# Patient Record
Sex: Male | Born: 1990 | Race: White | Hispanic: No | Marital: Single
Health system: Midwestern US, Community
[De-identification: ages and names within clinical notes are randomized; demographics above are authoritative.]

## PROBLEM LIST (undated history)

## (undated) DIAGNOSIS — F1910 Other psychoactive substance abuse, uncomplicated: Secondary | ICD-10-CM

## (undated) DIAGNOSIS — F319 Bipolar disorder, unspecified: Secondary | ICD-10-CM

## (undated) DIAGNOSIS — F1010 Alcohol abuse, uncomplicated: Secondary | ICD-10-CM

## (undated) MED FILL — FLUOXETINE HCL 20MG CAPS: 20 MG | 30 days supply | Qty: 30 | Fill #0 | Status: AC

## (undated) MED FILL — TRAZODONE HCL 150MG TABS: 150 MG | 30 days supply | Qty: 30 | Fill #0 | Status: AC

---

## 2018-07-07 ENCOUNTER — Inpatient Hospital Stay
Admission: EM | Admit: 2018-07-07 | Discharge: 2018-07-13 | Disposition: A | Discharge status: Home / Self Care | Payer: MEDICAID | Admitting: Addiction Medicine

## 2018-07-07 DIAGNOSIS — F1010 Alcohol abuse, uncomplicated: Principal | ICD-10-CM

## 2018-07-07 NOTE — ED Notes (Signed)
[facility] at bedside. Pt transported in stable condition via stretcher to M7E 2732.      Clide Dalesanielle M. Hinton DyerClifford, RN  07/07/18 2224

## 2018-07-07 NOTE — ED Notes (Signed)
[facility] p/u 2230     Olivia Mackie, RN  07/07/18 2148

## 2018-07-07 NOTE — ED Notes (Signed)
ACC talked with patient to offer support of peer recovery coach.  Patient would like a couch however he is being transferred at this time [facility].  Informed patient that his number would given to the coach to be in contact after he is out of detox. Patient agreed.  I left pamphlet for patient.     Leonette MostBrenda S Shakirah Kirkey, RN  07/07/18 [phone]

## 2018-07-07 NOTE — ED Notes (Signed)
Patient provided with gowns and non-slip socks. Belongings placed in bag, security at bedside to wand and go through belongings.      Olivia MackieAnn Samiyah Stupka, RN  07/07/18 2120

## 2018-07-07 NOTE — ED Provider Notes (Signed)
Emergency Department Encounter  [facility]    Patient: Bradley Calhoun  MRN: [id number]  DOB: 07/15/1991  Date of Evaluation: 07/07/2018  ED Supervising Physician: Valente DavidHEATHER R Karelly Dewalt, MD    I independently examined and evaluated Bradley Standshristopher Calhoun.    In brief, Bradley Calhoun is a 27 y.o. male that presents to the emergency department requesting alcohol detox.  Several months ago patient did have 14 day stay [facility] [place].  However they did not have any additional support set up upon discharge and he did relapse quickly.  Patient feels like things are "getting on hand" he does need to drink just to keep withdrawal symptoms away and to help focus his mind.  He does want to stop.  He is not suicidal or homicidal.  Has heard that the inpatient program at [facility] is very supportive and does have additional outpatient services once discharged which he does want.    Focused exam: Awake and alert.  Mild conjunctival injection mentating appropriately.  Not suicidal or homicidal.  Good insight.  Benign abdominal exam.    Brief ED course/MDM: Patient is currently medically clear for inpatient alcohol detox.  No current signs of withdrawal.  Mildly hypertensive.  We will continue to watch.  [facility].    All diagnostic, treatment, and disposition decisions were made by myself in conjunction with the APP. For all further details of the patient's emergency department visit, please see their documentation.    (Please note that portions of this note may have been completed with a voice recognition program. Efforts were made to edit the dictations but occasionally words are mis-transcribed.)    Valente DavidHEATHER R Chrislynn Mosely, MD  [employer]      Valente DavidHeather R Katricia Prehn, MD  07/07/18 2059

## 2018-07-07 NOTE — Progress Notes (Signed)
Dr. Tamsen RoersVellanki has been made aware of patients BP readings and current home medication list, Lisinopril & Mucinex.   Dr. Tamsen RoersVellanki gave no new orders "patient does not need any medication right now".

## 2018-07-07 NOTE — Progress Notes (Signed)
[facility] from drinking 2, 5th Black Velvet and 3-4, 25 oz. Ice House beers and occasionally $100-200 cocaine daily and that his last drink was Monday 11/11.  Patient reports he has not used Heroin/Fentanyl or pain pills for 3 months.    Patient reports he has Hypertension and has a prescription from [facility], Lisinipril 10 mg. Daily and does not remember the last time taken.  Patient also received a prescription for Mucinex 600 mg ER for Sinuses.  Both medications were [facility] 1 [facility].  Patient thought he was getting the Flu and that is why he [facility].  Patient cooperative with admission process, all forms signed, oriented to room/unit.  Patient denies seizures/ SI/HI.  Patient reports that in February received his [facility].  Patient reports he was in a shelter and was directed to [place] if behavioral issues would arise.

## 2018-07-07 NOTE — ED Notes (Signed)
[facility] to [facility] on [facility].      Olivia MackieAnn Keesha Pellum, RN  07/07/18 2144

## 2018-07-07 NOTE — ED Provider Notes (Signed)
[facility]  eMERGENCY dEPARTMENT eNCOUnter      Pt Name: Bradley Calhoun  MRN: [id number]  Birthdate 01/14/1991  Date of evaluation: 07/07/2018  Provider: Rollene FareAndrew, Saren Corkern A., APRN - CNP     This patient was evaluated under the supervision of Dr. Roseanne RenoGodale    CHIEF COMPLAINT       Chief Complaint   Patient presents with   ??? Alcohol Problem     Pt A&Ox3. Pt requesting detox from alcohol. Pt states he normally drinks 2 fifths daily and his last drink was 36 hours ago. Pt denies hx of seizures with detox. Pt c/o shakes and paranoia at this time.          HISTORY OF PRESENT ILLNESS   (Location/Symptom, Timing/Onset,Context/Setting, Quality, Duration, Modifying Factors, Severity) Note limiting factors.   HPI    Bradley Calhoun is a 27 y.o. male who presents to the emergency department via private vehicle requesting inpatient admission for alcohol detox. He states that he drinks approximately 2 fifth's of whiskey per day and has been doing so since he was 15. He reports that he has been to detox before, his last time was February of this year. He [place] a facility in [place]. He states that his last drink was yesterday. He denies any history of seizures. He denies any symptoms of DTs at this time. He also admits to illicit cocaine use. His last use was 2 days ago. He denies chest pain, shortness of breath, nausea, vomiting, fever, chills, abdominal pain, change in bowel or bladder habits.   This patient's PMH is significant for HTN. She denies any past surgical history.  The patient denies any significant family history.  The patient is a daily smoker. He is a daily drinker, and he does admit to illicit cocaine use.  He lives at home by himself.  I have reviewed the patient's personal and family past medical history as well as the nurse's notes and I agree. Personal history and family past medical history as listed in this chart.  I havereviewed the patient's vitals and agree.    REVIEW OF SYSTEMS    (2+ forlevel  4; 10+ for level 5)   Review of Systems   Constitutional: Negative for chills, fatigue and fever.   HENT: Negative for ear pain, rhinorrhea and sore throat.    Eyes: Negative for visual disturbance.   Respiratory: Negative for cough and shortness of breath.    Cardiovascular: Negative for chest pain and leg swelling.   Gastrointestinal: Negative for abdominal pain, diarrhea, nausea and vomiting.   Endocrine: Negative for cold intolerance and heat intolerance.   Genitourinary: Negative for difficulty urinating, dysuria, flank pain and urgency.   Musculoskeletal: Negative for back pain, joint swelling and neck pain.   Skin: Negative for pallor, rash and wound.   Allergic/Immunologic: Negative for environmental allergies and immunocompromised state.   Neurological: Negative for dizziness, weakness and headaches.   Psychiatric/Behavioral: Negative for confusion and sleep disturbance.     This patient's personal and family past medical history as stated in HPI and otherwise unremarkable.  ROS as stated in HPI otherwise unremarkable,a total of 10 systems reviewed.    PAST MEDICAL HISTORY     Past Medical History:   Diagnosis Date   ??? Hypertension        SURGICAL HISTORY     History reviewed. No pertinent surgical history.    CURRENT MEDICATIONS       Previous Medications    No  medications on file       ALLERGIES     Patient has no known allergies.    FAMILY HISTORY     History reviewed. No pertinent family history.     SOCIAL HISTORY       Social History     Socioeconomic History   ??? Marital status: Single     Spouse name: None   ??? Number of children: None   ??? Years of education: None   ??? Highest education level: None   Occupational History   ??? None   Social Needs   ??? Financial resource strain: None   ??? Food insecurity:     Worry: None     Inability: None   ??? Transportation needs:     Medical: None     Non-medical: None   Tobacco Use   ??? Smoking status: Current Every Day Smoker   ??? Smokeless tobacco: Never Used    Substance and Sexual Activity   ??? Alcohol use: None   ??? Drug use: None   ??? Sexual activity: None   Lifestyle   ??? Physical activity:     Days per week: None     Minutes per session: None   ??? Stress: None   Relationships   ??? Social connections:     Talks on phone: None     Gets together: None     Attends religious service: None     Active member of club or organization: None     Attends meetings of clubs or organizations: None     Relationship status: None   ??? Intimate partner violence:     Fear of current or ex partner: None     Emotionally abused: None     Physically abused: None     Forced sexual activity: None   Other Topics Concern   ??? None   Social History Narrative   ??? None       SCREENINGS           PHYSICAL EXAM    (up to 7 for level 4, 8 or more for level 5)     ED Triage Vitals [07/07/18 1827]   BP Temp Temp Source Pulse Resp SpO2 Height Weight   (!) 152/101 98.3 ??F (36.8 ??C) Oral 91 18 96 % 5\' 8"  (1.727 m) 120 lb (54.4 kg)       Physical Exam  Vitals signs and nursing note reviewed.   Constitutional:       General: He is not in acute distress.     Appearance: Normal appearance. He is normal weight. He is not ill-appearing or toxic-appearing.   HENT:      Head: Normocephalic and atraumatic.   Eyes:      General: No scleral icterus.  Neck:      Musculoskeletal: Normal range of motion and neck supple. No neck rigidity or muscular tenderness.      Vascular: No carotid bruit.   Cardiovascular:      Rate and Rhythm: Normal rate and regular rhythm.      Pulses: Normal pulses.      Heart sounds: Normal heart sounds. No murmur. No friction rub. No gallop.    Pulmonary:      Effort: Pulmonary effort is normal. No respiratory distress.      Breath sounds: Normal breath sounds. No stridor. No wheezing, rhonchi or rales.   Chest:      Chest wall: No tenderness.   Abdominal:  General: There is no distension.      Palpations: Abdomen is soft.      Tenderness: There is no tenderness.   Musculoskeletal: Normal range  of motion.   Lymphadenopathy:      Cervical: No cervical adenopathy.   Skin:     General: Skin is warm and dry.      Capillary Refill: Capillary refill takes less than 2 seconds.   Neurological:      Mental Status: He is alert and oriented to person, place, and time.   Psychiatric:         Mood and Affect: Mood normal.         Behavior: Behavior normal.         LABS:  Labs Reviewed   COMPREHENSIVE METABOLIC PANEL - Abnormal; Notable for the following components:       Result Value    CO2 32 (*)     Glucose 136 (*)     AST 48 (*)     All other components within normal limits    Narrative:     Test Performed by [facility], [address] [place], [place]  [address]   URINE DRUG SCREEN    Narrative:     Test Performed by [facility], [address] [place], [place]  [address]   ETHANOL    Narrative:     Test Performed by [facility], [address], [place], [place]  [address]   CBC    Narrative:     Test Performed by [facility], [address] [place], [place]  [address]        All other labs were within normal range or not returned as of this dictation.    EMERGENCY DEPARTMENT COURSE and DIFFERENTIAL DIAGNOSIS/MDM:   Vitals:    Vitals:    07/07/18 1827   BP: (!) 152/101   Pulse: 91   Resp: 18   Temp: 98.3 ??F (36.8 ??C)   TempSrc: Oral   SpO2: 96%   Weight: 54.4 kg (120 lb)   Height: 5\' 8"  (1.727 m)       Medications - No data to display    MDM  His past medical history, past surgical history, and history of present illness were obtained from the patient himself, nursing staff, and through chart review. Basic lab work and urine drug she really obtained. The plan will be to medically clear the patient and admit for detox.  Labwork does not reveal any evidence of leukocytosis. His Hemoccult is stable at 15.6. There is no evidence of renal insufficiency, LFTs are within normal limits. Glucose is 136. Ethanol level is less than 0.010. Urine drug screen was positive for cocaine.  He was given a meal tray here in the  ED.  2035 I spoke with Dr. Tamsen Roers from detox, who stated that he would accept the patient under his service.  He does not appear toxic or septic. He remained stable throughout his course in the emergency department with blood pressure 152/101, heart rate of 91, temperature 98.3, respirations 18, pulse ox 96 percent on room air. The patient does have history of hypertension, and is not currently taking any of his medications. His headache, change in vision, weakness, slurred speech, paresthesias.  He was transferred to [facility] in stable condition. All of his questions were answered to the best of my ability.    REVAL:  Comment: Please note this report has been produced using speech recognition software and may contain errors related to that system including errors in grammar, punctuation, and spelling, as wellas words and phrases that may be inappropriate.  If there are any questions or concerns please feel free to contact the dictating provider for clarification.      PROCEDURES:  Unless otherwise noted below, none     Procedures    FINAL IMPRESSION      1. Alcohol abuse    2. Current smoker    3. Nondependent cocaine abuse [facility])          DISPOSITION/PLAN   DISPOSITION Admitted 07/07/2018 08:37:37 PM      PATIENT REFERRED TO:  No follow-up provider specified.    DISCHARGE MEDICATIONS:  New Prescriptions    No medications on file          (Please note:  Portions of this note werecompleted with a voice recognition program.  Efforts were made to edit the dictations but occasionally words and phrases are mis-transcribed.)  Form [id number]    Rollene Fare., APRN - CNP (electronically signed)  Emergency Medicine Provider         Gerome Apley. Greig Castilla, APRN - CNP  07/07/18 2106

## 2018-07-08 LAB — COMPREHENSIVE METABOLIC PANEL
ALT: 38 U/L (ref 13–69)
AST: 48 U/L — ABNORMAL HIGH (ref 15–46)
Albumin,Serum: 4.2 g/dL (ref 3.5–5.0)
Alkaline Phosphatase: 44 U/L (ref 38–126)
Anion Gap: 6 NA
BUN: 14 mg/dL (ref 7–20)
CO2: 32 mmol/L — ABNORMAL HIGH (ref 22–30)
Calcium: 9.6 mg/dL (ref 8.4–10.4)
Chloride: 101 mmol/L (ref 98–107)
Creatinine: 0.7 mg/dL (ref 0.52–1.25)
Glucose: 136 mg/dL — ABNORMAL HIGH (ref 70–100)
Potassium: 4.5 mmol/L (ref 3.5–5.1)
Sodium: 139 mmol/L (ref 135–145)
Total Bilirubin: 0.5 mg/dL (ref 0.2–1.3)
Total Protein: 7.1 g/dL (ref 6.3–8.2)
eGFR AA: >60 mL/min (ref >60)
eGFR NON-AA: >60 mL/min (ref >60)

## 2018-07-08 LAB — CBC
Hematocrit: 45.7 % (ref 40.0–52.0)
Hemoglobin: 15.6 g/dL (ref 13.0–18.0)
MCH: 33 pg (ref 26.0–34.0)
MCHC: 34.2 % (ref 32.0–36.0)
MCV: 96.6 fL (ref 80.0–98.0)
MPV: 7.9 fL (ref 7.4–10.4)
Platelets: 295 10*3/uL (ref 140–440)
RBC: 4.73 10*6/uL (ref 4.40–5.90)
RDW: 13.9 % (ref 11.5–14.5)
WBC: 5.7 10*3/uL (ref 3.6–10.7)

## 2018-07-08 LAB — URINE DRUG SCREEN
Amphetamine, Urine: NEGATIVE NA
Barbiturates, Urine: NEGATIVE NA
Benzodiazepine Screen, Urine: NEGATIVE NA
Cocaine Metabolite, Urine: POSITIVE NA
Methadone, Urine: NEGATIVE NA
Opiates, Urine: NEGATIVE NA
Oxycodone Screen, Ur: NEGATIVE NA
Phencyclidine, Urine: NEGATIVE NA

## 2018-07-08 LAB — ETHANOL: Ethanol Lvl: <0.01 g/dL (ref 0.000–0.010)

## 2018-07-08 MED ORDER — LOPERAMIDE HCL 2 MG PO CAPS
2 MG | Freq: Four times a day (QID) | ORAL | Status: DC | PRN
Start: 2018-07-08 — End: 2018-07-13

## 2018-07-08 MED ORDER — FOLIC ACID 1 MG PO TABS
1 MG | Freq: Every day | ORAL | Status: DC
Start: 2018-07-08 — End: 2018-07-13
  Administered 2018-07-08 – 2018-07-13 (×6): 1 mg via ORAL

## 2018-07-08 MED ORDER — ALUM & MAG HYDROXIDE-SIMETH 200-200-20 MG/5ML PO SUSP
200-200-20 MG/5ML | Freq: Four times a day (QID) | ORAL | Status: DC | PRN
Start: 2018-07-08 — End: 2018-07-13
  Administered 2018-07-08 – 2018-07-13 (×2): 30 mL via ORAL

## 2018-07-08 MED ORDER — HYDROXYZINE PAMOATE 50 MG PO CAPS
50 MG | Freq: Four times a day (QID) | ORAL | Status: DC | PRN
Start: 2018-07-08 — End: 2018-07-13
  Administered 2018-07-08 – 2018-07-13 (×7): 50 mg via ORAL

## 2018-07-08 MED ORDER — PHENOBARBITAL 97.2 MG PO TABS
97.2 MG | ORAL | Status: DC
Start: 2018-07-08 — End: 2018-07-10
  Administered 2018-07-08 – 2018-07-10 (×16): 97.2 mg via ORAL

## 2018-07-08 MED ORDER — VITAMIN B-1 100 MG PO TABS
100 MG | Freq: Every day | ORAL | Status: DC
Start: 2018-07-08 — End: 2018-07-13
  Administered 2018-07-08 – 2018-07-13 (×6): 100 mg via ORAL

## 2018-07-08 MED ORDER — LISINOPRIL 10 MG PO TABS
10 MG | Freq: Every day | ORAL | Status: DC
Start: 2018-07-08 — End: 2018-07-13
  Administered 2018-07-08 – 2018-07-13 (×6): 10 mg via ORAL

## 2018-07-08 MED ORDER — TRAZODONE HCL 100 MG PO TABS
100 MG | Freq: Every evening | ORAL | Status: DC | PRN
Start: 2018-07-08 — End: 2018-07-13
  Administered 2018-07-08 – 2018-07-13 (×5): 100 mg via ORAL

## 2018-07-08 MED ORDER — NICOTINE 21 MG/24HR TD PT24
21 MG/24HR | Freq: Every day | TRANSDERMAL | Status: DC
Start: 2018-07-08 — End: 2018-07-13
  Administered 2018-07-08 – 2018-07-13 (×6): 1 via TRANSDERMAL

## 2018-07-08 MED ORDER — ONDANSETRON HCL 8 MG PO TABS
8 MG | Freq: Three times a day (TID) | ORAL | Status: DC | PRN
Start: 2018-07-08 — End: 2018-07-13
  Administered 2018-07-08: 23:00:00 4 mg via ORAL

## 2018-07-08 MED ORDER — THERAPEUTIC MULTIVIT/MINERAL PO TABS
Freq: Every day | ORAL | Status: DC
Start: 2018-07-08 — End: 2018-07-13
  Administered 2018-07-08 – 2018-07-13 (×6): 1 via ORAL

## 2018-07-08 MED ORDER — IBUPROFEN 400 MG PO TABS
400 MG | Freq: Four times a day (QID) | ORAL | Status: DC | PRN
Start: 2018-07-08 — End: 2018-07-13
  Administered 2018-07-08 – 2018-07-13 (×5): 400 mg via ORAL

## 2018-07-08 MED ORDER — MAGNESIUM HYDROXIDE 400 MG/5ML PO SUSP
400 MG/5ML | Freq: Every day | ORAL | Status: DC | PRN
Start: 2018-07-08 — End: 2018-07-13

## 2018-07-08 MED FILL — VITAMIN B-1 100 MG PO TABS: 100 mg | ORAL | Qty: 1

## 2018-07-08 MED FILL — NICOTINE 21 MG/24HR TD PT24: 21 mg/(24.h) | TRANSDERMAL | Qty: 1

## 2018-07-08 MED FILL — PHENOBARBITAL 97.2 MG PO TABS: 97.2 mg | ORAL | Qty: 1

## 2018-07-08 MED FILL — LISINOPRIL 10 MG PO TABS: 10 mg | ORAL | Qty: 1

## 2018-07-08 MED FILL — FOLIC ACID 1 MG PO TABS: 1 mg | ORAL | Qty: 1

## 2018-07-08 MED FILL — HYDROXYZINE PAMOATE 50 MG PO CAPS: 50 mg | ORAL | Qty: 1

## 2018-07-08 MED FILL — IBUPROFEN 400 MG PO TABS: 400 mg | ORAL | Qty: 1

## 2018-07-08 MED FILL — THERA-M PO TABS: ORAL | Qty: 1

## 2018-07-08 MED FILL — TRAZODONE HCL 100 MG PO TABS: 100 mg | ORAL | Qty: 1

## 2018-07-08 MED FILL — ONDANSETRON HCL 8 MG PO TABS: 8 mg | ORAL | Qty: 1

## 2018-07-08 NOTE — H&P (Signed)
Chemical Dependency (Addiction Medicine)  [facility]  H&P              Admit Date: 07/07/2018  Primary Care Physician: No primary care provider on file.  MRN: [[id number]]     Chief Complaint   Patient presents with   ??? Alcohol Problem     Pt A&Ox3. Pt requesting detox from alcohol. Pt states he normally drinks 2 fifths daily and his last drink was 36 hours ago. Pt denies hx of seizures with detox. Pt c/o shakes and paranoia at this time.        Admit Date: 07/07/2018   Primary Care Physician: No primary care provider on file.  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 *    History of Present Illness:  Bradley Calhoun is a 27 y.o. year old male with a PMH of Htn.     Bradley Calhoun states that he is here to detox from ETOH.  He admits to using crack cocaine and ETOH daily but states he can control the cocaine use but not the ETOH. Patient states he has been drinking daily since 2015.  He reports drinking fifth of etoh  x2 daily and ~ $100 + of crack cocaine daily for the last 3 months. He denies hx of seizure but admits to hallucinations, shakes, and anxiety (DT's). Patient reports his motivation to stop is primarily driven by his family who has stopped speaking to him.  Patient reports he would like to get clean then go back home to [place] where all of his family lives. Patient is currently living with a friend who sells drugs.  He reports seeking a long term facility and reports hx of getting treatment in long term facilities in past and started drinking immediately once discharged.    On admission, a urine drug screen was positive for cocaine, and a serum alcohol level was negative.    The last use of Etoh and crack cocaine was 1am or 2am 07/06/18.     Substance Use History:  Consequences  27  years old at first use of 75.  By 8 or 74 patient reports drinking daily and getting into trouble socially and with the law  '[x]'  IVDA ( occasionally in arms and hands)  '[x]'  Blackouts related to substance use  '[]'  History of withdrawal seizures denies  '[x]'   History of delirium tremens  '[]'  History of overdoses denies  '[x]'  Legal consequences of substance use - admits to being in jail > 10 times for ETOH associated offenses    Current Substance Use   '[x]'  ETOH:  Fifth x 2 daily since 2015  '[]'  BENZOS: no currently but (+) hx  '[x]'  COCAINE: crack > $100 worth daily  '[]'  AMPHETAMINES: no currently but (+) extensive hx, last use 2 yrs ago  '[]'  OPIATES: no currently but (+) hx fentanyl patches, shot up 3 months ago and passed out so stopped  '[]'  MARIJUANA: no currently but (+) hx. Stopped at age of 71  '[x]'  TOBACCO: 1-1.5pks/day    Treatment History    Longest period of sobriety since daily use began is 90 days but started drinking as soon as he was discharged.  '[x]'  INPATIENT REHABILATATION[facility]  in [place] - started drinking again day of discharge  '[]'  CHEMICAL DEPENDENCY IOP: [facility] ( d/c because of possession etoh), [facility] ( 1-[facility] but was still getting high and drinking daily)  '[x]'  [facility] [place] [facility] [place] (d'ced because of possessing etoh  '[]'  12 STEP MEETINGS: [place] says he has somewhat of a sponsor.  '[x]'  MEDICATION ASSISTED TREATMENT: [facility] ( dismissed carrying ETOH), [facility](1-[facility] months but was still getting high and drinking daily)    Substance Use Disorder Criteria  2-3 = mild; 4-5 = moderate; 6 or >6 = severe substance use disorder   '[x]'  Taking substance in larger amounts and/or for longer than intended  '[x]'  Wanting to cut down or quit but not being able to  '[x]'  Spending a lot of time obtaining the substance  '[x]'  Craving or a strong desire to use substance  '[x]'  Repeatedly doesn't carry out major obligations due to  substance use  '[x]'  Using despite recurring social or interpersonal problems  '[x]'  Reducing social, occupational, or recreational activities  '[x]'  Recurrent use in physically hazardous situations  '[x]'  Consistent use despite recurrent physical or psychological difficulties  '[x]'  Tolerance (increased amounts to achieve intoxication or diminished effect)  '[x]'  Withdrawal syndrome or the substance is used to avoid withdrawal    Psychiatric History:  Current psychiatrist: no  Current medications: See med list below  Previous psychiatrist: does not remember name  Previous medication trials: celexa ( didn't work)  Diagnoses: Patient reports mania (( 19or 27yo) but denies hx of Bipolar diagnosis  '[]'  Psychiatric hospitalizations: none  '[]'  Previous suicide attempts: no  '[]'  Adverse childhood events: no  '[]'  Trauma history: says no but father, grandfather and 2 uncles have Etoh disorder and everyone continues to drink daily except one of the uncles  '[]'  History of head injuries: no    Remaining History:  Social History     Socioeconomic History   ??? Marital status: Single     Spouse name: Not on file   ??? Number of children: Not on file   ??? Years of education: Not on file   ??? Highest education level: Not on file   Occupational History   ??? Not on file   Social Needs   ??? Financial resource strain: Not on file   ??? Food insecurity:     Worry: Not on file     Inability: Not on file   ??? Transportation needs:     Medical: Not on file     Non-medical: Not on file   Tobacco Use   ??? Smoking status: Current Every Day Smoker   ??? Smokeless tobacco: Never Used   Substance and Sexual Activity   ??? Alcohol use: Yes   ??? Drug use: Not on file   ??? Sexual activity: Not on file   Lifestyle   ??? Physical activity:     Days per week: Not on file     Minutes per session: Not on file   ??? Stress: Not on file   Relationships   ??? Social connections:     Talks on phone: Not on file     Gets together: Not on file     Attends religious service: Not on file     Active  member of club or organization: Not on file     Attends meetings of clubs or organizations: Not on file     Relationship status: Not  on file   ??? Intimate partner violence:     Fear of current or ex partner: Not on file     Emotionally abused: Not on file     Physically abused: Not on file     Forced sexual activity: Not on file   Other Topics Concern   ??? Not on file   Social History Narrative   ??? Not on file     Past Medical History:   Diagnosis Date   ??? Hypertension      History reviewed. No pertinent surgical history.  History reviewed. No pertinent family history.    ROS:  Review of Systems  Physical Exam:  Vitals:    07/07/18 2246 07/08/18 0010 07/08/18 0517 07/08/18 1009   BP: (!) 137/104 129/84 122/83 122/88   Pulse: 74 71 74 81   Resp: '18 16 16    ' Temp: 98.8 ??F (37.1 ??C) 98.1 ??F (36.7 ??C) 97.8 ??F (36.6 ??C)    TempSrc: Temporal Temporal Oral Temporal   SpO2: 98% 97% 95%    Weight:       Height:         General appearance: alert, cooperative  Eyes: negative  Lungs: clear to auscultation bilaterally  Heart: regular rate and rhythm, S1, S2 normal, no murmur, click, rub or gallop  Abdomen: abnormal findings:  tenderness mild in the LUQ  Neurologic: Alert and oriented X 3, normal strength and tone. Normal symmetric reflexes.   Mental status: Alert, oriented, thought content appropriate  Gait: Normal  Pulses: 2+ and symmetric    Imaging:    No results found.    Labs:  Recent Results (from the past 48 hour(s))   Comprehensive Metabolic Panel    Collection Time: 07/07/18  6:38 PM   Result Value Ref Range    Sodium 139 135 - 145 mmol/L    Potassium 4.5 3.5 - 5.1 mmol/L    Chloride 101 98 - 107 mmol/L    CO2 32 (H) 22 - 30 mmol/L    Anion Gap 6 NA    Glucose 136 (H) 70 - 100 mg/dL    BUN 14 7 - 20 mg/dL    CREATININE 0.70 0.52 - 1.25 mg/dL    eGFR African American >60.0 >60 mL/min    EGFR IF NonAfrican American >60.0 >60 mL/min    Calcium 9.6 8.4 - 10.4 mg/dL    Albumin,Serum 4.2 3.5 - 5.0 g/dL    Total Protein 7.1 6.3  - 8.2 g/dL    Total Bilirubin 0.5 0.2 - 1.3 mg/dL    Alkaline Phosphatase 44 38 - 126 U/L    ALT 38 13 - 69 U/L    AST 48 (H) 15 - 46 U/L   Urine Drug Screen    Collection Time: 07/07/18  6:38 PM   Result Value Ref Range    Amphetamines, urine Negative NA    Barbiturates, Ur Negative NA    Benzodiazepine Ur Qual Negative NA    Cocaine Metabolites, Ur Positive NA    Methadone, Urine Negative NA    Opiates, Urine Negative NA    Oxycodone Screen, Ur Negative NA    PCP, Urine Negative NA   Ethanol    Collection Time: 07/07/18  6:38 PM   Result Value Ref Range    Ethanol Lvl <0.010 0.000 - 0.010 g/dL   Hemogram (CBC)    Collection Time: 07/07/18  6:38 PM   Result Value Ref Range    WBC 5.7 3.6 -  10.7 10*3/uL    RBC 4.73 4.40 - 5.90 10*6/uL    Hemoglobin 15.6 13.0 - 18.0 g/dL    Hematocrit 45.7 40.0 - 52.0 %    MCV 96.6 80.0 - 98.0 fL    MCH 33.0 26.0 - 34.0 pg    MCHC 34.2 32.0 - 36.0 %    RDW 13.9 11.5 - 14.5 %    Platelets 295 140 - 440 10*3/uL    MPV 7.9 7.4 - 10.4 fL       Medications:  Medications Prior to Admission: lisinopril (PRINIVIL;ZESTRIL) 10 MG tablet, Take 10 mg by mouth daily    ??? lisinopril  10 mg Oral Daily   ??? folic acid  1 mg Oral Daily   ??? PHENobarbital  97.2 mg Oral Q4H   ??? therapeutic multivitamin-minerals  1 tablet Oral Daily   ??? thiamine  100 mg Oral Daily     aluminum & magnesium hydroxide-simethicone, hydrOXYzine, ibuprofen, loperamide, magnesium hydroxide, ondansetron, traZODone    Impression:  ETOH disorder currently withdrawing from ETOH  Cocaine abuse  Extensive hx of other substance abuse including opiates, benzo's, meth but denies recent use  HTN  Tobacco Use disorder    Plan:  Phenobarb taper  97.2 q4 hours for today.  This dose will be tapered according to clinical signs and symptoms.   PRN medications for withdrawal symptoms added.  Monitoring withdrawal symptoms with CIWA scores per unit protocol.  Continue lisinopril 69m/day  Nicotine patch 274m Encouraged to participate in all unit  activities.  The patient will be discharged when stable with an active plan of recovery- ASAM level care recommended 3.5    MyBayard HuggerMD  Addiction Medicine  07/08/2018 at 10:11 AM    I spent over 51% of total time 50 minutes counseling or coordinating care regarding patient's chemical dependency status.

## 2018-07-08 NOTE — Discharge Instructions (Signed)
   After detox you should abstain from any use of any mood altering chemical   Appointment with your primary care physician should be scheduled   It is highly recommended that you attend post hospital treatment   Please read the information give to you - Intro to 12 step programs   Call the National Suicide Prevention Hotline if needed at: 1-800-273-TALK (8255)     Please call the following number should you have questions regarding your discharge or aftercare appointments:   [address]   [phone]

## 2018-07-08 NOTE — Other (Signed)
27 yo single male admitted for alcohol dependence and withdrawal. Pt has a hx of alcohol use dating back many years. Pt reports he first started drinking at age 27, and his use was problematic right away. Prior to this admission pt was drinking 2/5 of liquor and 3-4, 25oz beers daily. He also has been using $100-200 worth of crack cocaine daily. He has a hx of daily opiate use but stopped using 3 months ago.  Pt has [facility] [place], [facility] [place] [facility] [place]. He reports he also [facility] and followed up with their IOP. Pt has been on [facility] and [facility].     Pt is currently living in [place] with a friend who pt reports is dealing. Pt understands he is at high risk of relapse if he returns to this environment. He has no family support in the area, he reports his entire family lives in [place]. Pt is unemployed. He has no current legal issues.    At discharge pt is interested in residential treatment and would benefit from this level of care. As he stabilizes, SW will discuss tx options with him and assist him in developing a recovery plan.    Social work assessment completed.  See flowsheet below for additional assessment information.     07/08/18 1001   Psychiatric History   Psychiatric history treatment Other  (Pt reports previous psychiatric hospitalization as an adolescent for mania. Denies current treatment or mental health sx. Denies a hx of SA or suicidal ideations)   Support System   Problems in support system Isolated   Current Living Situation   Home Living Adequate   Living information Lives with others  (lives with a friend)   Problems with living situation  Yes  (pts roommate is a Consulting civil engineerdealer, pt understands he is at high risk of relapse if he returns to this living environment)   Other government assistance [place] [employer]   Problems with environment No   Medical and  Self-Care Issues   Relevant medical problems HTN   Barriers to treatment Y  (No PCP, will be given [facility])   Family Constellation   Spouse/partner-name/age single   Children-names/ages no children   Parents both living in [place]   Siblings 1 sister   Childhood   Raised by Biological parent   Relevant family history Pt was born and raised in [place], he reports [place] moved to the [place] area because of his drug use and trying to escape his using environment there.   History of abuse No   Comment Trauma hx pertinent for the death of pt's son to SIDs in 2014   Legal History   Legal history Yes  (alcohol related charges)   Current charges No   Pick-up order  No   Restraining order No   Sentence pending No   Domestic violence charges No   Homicidal threats or behaviors No   Duty to warn No   Probation/parole No   Juvenile legal history No    Abuse Assessment   Physical Abuse Denies   Verbal Abuse Denies   Emotional Abuse Denies   Financial Abuse Denies   Sexual Abuse Denies   Substance Use   Use of substances  Yes   Substance 1   Substance used Alcohol   Amount/frequency/route 2/5 of liquor and 3-4, 25oz cans of  beer daily   Age of  first use 36   Last use 07/06/18   Substance 2   Substance used Cocaine   Amount/frequency/route $100-200 of crack cocaine daily   Last use 07/06/18   Substance 3   Substance used  Opiates   Amount/frequency/route Hx of use   Last use 3 months ago   Motivation for SA Treatment   Stage of engagement Pre-engagement/engagement   Motivation for treatment Yes  (interested in residential tx)   Education   Education Other (comment)  (10th)   Work History   Currently employed No   Financial planner   (none)   Leisure/Activity   Present interests fishing, walking   Social with friends/family Yes   Cultural and Spiritual   Spiritual concerns No   Cultural concerns No   Electronically signed by [name], LISW on 07/08/2018 at 2:09 PM

## 2018-07-08 NOTE — Progress Notes (Signed)
Nutrition Assessment    Type and Reason for Visit: Initial    Malnutrition Assessment:  ?? Malnutrition Status: At risk for malnutrition  ?? Context: Social or environmental circumstances(PTA drank ETOH, had appetite, but, no motivation to prepare meals or eat them, found self skipping meals and days of eating)  ?? Findings of the 6 clinical characteristics of malnutrition (Minimum of 2 out of 6 clinical characteristics is required to make the diagnosis of moderate or severe Protein Calorie Malnutrition based on AND/ASPEN Guidelines):  1. Energy Intake-Less than or equal to 50% of estimated energy requirement, Greater than or equal to 3 months(x 3 months)    2. Weight Loss-10% loss or greater(estimated 11% loss), in 3 months  3. Fat Loss-No significant subcutaneous fat loss,    4. Muscle Loss-Moderate muscle mass loss, Clavicles (pectoralis and deltoids)  5. Fluid Accumulation-No significant fluid accumulation,    6. Grip Strength-Not measured    Nutrition Diagnosis:   ?? Problem: Altered nutrition-related lab values, Predicted suboptimal energy intake, Increased nutrient needs, In context of social or environmental circumstances, Underweight, Inadequate oral intake(11/12 C02 32, Glu 136, SGOT 48, elevated)  ?? Etiology: related to Psychological cause/life stress, Partial or complete edentulism    ??? Signs and symptoms:  as evidenced by Patient report of, Presence of wounds, Lab values, BMI, Weight loss, Weight loss greater than or equal to 7.5% in 3 months, Intake 25-50%, Moderate muscle loss, Diet history of poor intake(estimated 11% loss of UBW x 3 months)    Nutrition Assessment:  ?? Subjective Assessment: good appetite here, without GI complaints, PTA drank ETOH, had appetite, but, no motivation to prepare meals or eat them, found self skipping meals and days of eating   ?? Nutrition-Focused Physical Findings: good appetite here, without GI complaints, PTA drank ETOH, had appetite, but, no motivation to prepare meals  or eat them, found self skipping meals and days of eating   ?? Wound Type: (abrasion BLE/face)  ?? Current Nutrition Therapies:  ?? Oral Diet Orders: General   ?? Oral Diet intake: 76-100%  ?? Oral Nutrition Supplement (ONS) Orders: None  ?? Anthropometric Measures:  ?? Ht: 5\' 8"  (172.7 cm)   ?? Admission Body Wt: 120 lb (54.4 kg)  ?? Usual Body Wt: 135 lb (61.2 kg)(est. by pt.)  ?? % Weight Change:  ,  estimated 11% loss over 3 months  ?? Ideal Body Wt: 154 lb (69.9 kg), % Ideal Body 78%  ?? BMI Classification: BMI <18.5 Underweight  ?? Comparative Standards (Estimated Nutrition Needs):  ?? Estimated Daily Total Kcal: x 25 - 30 kcals = 1620 - 1890 kcals  ?? Estimated Daily Protein (g): x 1.2 - 1.3 gms protein = 65 - 70 gms protein    Nutrition Risk Level: High    Nutrition Interventions:   Continue current diet, Start ONS(will initiate: 1 Ensure Compact bid lunch/dinner)  Continued Inpatient Monitoring, Education not appropriate at this time    Nutrition Evaluation:   ?? Evaluation: Goals set   ?? Goals: will admit to consuming > 50% of supplement    ?? Monitoring: Meal Intake, Supplement Intake, Skin Integrity, I&O, Mental Status/Confusion, Weight, Pertinent Labs, Monitor Hemodynamic Status    See Adult Nutrition Doc Flowsheet for more detail.     Electronically signed by Magda PaganiniMaureen Kirsi Hugh, RD, LD on 07/08/18 at 2:22 PM    Contact Number: Pager # [phone]

## 2018-07-09 MED FILL — NICOTINE 21 MG/24HR TD PT24: 21 mg/(24.h) | TRANSDERMAL | Qty: 1

## 2018-07-09 MED FILL — TRAZODONE HCL 100 MG PO TABS: 100 mg | ORAL | Qty: 1

## 2018-07-09 MED FILL — PHENOBARBITAL 97.2 MG PO TABS: 97.2 mg | ORAL | Qty: 1

## 2018-07-09 MED FILL — THERA-M PO TABS: ORAL | Qty: 1

## 2018-07-09 MED FILL — HYDROXYZINE PAMOATE 50 MG PO CAPS: 50 mg | ORAL | Qty: 1

## 2018-07-09 MED FILL — FOLIC ACID 1 MG PO TABS: 1 mg | ORAL | Qty: 1

## 2018-07-09 MED FILL — LISINOPRIL 10 MG PO TABS: 10 mg | ORAL | Qty: 1

## 2018-07-09 MED FILL — VITAMIN B-1 100 MG PO TABS: 100 mg | ORAL | Qty: 1

## 2018-07-09 NOTE — Progress Notes (Signed)
Patient withdrawn to unit during shift.  Patient reports he was is sleeping too much, Trazodone was not given.  Patient reports attending meetings, taking in food/fluid and is able to make needs known.

## 2018-07-09 NOTE — Care Coordination-Inpatient (Signed)
A list of sober living, residential tx options and more detailed information about [facility] Calh[facility], [facility], [facility], [facility] and [facility] placed at pt bedside for review. Pt was sleeping at this time, SW will follow up with pt when he is awake to discuss tx programs in detail and develop a recovery plan.  Electronically signed by [name], LISW on 07/09/2018 at 10:08 AM

## 2018-07-09 NOTE — Progress Notes (Signed)
Patient A O x 4, pleasant and cooperative, was up early to order breakfast and ate, attended morning meeting, up ad lib, gait steady, breathe easy and unlabored, denies pain, denies SI/HI/ hallucination, voices anxiety, diaphoresis and feeling hot,  linen and bedding changed. Sleeping in between care, reports being tired, encouraged to make needs known, will support and maintain safety.

## 2018-07-09 NOTE — Progress Notes (Signed)
Chemical Dependency (Addiction Medicine)  Inpatient Progress Note      PATIENT: Bradley Calhoun  MRN: [id number]  Chief Complaint   Patient presents with   ??? Alcohol Problem     Pt A&Ox3. Pt requesting detox from alcohol. Pt states he normally drinks 2 fifths daily and his last drink was 36 hours ago. Pt denies hx of seizures with detox. Pt c/o shakes and paranoia at this time.      Principal Problem:    Alcohol withdrawal syndrome without complication (HCC)  Active Problems:    Severe alcohol use disorder (HCC)    Severe cocaine use disorder (HCC)  Resolved Problems:    * No resolved hospital problems. *    Subjective:  Last 4 CIWA scores per RN assessments: 11 - 9 - 6 - 9    Says he feels "like shit" - numerous ongoing withdrawal symptoms    Committed to inpatient    Says he hasn't used opioids/IV drugs in at least 3 months    Review of Systems   Constitutional: Positive for appetite change and diaphoresis.   Gastrointestinal: Positive for nausea.   Musculoskeletal: Positive for arthralgias.   Neurological: Positive for tremors.   Psychiatric/Behavioral: The patient is nervous/anxious.    All other systems reviewed and are negative.    Objective:  Physical Exam:  Vitals:    07/09/18 0012 07/09/18 0609 07/09/18 0903 07/09/18 1151   BP: 114/77 104/69 129/84 102/72   Pulse: 74 69 93 84   Resp: 16 16 20 20    Temp: 96.8 ??F (36 ??C) 97.6 ??F (36.4 ??C)  98.5 ??F (36.9 ??C)   TempSrc: Temporal Temporal Temporal Temporal   SpO2: 98% 96% 98% 97%   Weight:       Height:         General appearance: alert, fatigued, cooperative, mild distress  Skin: clammy, diaphoretic  Eyes: conjunctivae/corneas clear. PERRL, EOM's intact.   Lungs: clear to auscultation bilaterally  Heart: regular rate and rhythm, S1, S2 normal  Abdomen: soft, non-tender;   Neurologic: Grossly normal  Mental Status:  awake and alert; oriented to person, place, and time  Extremities: extremities warm, atraumatic, no cyanosis or edema; +upper extremity  tremors.  Pulses: 2+ and symmetric    Medications:  ??? lisinopril  10 mg Oral Daily   ??? nicotine  1 patch Transdermal Daily   ??? folic acid  1 mg Oral Daily   ??? PHENobarbital  97.2 mg Oral Q4H   ??? therapeutic multivitamin-minerals  1 tablet Oral Daily   ??? thiamine  100 mg Oral Daily     aluminum & magnesium hydroxide-simethicone, hydrOXYzine, ibuprofen, loperamide, magnesium hydroxide, ondansetron, traZODone     Recent Imaging:  No results found.    Labs:  No results found for this or any previous visit (from the past 24 hour(s)).  CBC:   Recent Labs     07/07/18  1838   WBC 5.7   HGB 15.6   PLT 295   MCV 96.6   RDW 13.9     BMP:    Recent Labs     07/07/18  1838   NA 139   K 4.5   CL 101   CO2 32*   BUN 14   CREATININE 0.70   CALCIUM 9.6     Liver Profile:  Recent Labs     07/07/18  1838   AST 48*   ALT 38   BILITOT 0.5   ALKPHOS 44  LABALBU 4.2   PROT 7.1     Glucose:  Recent Labs     07/07/18  1838   GLUCOSE 136*       Impression:  ETOH disorder currently withdrawing from ETOH  Cocaine abuse  Extensive hx of other substance abuse including opiates, benzo's, meth but denies recent use  HTN  Tobacco Use disorder  ??  Plan:  Phenobarb taper  Continue 97.2 q4 hours for today.  This dose will be tapered according to clinical signs and symptoms.   Thiamine/folic acid.  PRN medications for withdrawal symptoms added.  Monitoring withdrawal symptoms with CIWA scores per unit protocol.  Nicotine patch 21mg   Encouraged to participate in all unit activities.  The patient will be discharged when stable with an active plan of recovery    In evaluating this patient, he clearly meets at least ASAM level 3.5 care requiring inpatient residential treatment.      Lindwood QuaSuman Estela Vinal, MD  Addiction Medicine  07/09/2018 at 5:02 PM    I spent over 51% of total time 25 minutes counseling or coordinating care regarding patient's chemical dependency status.

## 2018-07-10 MED ORDER — PHENOBARBITAL 32.4 MG PO TABS
32.4 MG | Freq: Four times a day (QID) | ORAL | Status: DC
Start: 2018-07-10 — End: 2018-07-11
  Administered 2018-07-10 – 2018-07-11 (×4): 64.8 mg via ORAL

## 2018-07-10 MED ORDER — PHENOBARBITAL 32.4 MG PO TABS
32.4 MG | ORAL | Status: DC
Start: 2018-07-10 — End: 2018-07-10

## 2018-07-10 MED FILL — FOLIC ACID 1 MG PO TABS: 1 mg | ORAL | Qty: 1

## 2018-07-10 MED FILL — PHENOBARBITAL 97.2 MG PO TABS: 97.2 mg | ORAL | Qty: 1

## 2018-07-10 MED FILL — LISINOPRIL 10 MG PO TABS: 10 mg | ORAL | Qty: 1

## 2018-07-10 MED FILL — PHENOBARBITAL 32.4 MG PO TABS: 32.4 mg | ORAL | Qty: 2

## 2018-07-10 MED FILL — THERA-M PO TABS: ORAL | Qty: 1

## 2018-07-10 MED FILL — HYDROXYZINE PAMOATE 50 MG PO CAPS: 50 mg | ORAL | Qty: 1

## 2018-07-10 MED FILL — TRAZODONE HCL 100 MG PO TABS: 100 mg | ORAL | Qty: 1

## 2018-07-10 MED FILL — VITAMIN B-1 100 MG PO TABS: 100 mg | ORAL | Qty: 1

## 2018-07-10 MED FILL — NICOTINE 21 MG/24HR TD PT24: 21 mg/(24.h) | TRANSDERMAL | Qty: 1

## 2018-07-10 NOTE — Progress Notes (Signed)
Patient cooperative with care today.  Attended AM groups.

## 2018-07-10 NOTE — Progress Notes (Signed)
Chief complaint  Chief Complaint   Patient presents with   ??? Alcohol Problem     Pt A&Ox3. Pt requesting detox from alcohol. Pt states he normally drinks 2 fifths daily and his last drink was 36 hours ago. Pt denies hx of seizures with detox. Pt c/o shakes and paranoia at this time.       Seen for follow up of substance dependence and use and to monitor withdrawals    S:   Patient seen and examined. Overall symptoms improving. Continues to want residential tx upon discharge. Feeling much better today.    REVIEW OF SYMPTOMS: Patient denies CP or SOB. Denies audio-, visual or tactile hallucinations. Denies dizziness or visual changes. Denies acute pain. Denies rhinorrhea, lacrimation, cough or sore throat. No difficulty ambulating, urinating, swallowing. Denies rash.    O:  Vitals:    07/10/18 1100   BP: (!) 127/90   Pulse: 87   Resp: 18   Temp: 98.3 ??F (36.8 ??C)   SpO2: 98%     Patient seen and examined. Alert and oriented x 3. NAD.  Head: NCAT.   Skin: warm, dry  Eyes: PERRL. Sclera clear. Conjunctiva clear. No nystagmus.   Tongue: is pink, moist, midline and non-tremulous.  Teeth: no disrepair or obvious caries  B/L Upper extremeties: demonstrate mild tremor of the hands bilaterally; no deformities noted.  Cardio:  no visible edema noted in distal extremities, pedal pulses intact  Chest: moves symmetrically with respiration, no audible wheezes.   CNII-XII are grossly intact. And symmetrical  Sensation appears to be intact in distal extremities and Gait stable.   Insight and judgement are fair. Denies suicidal or homocidal ideation. Cognition intact. Denies audio, visual or tactile hallucinations. Not responding to internal stimuli, good eye contact.      No results found for this or any previous visit (from the past 24 hour(s)).    Impression:  ETOH disorder??currently withdrawing from ETOH  Cocaine abuse  Extensive hx of other substance abuse including opiates, benzo's, meth but denies recent use  HTN  Tobacco Use  disorder  ??  Plan:  Phenobarbital??taper - dose to be tapered according to clinical s/s of withdrawal.  Phenobarbital level in the AM.  Thiamine/folic acid.  PRN medications for withdrawal symptoms added.  Monitoring withdrawal symptoms with CIWA scores per unit protocol.  Nicotine patch 21 mg.  Encouraged to participate in all unit activities.  The patient will be discharged when stable with an active plan of recovery - wants residential tx.  ??  In evaluating this patient, he clearly meets at least ASAM level 3.5 care requiring inpatient residential treatment.        Adonis BrookAlex P Maudie Shingledecker, DO  Addiction Medicine  07/10/2018 at 1:23 PM

## 2018-07-10 NOTE — Care Coordination-Inpatient (Signed)
Spoke with pt about tx after discharge. He reports he is interested in going to [facility], he understands there is likely a wait and is willing to discharge home and follow up with [facility]. Pt did sign an ROI and a referral packet was sent. Pt understands SW will not hear back from [facility] prior to Monday. Discussed additional tx options with pt and will follow up to answer further questions and assist in additional referrals as needed.  Electronically signed by [name], LISW on 07/10/2018 at 4:21 PM

## 2018-07-10 NOTE — Progress Notes (Signed)
Patient up, social, taking medication and is receptive to nursing care.  Patient encouraged to make needs known and is safe on the unit.

## 2018-07-11 LAB — PHENOBARBITAL LEVEL: Phenobarbital: 33.9 ug/mL (ref 15.0–40.0)

## 2018-07-11 MED ORDER — PHENOBARBITAL 32.4 MG PO TABS
32.4 MG | Freq: Four times a day (QID) | ORAL | Status: DC | PRN
Start: 2018-07-11 — End: 2018-07-13
  Administered 2018-07-12: 17:00:00 64.8 mg via ORAL

## 2018-07-11 MED FILL — PHENOBARBITAL 32.4 MG PO TABS: 32.4 mg | ORAL | Qty: 2

## 2018-07-11 MED FILL — HYDROXYZINE PAMOATE 50 MG PO CAPS: 50 mg | ORAL | Qty: 1

## 2018-07-11 MED FILL — IBUPROFEN 400 MG PO TABS: 400 mg | ORAL | Qty: 1

## 2018-07-11 MED FILL — NICOTINE 21 MG/24HR TD PT24: 21 mg/(24.h) | TRANSDERMAL | Qty: 1

## 2018-07-11 MED FILL — LISINOPRIL 10 MG PO TABS: 10 mg | ORAL | Qty: 1

## 2018-07-11 MED FILL — FOLIC ACID 1 MG PO TABS: 1 mg | ORAL | Qty: 1

## 2018-07-11 MED FILL — TRAZODONE HCL 100 MG PO TABS: 100 mg | ORAL | Qty: 1

## 2018-07-11 MED FILL — VITAMIN B-1 100 MG PO TABS: 100 mg | ORAL | Qty: 1

## 2018-07-11 MED FILL — THERA-M PO TABS: ORAL | Qty: 1

## 2018-07-11 NOTE — Progress Notes (Signed)
Patient A O x 4, social, pleasant and cooperative, up ad lib, gait steady, breathe easy and unlabored, c/o tooth ache this morning, denies SI/HI/ hallucination, voices mild anxiety and diaphoresis, encouraged to make needs known, will support and maintain safety.

## 2018-07-11 NOTE — Progress Notes (Signed)
Activity Therapy Group  Number of Participants: 9    Start Time: 1500    Duration: 35 minutes    Type of Group: Music Therapy     Group Name: Musical Scattergories    Group Description: Patients were provided with categories and had to come up with song titles that correspond to the categories. Patients were split into two teams. There was a total of three three-minute rounds and the teams scored two point if they sang part of the song. The winning team picked a song to listen to all the way through.  Patient Response: Patient participated appropriately by collaborating and contributing to team.  Pt was social with peers and participated in singing. Patient was seen laughing and joking appropriately several times. Patient at times struggled to recall song names. Continue to encourage group participation to address stated activity therapy goals and objectives.       Joetta MannersErika Delpin, MTI

## 2018-07-11 NOTE — Progress Notes (Signed)
Chief complaint  Chief Complaint   Patient presents with   ??? Alcohol Problem     Pt A&Ox3. Pt requesting detox from alcohol. Pt states he normally drinks 2 fifths daily and his last drink was 36 hours ago. Pt denies hx of seizures with detox. Pt c/o shakes and paranoia at this time.       Seen for follow up of substance dependence and use and to monitor withdrawals    S:   Patient seen and examined. Overall symptoms improving.    REVIEW OF SYMPTOMS: Patient denies CP or SOB. Denies audio-, visual or tactile hallucinations. Denies dizziness or visual changes. Denies acute pain. Denies rhinorrhea, lacrimation, cough or sore throat. No difficulty ambulating, urinating, swallowing. Denies rash.    O:  Vitals:    07/11/18 1130   BP: 126/86   Pulse: 92   Resp: 18   Temp: 98.4 ??F (36.9 ??C)   SpO2: 96%     Patient seen and examined. Alert and oriented x 3. NAD.  Head: NCAT.   Skin: warm, dry  Eyes: PERRL. Sclera clear. Conjunctiva clear. No nystagmus.   Tongue: is pink, moist, midline and non-tremulous.  Teeth: no disrepair or obvious caries  B/L Upper extremeties: no tremors, no deformities.  Cardio:  no visible edema noted in distal extremities, pedal pulses intact  Chest: moves symmetrically with respiration, no audible wheezes.   CNII-XII are grossly intact. And symmetrical  Sensation appears to be intact in distal extremities and Gait stable.   Insight and judgement are fair. Denies suicidal or homocidal ideation. Cognition intact. Denies audio, visual or tactile hallucinations. Not responding to internal stimuli, good eye contact.      Recent Results (from the past 24 hour(s))   Phenobarbital Level    Collection Time: 07/11/18  5:34 AM   Result Value Ref Range    Phenobarbital 33.9 15.0 - 40.0 ug/mL       Impression:  ETOH disorder??currently withdrawing from ETOH - anticipate 1-2 more days of detox  Cocaine abuse  Extensive hx of other substance abuse including opiates, benzo's, meth but denies recent  use  HTN  Tobacco Use disorder  ??  Plan:  Phenobarbital??taper stopped today and changed to PRN per CIWA score above 8 - see orders.  Phenobarbital level is therapeutic as of 07/11/18.  Thiamine/folic acid.  PRN medications for withdrawal symptoms continue.  Monitoring withdrawal symptoms with CIWA scores per unit protocol.  Nicotine patch 21 mg.  Encouraged to participate in all unit activities.  The patient will be discharged when stable with an active plan of recovery - [facility].  ??  In evaluating this patient, he clearly meets at least ASAM level 3.5 care requiring inpatient residential treatment.        Adonis BrookAlex P Sruti Ayllon, DO  Addiction Medicine  07/11/2018 at 2:14 PM

## 2018-07-11 NOTE — Progress Notes (Signed)
-  eating meals,takin gin fluids  -attending meetings  -social with peers  -given vistaril for anxiety; effective  -multiple pink burns on fingers from using "crack pipe";no signs of infection  -encouraged to make needs known and report changes in condition

## 2018-07-12 MED ORDER — GABAPENTIN 100 MG PO CAPS
100 MG | Freq: Three times a day (TID) | ORAL | Status: DC
Start: 2018-07-12 — End: 2018-07-13
  Administered 2018-07-12 – 2018-07-13 (×4): 100 mg via ORAL

## 2018-07-12 MED FILL — FOLIC ACID 1 MG PO TABS: 1 mg | ORAL | Qty: 1

## 2018-07-12 MED FILL — HYDROXYZINE PAMOATE 50 MG PO CAPS: 50 mg | ORAL | Qty: 1

## 2018-07-12 MED FILL — GABAPENTIN 100 MG PO CAPS: 100 mg | ORAL | Qty: 1

## 2018-07-12 MED FILL — VITAMIN B-1 100 MG PO TABS: 100 mg | ORAL | Qty: 1

## 2018-07-12 MED FILL — PHENOBARBITAL 32.4 MG PO TABS: 32.4 mg | ORAL | Qty: 2

## 2018-07-12 MED FILL — IBUPROFEN 400 MG PO TABS: 400 mg | ORAL | Qty: 1

## 2018-07-12 MED FILL — LISINOPRIL 10 MG PO TABS: 10 mg | ORAL | Qty: 1

## 2018-07-12 MED FILL — NICOTINE 21 MG/24HR TD PT24: 21 mg/(24.h) | TRANSDERMAL | Qty: 1

## 2018-07-12 MED FILL — THERA-M PO TABS: ORAL | Qty: 1

## 2018-07-12 MED FILL — TRAZODONE HCL 100 MG PO TABS: 100 mg | ORAL | Qty: 1

## 2018-07-12 NOTE — Progress Notes (Signed)
Mr. Bradley Calhoun has been observed in the day area most of this shift.  He has been eating well and drinking plenty of fluids.  He has attended groups offered this shift on the unit.  He reported anxiety that was reported to the doctor with changes in medications placed.  He has been able to make needs known to staff.  Will continue to monitor and support.

## 2018-07-12 NOTE — Progress Notes (Signed)
Activity Therapy Group    Duration: 25 minutes    Number of Participants: 6    Group Type: Recreation Therapy    Group Title: Recreation Therapy    Group Goal: Support Completion of Medical Detox    Group Description: Gentle Stretch - To allow Patients the opportunity to engage in gentle stretching from a chair. During the intervention Patients are encouraged to be present in the movements as a means to connect mind and body. Patients are expected to practice self care by not pushing there limits and instead working within them. Support is offered as needed to meet this expectation.    Patient Response: Patient had good eye contact and was able to meet expectations for group without support required. Patient denied any discomfort following the intervention and reported ability to respect their physical limits. Patient did report a lack of regular movement or stretching which has left his body feeling tight. Patient reported some improvement, however, compared to the start of group. Continue to encourage group participation as appropriate.      Oran ReinKelli Leor Whyte, CTRS

## 2018-07-12 NOTE — Progress Notes (Signed)
Chief complaint  Chief Complaint   Patient presents with   ??? Alcohol Problem     Pt A&Ox3. Pt requesting detox from alcohol. Pt states he normally drinks 2 fifths daily and his last drink was 36 hours ago. Pt denies hx of seizures with detox. Pt c/o shakes and paranoia at this time.       Seen for follow up of substance dependence and use and to monitor withdrawals    S:   Patient seen and examined. Overall symptoms improving. Complains of anxiety.    REVIEW OF SYMPTOMS: Patient denies CP or SOB. Denies audio-, visual or tactile hallucinations. Denies dizziness or visual changes. Denies acute pain. Denies rhinorrhea, lacrimation, cough or sore throat. No difficulty ambulating, urinating, swallowing. Denies rash.    O:  Vitals:    07/12/18 1121   BP: 131/85   Pulse: 94   Resp: 18   Temp: 99 ??F (37.2 ??C)   SpO2: 97%     Patient seen and examined. Alert and oriented x 3. NAD.  Head: NCAT.   Skin: warm, dry  Eyes: PERRL. Sclera clear. Conjunctiva clear. No nystagmus.   Tongue: is pink, moist, midline and non-tremulous.  Teeth: no disrepair or obvious caries  B/L Upper extremeties: no tremors, no deformities.  Cardio:  no visible edema noted in distal extremities, pedal pulses intact  Chest: moves symmetrically with respiration, no audible wheezes.   CNII-XII are grossly intact. And symmetrical  Sensation appears to be intact in distal extremities and Gait stable.   Insight and judgement are fair. Denies suicidal or homocidal ideation. Cognition intact. Denies audio, visual or tactile hallucinations. Not responding to internal stimuli, good eye contact.      No results found for this or any previous visit (from the past 24 hour(s)).    Impression:  ETOH disorder??currently withdrawing from ETOH - anticipate discharge tomorrow (07/13/18)  Cocaine abuse  Extensive hx of other substance abuse including opiates, benzo's, meth but denies recent use  HTN  Tobacco Use disorder  Anxiety  ??  Plan:  Phenobarbital??continues as PRN  per CIWA score above 8 - see orders.  Phenobarbital level is therapeutic as of 07/11/18.  Patient complaining of severe anxiety - will add gabapentin 100 mg TID.  Thiamine/folic acid.  PRN medications for withdrawal symptoms continue.  Monitoring withdrawal symptoms with CIWA scores per unit protocol.  Nicotine patch 21 mg.  Encouraged to participate in all unit activities.  The patient will be discharged when stable with an active plan of recovery - [facility].  ??  In evaluating this patient, he clearly meets at least ASAM level 3.5 care requiring inpatient residential treatment.        Adonis BrookAlex P Zarek Relph, DO  Addiction Medicine  07/12/2018 at 12:36 PM

## 2018-07-12 NOTE — Progress Notes (Signed)
Nutrition Assessment    Type and Reason for Visit: Reassess    Nutrition Diagnosis:   ?? Problem: Increased nutrient needs  ?? Etiology: related to Psychological cause/life stress, Partial or complete edentulism    ??? Signs and symptoms:  as evidenced by Presence of wounds, Patient report of    Nutrition Assessment:  ?? Subjective Assessment: consuming meals and supplements, dislikes chocolate Ensure wants vanilla, good appetite continues  ?? Nutrition-Focused Physical Findings: consuming meals and supplements, dislikes chocolate Ensure wants vanilla, good appetite continues  ?? Wound Type: (healing bite mark left index finger (visualized))  ?? Current Nutrition Therapies:  ?? Oral Diet Orders: General   ?? Oral Diet intake: 76-100%  ?? Oral Nutrition Supplement (ONS) Orders: Low Volume Supplement  ?? ONS intake: 76-100%  ?? Anthropometric Measures:  ?? Ht: 5\' 8"  (172.7 cm)   ?? Admission Body Wt: 120 lb (54.4 kg)  ?? Usual Body Wt: 135 lb (61.2 kg)(est. by pt.)  ?? % Weight Change:  ,  estimated 11% loss over 3 months  ?? Ideal Body Wt: 154 lb (69.9 kg), % Ideal Body 78%  ?? BMI Classification: BMI <18.5 Underweight  ?? Comparative Standards (Estimated Nutrition Needs):  ?? Estimated Daily Total Kcal: x 25 - 30 kcals = 1620 - 1890 kcals  ?? Estimated Daily Protein (g): x 1.2 - 1.3 gms protein = 65 - 70 gms protein      Estimated Intake vs Estimated Needs: Estimated intake meeting estimated needs     Nutrition Risk Level: Moderate    Nutrition Interventions:   Continue current diet, Modify current ONS(will change to Ensure HP bid vanilla)  Continued Inpatient Monitoring, Education not appropriate at this time, Coordination of Care(will assign level 1 referring to diet tech to monitor)    Nutrition Evaluation:   ?? Evaluation: Goal achieved   ?? Goals: consuming 100% of supplement    ?? Monitoring: Meal Intake, Supplement Intake, Skin Integrity, Wound Healing, I&O, Mental Status/Confusion, Weight, Monitor Hemodynamic Status    See Adult  Nutrition Doc Flowsheet for more detail.     Electronically signed by Magda PaganiniMaureen Shakari Qazi, RD, LD on 07/12/18 at 12:00 PM    Contact Number: Pager # [phone]

## 2018-07-12 NOTE — Progress Notes (Addendum)
Pt was up ad lib and social on unit at beginning of shift. Pt has tolerated PO intake and treatment well. Pt slept well throughout the night, easily aroused for med administration. Pt is cooperative and pleasant, will continue to monitor.   PRN vistaril and trazodone were effective, vistaril wore off quickly per patient. Pt stated 3rd morning that a headache came on, prn motrin given for HA rated 5 out of 10 at [phone]

## 2018-07-13 MED FILL — THERA-M PO TABS: ORAL | Qty: 1

## 2018-07-13 MED FILL — NICOTINE 21 MG/24HR TD PT24: 21 mg/(24.h) | TRANSDERMAL | Qty: 1

## 2018-07-13 MED FILL — TRAZODONE HCL 100 MG PO TABS: 100 mg | ORAL | Qty: 1

## 2018-07-13 MED FILL — GABAPENTIN 100 MG PO CAPS: 100 mg | ORAL | Qty: 1

## 2018-07-13 MED FILL — FOLIC ACID 1 MG PO TABS: 1 mg | ORAL | Qty: 1

## 2018-07-13 MED FILL — HYDROXYZINE PAMOATE 50 MG PO CAPS: 50 mg | ORAL | Qty: 1

## 2018-07-13 MED FILL — IBUPROFEN 400 MG PO TABS: 400 mg | ORAL | Qty: 1

## 2018-07-13 MED FILL — LISINOPRIL 10 MG PO TABS: 10 mg | ORAL | Qty: 1

## 2018-07-13 MED FILL — VITAMIN B-1 100 MG PO TABS: 100 mg | ORAL | Qty: 1

## 2018-07-13 MED FILL — PHENOBARBITAL 32.4 MG PO TABS: 32.4 mg | ORAL | Qty: 2

## 2018-07-13 NOTE — Discharge Summary (Signed)
Physician Discharge Summary     Patient ID:  Bradley Calhoun  [id number]  27 y.o.  1991/04/23    Admit date: 07/07/2018    Discharge date and time:  07/13/18    Admitting Physician: Lindwood Qua, MD     Discharge Physician: Adonis Brook, DO    Admission Diagnoses: Alcohol abuse [F10.10]  Alcohol withdrawal syndrome without complication (HCC) [F10.230]  Substance dependence disorder     Discharge Diagnoses: Alcohol abuse [F10.10]  Alcohol withdrawal syndrome without complication (HCC) [F10.230]    Admission Condition: UNSTABLE, IN ACTIVE WITHDRAWAL    Discharged Condition: STABLE, GOOD    Hospital Course: Patient was detoxified from alcohol with a phenobarbital taper. He completed tx successfully without any complications. He was engaged in unit activities. On day of discharge, the patient was without any s/s of withdrawal and was stable. He will [facility] for continued recovery tx.       ROS entirely negative.  Patient denies nausea, vomitting, diarrhea. Denies CP or SOB.Denies audio-, visual or tactile hallucinations. Denies dizziness or visual changes. Denies acute pain. Denies rhinorrhea, lacrimation, cough or sore throat. No difficulty ambulating, urinating, swallowing. Denies suicidal or homicidal ideation.    All of systems  ROS was completed and was negative unless stated above      Consults:  None    Significant Diagnostic Studies: No results found for this or any previous visit (from the past 48 hour(s)).    Discharge Exam:  BP 110/67    Pulse 98    Temp 97.5 ??F (36.4 ??C) (Temporal)    Resp 20    Ht 5\' 8"  (1.727 m)    Wt 120 lb (54.4 kg)    SpO2 98%    BMI 18.25 kg/m??   Patient seen and examined. Alert and oriented x 3. NAD. Head NCAT. Skin: warm. PERRL. Sclera clear. Conjunctiva clear. No nystagmus. Tongue is pink, moist, midline and non-tremulous. Upper extremeties demonstrate no gross visible or palpable tremor. Chest: moves symmetrically with respiration, no audible wheezes. CNII-XII  are grossly intact. Sensation appears to be intact in distal extremities and no visible edema noted in distal extremities. Gait stable. Insight and judgement are fair. Denies suicidal or homocidal ideation. Cognition intact. Denies audio, visual or tactile hallucinations. Not responding to internal stimuli      Disposition: home with plans to follow-up [facility] for ongoing recovery tx    Patient Instructions:   Current Discharge Medication List      CONTINUE these medications which have NOT CHANGED    Details   lisinopril (PRINIVIL;ZESTRIL) 10 MG tablet Take 10 mg by mouth daily           Activity: RESUME REGULAR ACTIVITY  Diet: RESUME REGULAR HOME DIET      Follow-up with PCP in 1-2 WEEKS   Instructions     ? After detox you should abstain from any use of any mood altering chemical  ? Appointment with your primary care physician should be scheduled  ? It is highly recommended that you attend post hospital treatment  ? Please read the information give to you - Intro to 12 step programs  ? Call the National Suicide Prevention Hotline if needed at: 1-800-273-TALK 857 483 2363)     ?? Please call the following number should you have questions regarding your discharge or aftercare appointments:  [address] [place]                                      [phone]          Signed:  Adonis BrookAlex P Jencarlo Bonadonna, DO  07/13/2018  12:14 PM

## 2018-07-13 NOTE — Progress Notes (Signed)
At 1450   Discharge instructions given to patient.  Pt voiced understanding of d/c instructions. Patient  denied suicidal or homicidal ideation. Patient was cautioned that if he were to drink again, the amount he was drinking could cause overdose and/or death.Pt. Voiced understanding of caution. Pt. Was encouraged to do the follow up as instructed, attend meetings and get a sponsor. Resource information was given and explained.Belongings returned locked on the unit.Nicotine patch removed. Patient was be escorted to security for the remainder of his belongings.   ??

## 2018-07-13 NOTE — Care Coordination-Inpatient (Signed)
Pt is being discharged home today. [facility] and reports intent to follow up with them independently. Pt understands if he stays another day we may be able to facilitate a direct admission, pt is unwilling to do this. [facility] contact number added to pt's follow up. Pt will be taking the bus home. He has a [place] pass but needs to get the transit station in [place] first. Pt will be provided with one [employer] pass. Pt denies SI/HI/SIB. Pt has no additional SW needs. Pt provided with unit contact information for further consultation after discharge. Electronically signed by Elveria RisingKelly Lizbett Garciagarcia, LISW on 07/13/2018 at 12:19 PM

## 2018-07-13 NOTE — Care Coordination-Inpatient (Signed)
[facility] Only 1 page of fax was received. Re-faxed referral packet. [facility] will review the information with her director. There may be beds available for direct admission should pt be approved.  Electronically signed by Elveria RisingKelly Aragon Scarantino, LISW on 07/13/2018 at 9:29 AM

## 2018-07-13 NOTE — Progress Notes (Signed)
Pt was up ad lib and social on unit at beginning of shift. Pt has tolerated PO intake and treatment well. Reports gabapentin doing much more for his anxiety than the vistaril ever did. Pt slept well throughout the night, ( after 0030) .Marland Kitchen. Pt is cooperative and pleasant, will continue to monitor. PT was asking a prn phen at 2330/ midnight. Went back to revaluate and patient was already sleeping.

## 2018-07-13 NOTE — Other (Unsigned)
Patient Acct Nbr: [id number]   Primary AUTH/CERT: [id number] [facility]  Primary [facility] Company Name: [facility]  Primary Insurance Plan name: [facility]  Primary Insurance Group Number: [id number]  Primary Insurance Plan Type: Health  Primary Insurance Policy Number: [id number]

## 2018-11-07 ENCOUNTER — Inpatient Hospital Stay
Admission: EM | Admit: 2018-11-07 | Discharge: 2018-11-13 | Disposition: A | Discharge status: Home / Self Care | Payer: MEDICAID | Admitting: Addiction Medicine

## 2018-11-07 DIAGNOSIS — F1010 Alcohol abuse, uncomplicated: Principal | ICD-10-CM

## 2018-11-07 LAB — COMPREHENSIVE METABOLIC PANEL
ALT: 33 U/L (ref 0–49)
AST: 56 U/L — ABNORMAL HIGH (ref 15–46)
Albumin,Serum: 4.4 g/dL (ref 3.5–5.0)
Alkaline Phosphatase: 63 U/L (ref 38–126)
Anion Gap: 8 NA
BUN: 8 mg/dL (ref 7–20)
CO2: 33 mmol/L — ABNORMAL HIGH (ref 22–30)
Calcium: 9.7 mg/dL (ref 8.4–10.4)
Chloride: 95 mmol/L — ABNORMAL LOW (ref 98–107)
Creatinine: 0.84 mg/dL (ref 0.52–1.25)
Glucose: 91 mg/dL (ref 70–100)
Potassium: 4.4 mmol/L (ref 3.5–5.1)
Sodium: 136 mmol/L (ref 135–145)
Total Bilirubin: 0.9 mg/dL (ref 0.2–1.3)
Total Protein: 7.3 g/dL (ref 6.3–8.2)
eGFR AA: >60 mL/min (ref >60)
eGFR NON-AA: >60 mL/min (ref >60)

## 2018-11-07 LAB — URINALYSIS
Bacteria, UA: NEGATIVE /HPF
Bilirubin Urine: NEGATIVE mg/dL
Glucose, Ur: NORMAL mg/dL (ref <70)
Hyaline Casts, UA: NEGATIVE /LPF
Ketones, Urine: 10 mg/dL
LEUKOCYTES, UA: NEGATIVE Leu/uL
Nitrite, Urine: NEGATIVE NA
Occult Blood,Urine: NEGATIVE mg/dL
Specific Gravity, Urine: 1.024 NA (ref 1.005–1.030)
Total Protein, Urine: 20 mg/dL
Urobilinogen, Urine: 2 mg/dL (ref 0–1)
pH, Urine: 6 NA (ref 5.0–8.0)

## 2018-11-07 LAB — CBC WITH AUTO DIFFERENTIAL
Basophils %: 1 % (ref 0.0–2.0)
Basophils Absolute: 0 10*3/uL (ref 0.0–0.2)
Eosinophils Absolute: 0.1 10*3/uL (ref 0.0–0.5)
Eosinophils: 2.6 % (ref 1.0–6.0)
Granulocytes %: 56.2 % (ref 40.0–80.0)
Hematocrit: 48.8 % (ref 40.0–52.0)
Hemoglobin: 16.7 g/dL (ref 13.0–18.0)
Lymphocyte %: 31.2 % (ref 20.0–40.0)
Lymphocytes Absolute: 1.5 10*3/uL (ref 1.0–4.3)
MCH: 33.5 pg (ref 26.0–34.0)
MCHC: 34.1 % (ref 32.0–36.0)
MCV: 98 fL (ref 80.0–98.0)
MPV: 8.4 fL (ref 7.4–10.4)
Monocytes %: 9 % (ref 2.0–10.0)
Monocytes Absolute: 0.4 10*3/uL (ref 0.0–0.8)
Neutrophils Absolute: 2.8 10*3/uL (ref 1.8–7.0)
Platelets: 260 10*3/uL (ref 140–440)
RBC: 4.98 10*6/uL (ref 4.40–5.90)
RDW: 13.5 % (ref 11.5–14.5)
WBC: 4.9 10*3/uL (ref 3.6–10.7)

## 2018-11-07 LAB — URINE DRUG SCREEN
Amphetamine, Urine: NEGATIVE NA
Barbiturates, Urine: NEGATIVE NA
Benzodiazepine Screen, Urine: NEGATIVE NA
Cocaine Metabolite, Urine: POSITIVE NA
Methadone, Urine: NEGATIVE NA
Opiates, Urine: NEGATIVE NA
Oxycodone Screen, Ur: NEGATIVE NA
Phencyclidine, Urine: NEGATIVE NA

## 2018-11-07 LAB — ETHANOL: Ethanol Lvl: <0.01 g/dL (ref 0.000–0.010)

## 2018-11-07 MED ORDER — PHENOBARBITAL 32.4 MG PO TABS
32.4 MG | Freq: Once | ORAL | Status: AC
Start: 2018-11-07 — End: 2018-11-07
  Administered 2018-11-07: 23:00:00 97.2 mg via ORAL

## 2018-11-07 MED FILL — PHENOBARBITAL 32.4 MG PO TABS: 32.4 mg | ORAL | Qty: 3

## 2018-11-07 NOTE — ED Provider Notes (Signed)
Emergency DepartmentEncounter  [facility] EMERGENCY DEPT      Patient: Bradley Calhoun  TIR:[id number]  DOB: July 31, 1991  Date of Evaluation: 11/07/2018  ED APP Provider: Gordy Clement, APRN - CNP      EDcare was supervised by Dr. [place] who independently examined and evaluated the patient. Please see their attestation note for further detail.    Chief Complaint       Chief Complaint   Patient presents with   ??? Addiction Problem     pt wishes to detox from ETOH and subutex (not prescribed to pt). last use ETOH 0900, subutex 1.5 days ago. pt calm, cooperative, no acute distress. ambulatory without difficulty with steady gait.     HOPI     Bradley Calhoun is a 28 y.o. male whopresents to the emergency department evaluation of alcohol detox and Suboxone detox.  Patient states her last intake of alcohol was 9 a.m. and he had half a can of beer.  States intake of Suboxone was 2 days ago.  States typically he takes 8-12 mg of Suboxone a day.  2 days ago, he to 4 mg of Suboxone.  Patient does not have any obvious tremors, is not tachycardic, denies any headaches, chest pain.  States typically, he drinks about one case of beer a day.  I did be using Suboxone that he gets from [employer] approximate 2 months ago.  Prior to that, in November, states that he was admitted in [place] for alcohol detox.  Ill psych he has been relapsing due to negative influence and hanging out with "wrong crowd" he denies any suicidal or homicidal ideation.  Denies any depression.  Patient is alert and oriented ??3, calm and cooperative.  States this time, he is really motivated to detox because he really wants to have a better relationship with his family members.  She denies no other concerning symptoms.      Nursing Notes were reviewed.    REVIEW OFSYSTEMS    (2+ for level 4; 10+ for level 5)     Review of Systems   Constitutional: Negative.  Negative for activity change, appetite change, chills, diaphoresis, fatigue and fever.   HENT: Negative.   Negative for postnasal drip and rhinorrhea.    Respiratory: Negative.  Negative for cough, chest tightness, shortness of breath and wheezing.    Cardiovascular: Negative.  Negative for chest pain.   Gastrointestinal: Negative.  Negative for abdominal pain, nausea and vomiting.   Genitourinary: Negative.  Negative for difficulty urinating.   Musculoskeletal: Negative for gait problem and joint swelling.   Skin: Negative.  Negative for wound.   Neurological: Negative.  Negative for dizziness and headaches.   Psychiatric/Behavioral: Positive for sleep disturbance (Unable to sleep that time). Negative for agitation, behavioral problems, confusion, self-injury and suicidal ideas. The patient is not nervous/anxious and is not hyperactive.        Review of systems reviewed and otherwise acutely negative except as in the [place].      Past History     Past Medical History:   Diagnosis Date   ??? Hypertension      No past surgical history on file.  Social History     Socioeconomic History   ??? Marital status: Single     Spouse name: Not on file   ??? Number of children: Not on file   ??? Years of education: Not on file   ??? Highest education level: Not on file   Occupational History   ???  Not on file   Social Needs   ??? Financial resource strain: Not on file   ??? Food insecurity     Worry: Not on file     Inability: Not on file   ??? Transportation needs     Medical: Not on file     Non-medical: Not on file   Tobacco Use   ??? Smoking status: Current Every Day Smoker     Packs/day: 1.00     Types: Cigarettes   ??? Smokeless tobacco: Never Used   Substance and Sexual Activity   ??? Alcohol use: Yes     Comment: 10-15  24oz beers per day   ??? Drug use: Yes     Types: Marijuana     Comment: subutex   ??? Sexual activity: Not on file   Lifestyle   ??? Physical activity     Days per week: Not on file     Minutes per session: Not on file   ??? Stress: Not on file   Relationships   ??? Social Product manager on phone: Not on file     Gets together: Not on  file     Attends religious service: Not on file     Active member of club or organization: Not on file     Attends meetings of clubs or organizations: Not on file     Relationship status: Not on file   ??? Intimate partner violence     Fear of current or ex partner: Not on file     Emotionally abused: Not on file     Physically abused: Not on file     Forced sexual activity: Not on file   Other Topics Concern   ??? Not on file   Social History Narrative   ??? Not on file       Medications/Allergies     Previous Medications    LISINOPRIL (PRINIVIL;ZESTRIL) 10 MG TABLET    Take 10 mg by mouth daily     No Known Allergies     Physical Exam       ED Triage Vitals   BP Temp Temp Source Pulse Resp SpO2 Height Weight   11/07/18 1437 11/07/18 1437 11/07/18 1437 11/07/18 1437 11/07/18 1437 11/07/18 1437 11/07/18 1439 11/07/18 1439   (!) 133/99 97 ??F (36.1 ??C) Temporal 80 16 96 % '5\' 9"'  (1.753 m) 130 lb (59 kg)     Physical Exam    Physical Exam  Vital Signs: Reviewed  Constitutional: Healthy appearing, cooperative, Caucasian male, patient is oriented to person, place, and time. appears well-developed and well-nourished. No distress.   Head: normocephalic, atraumatic  Eyes: no scleral injection, no scleral icterus, no conjunctival erythema, Pupils are equal, round, and reactive to light.   ENT: oropharynx clear, bilateral tympanic membrane positive light reflex without erythematous.  Bilateral nares without redness, swelling, no postnasal drip noted. Mucous membranes moist.  Neck: supple, trachea midline.    Cardiovascular: RRR, Normal rate, regular rhythm, normal heart sounds and intact distal pulses. No murmur heard.  Pulmonary: no evidence of labored breathing, clear to auscultation bilaterally  Abdominal: Soft. no distension. There is no tenderness. There is no rebound.  BM sounds intact x 4 quadrants.   Extremities: warm, well perfused, no edema  Skin: Skin is warm and dry. No rash noted. No erythema.  Psychiatric: a normal  mood and affect. Judgment and thought content normal.   Neurological: AAO x 3, sensation intact,  No obvious neurological deficits, no gross facial drooping.  Move all 4 extremities spontaneously.    Nursing note and vitals reviewed    Diagnostics   Labs:  Results for orders placed or performed during the hospital encounter of 11/07/18   Ethanol   Result Value Ref Range    Ethanol Lvl <0.010 0.000 - 0.010 g/dL   Urine Drug Screen   Result Value Ref Range    Amphetamines, urine Negative NA    Barbiturates, Ur Negative NA    Benzodiazepine Ur Qual Negative NA    Cocaine Metabolites, Ur Positive NA    Methadone, Urine Negative NA    Opiates, Urine Negative NA    Oxycodone Screen, Ur Negative NA    PCP, Urine Negative NA   Comprehensive Metabolic Panel   Result Value Ref Range    Sodium 136 135 - 145 mmol/L    Potassium 4.4 3.5 - 5.1 mmol/L    Chloride 95 (L) 98 - 107 mmol/L    CO2 33 (H) 22 - 30 mmol/L    Anion Gap 8 NA    Glucose 91 70 - 100 mg/dL    BUN 8 7 - 20 mg/dL    CREATININE 0.84 0.52 - 1.25 mg/dL    eGFR African American >60.0 >60 mL/min    EGFR IF NonAfrican American >60.0 >60 mL/min    Calcium 9.7 8.4 - 10.4 mg/dL    Albumin,Serum 4.4 3.5 - 5.0 g/dL    Total Protein 7.3 6.3 - 8.2 g/dL    Total Bilirubin 0.9 0.2 - 1.3 mg/dL    Alkaline Phosphatase 63 38 - 126 U/L    ALT 33 0 - 49 U/L    AST 56 (H) 15 - 46 U/L   Hemogram  (CBC) w/Auto Diff   Result Value Ref Range    WBC 4.9 3.6 - 10.7 10*3/uL    RBC 4.98 4.40 - 5.90 10*6/uL    Hemoglobin 16.7 13.0 - 18.0 g/dL    Hematocrit 48.8 40.0 - 52.0 %    MCV 98.0 80.0 - 98.0 fL    MCH 33.5 26.0 - 34.0 pg    MCHC 34.1 32.0 - 36.0 %    RDW 13.5 11.5 - 14.5 %    Platelets 260 140 - 440 10*3/uL    MPV 8.4 7.4 - 10.4 fL    Granulocytes % 56.2 40.0 - 80.0 %    Lymphocyte % 31.2 20.0 - 40.0 %    Monocytes 9.0 2.0 - 10.0 %    Eosinophils 2.6 1.0 - 6.0 %    Basophils 1.0 0.0 - 2.0 %    Absolute Neut # 2.8 1.8 - 7.0 10*3/uL    Absolute Lymph # 1.5 1.0 - 4.3 10*3/uL    Absolute  Mono # 0.4 0.0 - 0.8 10*3/uL    Absolute Eos # 0.1 0.0 - 0.5 10*3/uL    Absolute Baso # 0.0 0.0 - 0.2 10*3/uL   Urinalysis   Result Value Ref Range    Glucose, Ur Normal Normal (<70) mg/dL    Total Protein, Urine 20 Negative mg/dL    Bilirubin Urine Negative Negative mg/dL    Urobilinogen, Urine 2 Normal (0-1) mg/dL    pH, Urine 6.0 5.0 - 8.0 NA    Specific Gravity, Urine 1.024 1.005 - 1.030 NA    Occult Blood,Urine Negative Negative mg/dL    Ketones, Urine 10 Negative mg/dL    Nitrite, Urine Negative Negative NA    LEUKOCYTES, UA  Negative Negative Leu/uL    Appearance Clear Clear NA    Color, Urine Yellow Lt. Yellow NA    RBC, UA 0-2 0 - 2 /[HPF]    WBC, UA 0-2 0 - 5 /[HPF]    Squam Epithel, UA 0-2 3 - 5 /[HPF]    Bacteria, UA Negative Negative /[HPF]    Mucous Threads Many Negative /[LPF]    Hyaline Casts, UA Negative Negative /[LPF]       ED Course and MDM           Procedures    In brief, Bradley Calhoun isa 28 y.o. male who presented to the emergency department emergency room for detox alcohol and Suboxone and inpatient admission.  Patient's all labs reviewed.  His alcohol level is normal.  Likely because he is last intake of beer was this morning and had only half a can.  Last time he used Suboxone was 2 days ago and was 4 mg.  States he typically takes 8-12 mg per day.  Patient does not have any withdrawals symptoms at this time which includes tachycardia, tremors, chest pain.  I spoke to Dr.Vellanki, psychiatrist on call at [facility] and kindly accepted admission admission.  Just after speaking to Dr. [place] Carbo, my attending, Dr. Myra Gianotti also saw this patient.  Ordered phenobarbital ??1 prophylactically prevent from withdrawal.  Patient seems quite motivated.  Appears he has had rehabilitation admissions in the past.  States this time he has more motivation because he really wants to have a better relationship with his family members.  Patient's signs during an prior to transfer is stable.  He denies no other  concerns.      Counseling:  The emergency provider has spoken with the patient and discussed today???s results, in addition to providing specific details for the plan of care and counseling regarding the diagnosis and prognosis.  Questions are answered at this time and they are agreeable with the plan. Patient understands if symptoms are worsening despite taking medications, fever or any other concerning symptoms to come back to emergency room or seek medical attention.  Patient verbalized understanding and agreeable with plan of transfer to [place]: Please note this report has been produced using speech recognition software and may contain errors related to that system including errors in grammar, punctuation, and spelling as well as words and phrases that may be inappropriate.  If there are any questions or concerns please feel free to contact the dictating provider for clarification.      ED Medication Orders (From admission, onward)    Start Ordered     Status Ordering Provider    11/07/18 1815 11/07/18 1812  PHENobarbital (LUMINAL) tablet 97.2 mg  ONCE      Acknowledged [place], SCOTT A            Final Impression      1. Alcohol abuse    2. Substance abuse ([place])        DISPOSITION Admitted 11/07/2018 06:27:12 PM     (Please note that portions of this note may have been completed with a voice recognition program. Efforts were made to edit the dictations but occasionally words aremis-transcribed.)    Gordy Clement, APRN - CNP  [facility]               Gordy Clement, APRN - CNP  11/07/18 1838

## 2018-11-07 NOTE — Progress Notes (Signed)
Patient arrived to theunit at 2048 via stretcher to room 732A for admission. Pt is here to detox from ETOH and suboxone ( not prescribed). Pt states his last drink was 11/07/18 0900 usually drinks 30 pk beer daily, has relapsed about 2 months ago since leaving [facility] ( which he stayed 2 months at). Pt also relapsed at the same time with fentanyl for 1 weeks then switched to taking suboxone. Has been taking between 8-12 mg of subs, last use about 2 days ago. Pt denies seizures, denies falls, denies hallucinations, denies SI and HI. Pt is A&Ox4, steady and on his feet. Call light within reach. Has eaten a meal and encouraged fluids. Pt tramadol and phenobarb taper were modified to start at 2230 since patient was given a 97mg  phenobarb in ED at 1830. Will continue to monitor.

## 2018-11-07 NOTE — ED Notes (Signed)
[facility] on site already, ETA 2015.  Pt provided sack lunch and snacks.  Patient is alert and oriented, calm and cooperative.  NAD.  Respirations are even and unlabored.  Bed in lowest position, call light within reach.     Allie Bossier, RN  11/07/18 2004

## 2018-11-07 NOTE — ED Notes (Signed)
[id number] pt is A/O x3,skin W/D. Pt talking about how he had been in Rehab for 2 months and the day he got out he used fentanyl and cocaine. Pt states he wants to go to "a 6 month or longer Rehab." pt states if he can get clean and stay clean he can go down to where his family lives down [place]. Pt states he had 4mg  of suboxone 2 days ago and had "half a beer this morning around 0900". labs drawn from [facility] via 23g butterfly on first attempt and taken to CCL. Pt tolerated well. Pt ambulated to and from [facility] and urine spec obtained and taken to CCL. Has call light     Estell Harpin. Aundria Rud, RN  11/07/18 1701

## 2018-11-07 NOTE — ED Provider Notes (Signed)
Emergency Department Encounter  [facility] EMERGENCY DEPT    Patient: Bradley Calhoun  MRN: [id number]  DOB: 1991-07-11  Date of Evaluation: 11/07/2018  ED Supervising Physician: Navy Belay A [place], MD    I independently examined and evaluated Bradley Calhoun.    In brief, Bradley Calhoun is a 28 y.o. male that presents to the emergency department and I want admitted for detox.  Patient reports drinking beer daily.  States his last use was 09 100 this morning.  Also reports using Subutex illicitly.  Reports remote history of illicit intervenous drug use.    Focused exam:    28 year old well-appearing adult male in no acute distress.  Normal respiratory effort.  No tongue fasciculations.  No tremor.  Reports feeling mildly anxious    Brief ED course/MDM: Medically evaluated and cleared for admission for detoxification.  Treated prophylactically with phenobarital and admitted to [facility]. [facility].    All diagnostic, treatment, and disposition decisions were made by myself in conjunction with the APP. For all further details of the patient's emergency department visit, please see their documentation.    (Please note that portions of this note may have been completed with a voice recognition program. Efforts were made to edit the dictations but occasionally words are mis-transcribed.)    Delora Gravatt A [place], MD  [facility]        Marcelles Clinard A [place], MD  11/09/18 0021

## 2018-11-07 NOTE — ED Notes (Signed)
Lequita Halt RN received report on M7E.     Allie Bossier, RN  11/07/18 1955

## 2018-11-07 NOTE — ED Notes (Signed)
Pt changed into two hospital gowns and hospital socks.  Pt skin checked by this RN and wanded by security.  Pt belongings collected and documented.  Pt has two belongings bags. Bags labeled with patient labels for identification.      Allie Bossier, RN  11/07/18 1950

## 2018-11-07 NOTE — ED Notes (Signed)
Pt has been medicated po per order-NKDA to this. Pt was given [place] rules which he did sign. Pt was informed of protocol/policy for transferring him to [place] and pt is agreeable.     Estell Harpin. Aundria Rud, RN  11/07/18 [phone]

## 2018-11-08 LAB — BUPRENORPHINE: Buprenorphine: POSITIVE NA

## 2018-11-08 MED ORDER — PROMETHAZINE HCL 25 MG PO TABS
25 MG | Freq: Four times a day (QID) | ORAL | Status: DC | PRN
Start: 2018-11-08 — End: 2018-11-13

## 2018-11-08 MED ORDER — TRAMADOL HCL 50 MG PO TABS
50 MG | Freq: Three times a day (TID) | ORAL | Status: AC
Start: 2018-11-08 — End: 2018-11-10
  Administered 2018-11-10 (×3): 100 mg via ORAL

## 2018-11-08 MED ORDER — TRAMADOL HCL 50 MG PO TABS
50 MG | Freq: Four times a day (QID) | ORAL | Status: DC
Start: 2018-11-08 — End: 2018-11-07

## 2018-11-08 MED ORDER — HYDROXYZINE PAMOATE 50 MG PO CAPS
50 MG | Freq: Four times a day (QID) | ORAL | Status: DC | PRN
Start: 2018-11-08 — End: 2018-11-09
  Administered 2018-11-08 (×3): 50 mg via ORAL

## 2018-11-08 MED ORDER — ACETAMINOPHEN 325 MG PO TABS
325 MG | Freq: Four times a day (QID) | ORAL | Status: DC | PRN
Start: 2018-11-08 — End: 2018-11-13
  Administered 2018-11-08 – 2018-11-13 (×12): 650 mg via ORAL

## 2018-11-08 MED ORDER — LOPERAMIDE HCL 2 MG PO CAPS
2 MG | Freq: Four times a day (QID) | ORAL | Status: DC | PRN
Start: 2018-11-08 — End: 2018-11-13

## 2018-11-08 MED ORDER — ALUM & MAG HYDROXIDE-SIMETH 200-200-20 MG/5ML PO SUSP
200-200-20 MG/5ML | Freq: Four times a day (QID) | ORAL | Status: DC | PRN
Start: 2018-11-08 — End: 2018-11-13
  Administered 2018-11-10: 14:00:00 30 mL via ORAL

## 2018-11-08 MED ORDER — THERAPEUTIC MULTIVIT/MINERAL PO TABS
Freq: Every day | ORAL | Status: DC
Start: 2018-11-08 — End: 2018-11-13
  Administered 2018-11-08 – 2018-11-12 (×5): 1 via ORAL

## 2018-11-08 MED ORDER — TRAMADOL HCL 50 MG PO TABS
50 MG | Freq: Three times a day (TID) | ORAL | Status: DC
Start: 2018-11-08 — End: 2018-11-07

## 2018-11-08 MED ORDER — DICYCLOMINE HCL 20 MG PO TABS
20 MG | Freq: Four times a day (QID) | ORAL | Status: DC | PRN
Start: 2018-11-08 — End: 2018-11-13

## 2018-11-08 MED ORDER — FOLIC ACID 1 MG PO TABS
1 MG | Freq: Every day | ORAL | Status: DC
Start: 2018-11-08 — End: 2018-11-13
  Administered 2018-11-08 – 2018-11-12 (×5): 1 mg via ORAL

## 2018-11-08 MED ORDER — TRAMADOL HCL 50 MG PO TABS
50 MG | Freq: Four times a day (QID) | ORAL | Status: AC
Start: 2018-11-08 — End: 2018-11-09
  Administered 2018-11-09 (×4): 100 mg via ORAL

## 2018-11-08 MED ORDER — MAGNESIUM HYDROXIDE 400 MG/5ML PO SUSP
400 MG/5ML | Freq: Every day | ORAL | Status: DC | PRN
Start: 2018-11-08 — End: 2018-11-13

## 2018-11-08 MED ORDER — TRAZODONE HCL 100 MG PO TABS
100 MG | Freq: Every evening | ORAL | Status: DC | PRN
Start: 2018-11-08 — End: 2018-11-13
  Administered 2018-11-08 – 2018-11-13 (×5): 100 mg via ORAL

## 2018-11-08 MED ORDER — PHENOBARBITAL 97.2 MG PO TABS
97.2 MG | ORAL | Status: DC
Start: 2018-11-08 — End: 2018-11-09
  Administered 2018-11-08 – 2018-11-09 (×10): 97.2 mg via ORAL

## 2018-11-08 MED ORDER — NICOTINE 21 MG/24HR TD PT24
21 MG/24HR | Freq: Every day | TRANSDERMAL | Status: DC
Start: 2018-11-08 — End: 2018-11-13
  Administered 2018-11-08 – 2018-11-12 (×5): 1 via TRANSDERMAL

## 2018-11-08 MED ORDER — ONDANSETRON HCL 8 MG PO TABS
8 MG | Freq: Three times a day (TID) | ORAL | Status: DC | PRN
Start: 2018-11-08 — End: 2018-11-08
  Administered 2018-11-08: 13:00:00 4 mg via ORAL

## 2018-11-08 MED ORDER — VITAMIN B-1 100 MG PO TABS
100 MG | Freq: Every day | ORAL | Status: DC
Start: 2018-11-08 — End: 2018-11-13
  Administered 2018-11-08 – 2018-11-12 (×5): 100 mg via ORAL

## 2018-11-08 MED ORDER — TRAMADOL HCL 50 MG PO TABS
50 MG | ORAL | Status: DC
Start: 2018-11-08 — End: 2018-11-07

## 2018-11-08 MED ORDER — TRAMADOL HCL 50 MG PO TABS
50 MG | ORAL | Status: AC
Start: 2018-11-08 — End: 2018-11-08
  Administered 2018-11-08 (×6): 100 mg via ORAL

## 2018-11-08 MED ORDER — PHENOBARBITAL 97.2 MG PO TABS
97.2 MG | ORAL | Status: DC
Start: 2018-11-08 — End: 2018-11-07

## 2018-11-08 MED ORDER — BACLOFEN 10 MG PO TABS
10 MG | Freq: Three times a day (TID) | ORAL | Status: DC
Start: 2018-11-08 — End: 2018-11-11
  Administered 2018-11-08 – 2018-11-11 (×11): 10 mg via ORAL

## 2018-11-08 MED FILL — THERA-M PO TABS: ORAL | Qty: 1

## 2018-11-08 MED FILL — ACETAMINOPHEN 325 MG PO TABS: 325 mg | ORAL | Qty: 2

## 2018-11-08 MED FILL — VITAMIN B-1 100 MG PO TABS: 100 mg | ORAL | Qty: 1

## 2018-11-08 MED FILL — PHENOBARBITAL 97.2 MG PO TABS: 97.2 mg | ORAL | Qty: 1

## 2018-11-08 MED FILL — HYDROXYZINE PAMOATE 50 MG PO CAPS: 50 mg | ORAL | Qty: 1

## 2018-11-08 MED FILL — BACLOFEN 10 MG PO TABS: 10 mg | ORAL | Qty: 1

## 2018-11-08 MED FILL — FOLIC ACID 1 MG PO TABS: 1 mg | ORAL | Qty: 1

## 2018-11-08 MED FILL — TRAZODONE HCL 100 MG PO TABS: 100 mg | ORAL | Qty: 1

## 2018-11-08 MED FILL — TRAMADOL HCL 50 MG PO TABS: 50 mg | ORAL | Qty: 2

## 2018-11-08 MED FILL — NICOTINE 21 MG/24HR TD PT24: 21 mg/(24.h) | TRANSDERMAL | Qty: 1

## 2018-11-08 MED FILL — ONDANSETRON HCL 8 MG PO TABS: 8 mg | ORAL | Qty: 1

## 2018-11-08 NOTE — Progress Notes (Signed)
PT was up to eat then returned to room. Pt has tolerated PO intake and treatment well. Pt slept well throughout the night, easily aroused for med administration. Pt is cooperative and pleasant, will continue to monitor.

## 2018-11-08 NOTE — Other (Signed)
28 yr old SWM admitted for detox from alcohol and opiates; pt reports daily use of both, state alcohol has recently been beer but he was up to over a fifth of black velvet per day, would drink till he passed out; also reports using opiates, getting suboxone films and/or crushing and snorting opiates, 8-12 mg per day; pt last here in Nov 2019, states he relapsed within a few hours of discharge, was to embarrassed to come back so he went to [facility] for detox, states he did enter [facility] residential treatment for 2 month and completed the program, chose not to participate in their transitional program and relapsed 2 days after leaving [facility]; pt does not know if he wants to go back to [place], states he thinks he needs residential treatment farther from the [place] area; pt reports his family in [place] do not know he has relapsed, he wants to move back home but they will not accept him back until he has been clean at least 6 months    Electronically signed by Betsey Amen, LISW on 11/08/2018 at 9:23 AM       11/08/18 0913   Psychiatric History   Psychiatric history treatment Other  (admitted for detox from etoh and opiates; [facility]net hx here in Nov 2019; 2 months at [facility]; hx of psych hosp as child, no current tx)   Support System   Problems in support system Isolated   Current Living Situation   Home Living Adequate   Living information Lives with others  (friend)   Other government assistance [place] med[place]   Family [place]   Spouse/partner-name/age single   Children-names/ages 1 son who died from SIDS in 12/23/2012   Parents both in [place]   Siblings 1 sister   Childhood   Relevant family history pt born/raised in [place], pt hopes to return to [place] with family but has to prove he is clean for 5-6 months before they will let him come home   History of abuse No   Legal History   Legal history Yes  (alcohol related charges)    Abuse Assessment   Physical Abuse Denies   Verbal Abuse Denies   Emotional abuse  Denies   Financial Abuse Denies   Sexual abuse Denies   Substance Use   Use of substances  Yes   Substance 1   Substance used Alcohol   Amount/frequency/route up to 1+ fifth of black velvet per day as well as beer   Last use/History day of admission    Substance 2   Substance used  Opiates   Amount/frequency/route suboxone; reports using films and/or snorting 8-12mg  daily    Last use/History 2 days prior to admsision    Motivation for SA Treatment   Stage of engagement Pre-engagement/engagement   Motivation for treatment Yes   Comment pt relapsed 1 hr after last admission here in Nov 2019, then detoxed in [facility] and spent 2 months at [facility], relapsed 2 days after leaving [employer] Other (comment)  (10th grade)   Work History   Currently employed No   Leisure/Activity   Present interests fishing, walking   Social with friends/family Yes   Cultural and Spiritual   Spiritual concerns No   Cultural concerns No

## 2018-11-08 NOTE — Progress Notes (Signed)
Pt stated he had good effect from vistaril. Pt still admits to sweating and some muscle tension.

## 2018-11-08 NOTE — H&P (Signed)
ADDICTION MEDICINE   [facility] H [facility] P     Sunday,  November 08, 2018     NAME:        Bradley Calhoun   August 12, 1991       DATE OF HOSPITAL ADMISSION:    November 07, 2018       REASON FOR HOSPITAL ADMISSION:  Alcohol & Opiate Dependence with Withdrawal    ED NOTE:   Bradley Calhoun is a 28 y.o. male whopresents to the emergency department evaluation of alcohol detox and Suboxone detox.  Patient states her last intake of alcohol was 9 a.m. and he had half a can of beer.  States intake of Suboxone was 2 days ago.  States typically he takes 8-12 mg of Suboxone a day.  2 days ago, he to 4 mg of Suboxone.  Patient does not have any obvious tremors, is not tachycardic, denies any headaches, chest pain.  States typically, he drinks about one case of beer a day.  I did be using Suboxone that he gets from [employer] approximate 2 months ago.  Prior to that, in November, states that he was admitted in [place]. Bradley Calhoun for alcohol detox.  Ill psych he has been relapsing due to negative influence and hanging out with "wrong crowd" he denies any suicidal or homicidal ideation.  Denies any depression.  Patient is alert and oriented ??3, calm and cooperative.  States this time, he is really motivated to detox because he really wants to have a better relationship with his family members.  She denies no other concerning symptoms    IDENTIFYING INFO:    Bradley Calhoun is known to our [facility] staff from admission in November, 2019; Notes from that admission:  Social Work:  28 yo single male admitted for alcohol dependence and withdrawal. Pt has a hx of alcohol use dating back many years. Pt reports he first started drinking at age 4, and his use was problematic right away. Prior to this admission pt was drinking 2/5 of liquor and 3-4, 25oz beers daily. He also has been using $100-200 worth of crack cocaine daily. He has a hx of daily opiate use but stopped using 3 months ago.  Pt has been detoxed at [facility] in [place], a facility called  [facility] in [place] and [facility] in [place]. He reports he also completed residential tx at the [facility] and followed up with their IOP. Pt has been on Subutex and Suboxone through [facility] and [facility] respectively.   ??  Pt is currently living in [place] with a friend who pt reports is dealing. Pt understands he is at high risk of relapse if he returns to this environment. He has no family support in the area, he reports his entire family lives in [place]. Pt is unemployed. He has no current legal issues.  ??  At discharge pt is interested in residential treatment and would benefit from this level of care. As he stabilizes, SW will discuss tx options with him and assist him in developing a recovery plan.  ??            Social work note at time of discharge:  Pt is being discharged home today. He was referred to Bradley Calhoun and reports intent to follow up with them independently. Pt understands if he stays another day we may be able to facilitate a direct admission, pt is unwilling to do this. Bradley Calhoun contact number added to pt's  follow up. Pt will be taking the bus home. He has a SARTA pass but needs to get the transit station in [place] first. Pt will be provided with one Metro pass. Pt denies SI/HI/SIB. Pt has no additional SW needs. Pt provided with unit contact information for further consultation after discharge. Electronically signed by Bradley Rising, LISW on 07/13/2018 at 12:19 PM           CURRENT:    He returns today to [facility] for help with sbxne and alcohol w/drawal; his motivation is his family:  "they'll welcome me home to [place] if I've been abstinent for 6 months; I can't go home this way".  He reports that when d/c from [facility] in November, he began using immediately:  "you wanted me to stay an xra day and go direct to [facility] but I said no; I used right away"  He then was admitted to [facility] for detoxification:  "I went to [facility] direct from there".  He completed and  graduated from [facility] after 2 months; he was recommended to their 1/2 [employer], but again, he refused; two days later he began using alcohol.    In the past, his addiction has been primarily to Opiates along with cocaine; he has been successful with [facility] MAT while in [place].    Currently, over past few months:  he began drinking fifths of Vodka after leaving [facility] he then transitioned to 24oz beers, 10-15/day;  He's been buying sbxne off the streets averaging 8-12mg  per day.  He's been snorting cocaine, but sporadically  Last use of alcohol was 24hrs ago  Last use of sbxne/cocaine was 2 days ago.    His hx is significant for past IV use, + Blackouts, + hx of delerium         HEALTH ISSUES:  Noted in chart:  HTN, however this may be directly r/t influence of alcohol/drugs        Pertinent Abnormal Labs:      UTOX + for cocaine and buprenorphine; BAL: nondetectable    PSYCHIATRIC ISSUES:  No issues other than the emotional and psychosocial effects of chronic severe addiciton       CHEMICAL DEPENDENCY HISTORY:  [x] ? INPATIENT REHABILATATION[facility] days in [place] - started drinking again day of discharge  [] ? CHEMICAL DEPENDENCY IOP: [facility] ( d/c because of possession etoh), [facility] ( 1-48months but was still getting high and drinking daily)  [x] ? DETOXIFICATIONS: [facility] in [place], [facility] in [place], [facility] (d'ced because of possessing etoh  [] ? 12 STEP MEETINGS: not in [place], says he has somewhat of a sponsor.  [x] ? MEDICATION ASSISTED TREATMENT: [facility] ( dismissed carrying ETOH), [facility] (1-2 months but was still getting high and drinking daily)       LEGAL ISSUES:  Denies current / Pending BUT HAS HAD LEGAL/JAIL TIME IN PAST           FAMILY HX: Uncertain              REVIEW OF SYSTEMS    NEURO:   Denies seizures/Neuropathies; POSITIVE HX OF DELERIUM & bLACKOUTS  SKIN:        Denies issues     HEAD:  Denies Headaches/trauma;   EYES:   Denies  problems  EARS;   Denies hearing problems  NOSE:  Denies issues  MOUTH: Has own teeth/Denies lesions  THROAT: Denies issues  HEART:    Denies  known problems; Denies chest pain  LUNGS:   Denies SOB  GI         :   Nausea r/t withdrawal  MUSCLES/JOINTS/BONES:  Denies issues with strength/movement; c/o muscle aches        PHYSICAL EXAM  VS in ED:   T 97 R 80 133/99  VS Current:   T 97.7 R 16 P 96 118/96  Appears much older than stated age; balding; short beard moustache;  Hygeine good;  Pleasant mood with agreeable affect;  Fully oriented, coherent, lucid and with good insight into the seriousness of the addiction and his need to follow guidance.  No evidence of suicidality    Neuro:  NO Tremors; NO fidgeting; NO restlessness; NO agitation; NO twitching    Gait:     Stable    Skin:     NO diaphoresis; NO abnormalities    Eyes:  NO abnormalities    ORAL/Nasal Mucosa:  NO rhinnorrhea; NO abnormalities       Cardiac:   Radial Pulses Equal; NO tachy/brady cardia    Respiratory:  NO abnormalities; NO labored Breathing    Cognitions:  Fully oriented & Coherent; No evidence of delusions    Muscles/Skelatal:  No abnormalities     Psychiatric/Emotional: Stable; no agitation; no suicidaltiy      PLAN/RECOMMENDATION:  Sharrod clearly has been suffering for a long time with polysubstance dependence;  He's been unable to refrain or stop his use despite a desire to do so and in the face of multiple negative consequences;  He's been struggling to control and minimize his use but comes to treatment for help to prepare him to return home.  He is not, at this time, experiencing major w/drawal as he has started on tramadol and phenobarbital    FOR W/D:  Phenobarb 97.2mg  q 4 Holding if asleep / overly sedated  Thiamine 100mg  / day  Tramadol taper; adjunct meds     FOR ADDICTION  Counsel on the need to follow guidance, do as he is guided to do;  Assist with transition to next level of care: either return to [facility] or to a facility  in [place] near family      [place] Sink Ossie Beltran DNP APRN [facility]

## 2018-11-08 NOTE — Progress Notes (Signed)
Pt stated he good effect from zofran and tylenol. Pt at 11am had vistaril for anxiousness and had fair effect. Pt took 2pm meds, states he is still having sweats, pt informed he is still on a taper for tramadol and phenobarb and should improve as each dose is taken. Pt up social in dayhall

## 2018-11-08 NOTE — Progress Notes (Signed)
Pt up ad lib, med compliant, pt requested and given prn tylenol for general body aches 7/10 and prn zofran for nauseau. No si.hi, no halluciantions. Pt states he slept well

## 2018-11-09 MED ORDER — LISINOPRIL 10 MG PO TABS
10 MG | Freq: Every day | ORAL | Status: DC
Start: 2018-11-09 — End: 2018-11-13
  Administered 2018-11-09 – 2018-11-12 (×4): 10 mg via ORAL

## 2018-11-09 MED ORDER — GABAPENTIN 100 MG PO CAPS
100 MG | Freq: Three times a day (TID) | ORAL | Status: DC | PRN
Start: 2018-11-09 — End: 2018-11-13
  Administered 2018-11-09 – 2018-11-13 (×11): 100 mg via ORAL

## 2018-11-09 MED ORDER — PHENOBARBITAL 32.4 MG PO TABS
32.4 MG | ORAL | Status: AC
Start: 2018-11-09 — End: 2018-11-10
  Administered 2018-11-09 – 2018-11-11 (×9): 64.8 mg via ORAL

## 2018-11-09 MED FILL — LISINOPRIL 10 MG PO TABS: 10 mg | ORAL | Qty: 1

## 2018-11-09 MED FILL — ACETAMINOPHEN 325 MG PO TABS: 325 mg | ORAL | Qty: 2

## 2018-11-09 MED FILL — PHENOBARBITAL 97.2 MG PO TABS: 97.2 mg | ORAL | Qty: 1

## 2018-11-09 MED FILL — VITAMIN B-1 100 MG PO TABS: 100 mg | ORAL | Qty: 1

## 2018-11-09 MED FILL — FOLIC ACID 1 MG PO TABS: 1 mg | ORAL | Qty: 1

## 2018-11-09 MED FILL — THERA-M PO TABS: ORAL | Qty: 1

## 2018-11-09 MED FILL — BACLOFEN 10 MG PO TABS: 10 mg | ORAL | Qty: 1

## 2018-11-09 MED FILL — PHENOBARBITAL 32.4 MG PO TABS: 32.4 mg | ORAL | Qty: 2

## 2018-11-09 MED FILL — TRAMADOL HCL 50 MG PO TABS: 50 mg | ORAL | Qty: 2

## 2018-11-09 MED FILL — TRAZODONE HCL 100 MG PO TABS: 100 mg | ORAL | Qty: 1

## 2018-11-09 MED FILL — GABAPENTIN 100 MG PO CAPS: 100 mg | ORAL | Qty: 1

## 2018-11-09 MED FILL — NICOTINE 21 MG/24HR TD PT24: 21 mg/(24.h) | TRANSDERMAL | Qty: 1

## 2018-11-09 NOTE — Care Coordination-Inpatient (Signed)
Followed up with pt to discuss tx after discharge. Pt reports he wants to go to a residential program where he can get Suboxone and vented frustrations that he is being detoxed from Suboxone while here. Explained to pt that because he does not have a current prescriber, he is not going to start Suboxone while in the hospital. Also discussed with pt that there is no guarantee residential tx programs will start him on Suboxone while in treatment and it is recommended he go to residential, and if needed after completing the program, he follow up with MAT. Pt reports MAT is his number one priority and he prefers to stay with a friend in [place], who pt reports is sober, if he can be set up with Suboxone and get started quickly after discharge. Pt will be referred to [facility] as this facility has the ability to start MAT likely the day after he is discharged from detox. Pt signed an ROI and an appointment will be scheduled with [facility] to start services.  Electronically signed by [name], LISW on 11/09/2018 at 12:21 PM

## 2018-11-09 NOTE — Progress Notes (Signed)
Patient observed in day room eating breakfast. Patient A&Ox4. Tolerating meals and fluids well. Cooperative with care and compliant with medications. Patient states that he is social with peers and attending meetings. Patient denies SI/HI/AV hallucinations. No c/o pain or discomfort. Patient encouraged to contact staff with any questions or concerns. Safety maintained.

## 2018-11-09 NOTE — Progress Notes (Signed)
Rita CNP aware of patient's increased BP.

## 2018-11-09 NOTE — Progress Notes (Signed)
Spiritual Focus Group Note      Number of participant : 6         Duration : 30 Minutes          Group type/ Name : Spiritual Focus Group           Description : Patients will be invited to participate in topic based discussion, submit prayer request, and receive faith based reading material.  One-on one follow up is offered.      Patient Response : Patient participated in discussion, submitted prayer request, and was offered follow up visit.

## 2018-11-09 NOTE — Progress Notes (Signed)
PROGRESS NOTE; ADDICTION MEDICINE UNIT      Monday, November 09, 2018                 SEEN TO ASSESS STATUS OF ALCOHOL & Opiate W/D AND RESPONSE TO MEDICATIONS AND PLANS FOR RECovery    General:  When seen and assessed:    Alert and Fully oriented; coherent;  Pleasant mood with agreeable affect.    He expressed the intent to get into a MAT program; prefers a residential facility.  We discussed options near where he intends to reside; he made phone calls to [facility] and [facility];   Said that [facility] is closer to where he'll live, [facility] is in [place] which would be more difficult to get to.  He is working with Social Work to schedule an app't      Withdrawal Assessment:    SUBJECTIVE:  C/O diaphoresis and hot/cold sensations    OBJECTIVE:  No overt s/s of alcohol or opiate withdrawal other than fear of impending w/drawal  Tramadol taper will be complete today  Phenobarb to be tapered to 64.8mg  q 4hrs thru tonite then further tapered tomorrow      VITALS:   148/110 p 93 T 98.8  BP has been elevated consistently;  He will restart lisinopril 10mg /d      ADDICTION:  Insight into addiction is fair; he struggles with following guidance      EXAM:     Neuro:  NO Tremors; NO fidgeting; NO restlessness; NO agitation; NO twitching    Gait:     Stable    Skin:     NO diaphoresis; NO abnormalities    Eyes:  NO abnormalities    ORAL/Nasal Mucosa:  NO rhinnorrhea; NO abnormalities       Cardiac:   Radial Pulses Equal; NO tachy/brady cardia    Respiratory:  NO abnormalities; NO labored Breathing    Cognitions:  Fully oriented & Coherent; No evidence of delusions    Muscles/Skelatal:  No abnormalities                PLAN:  As noted above     Shad Ledvina HANSCHOCK DNP APRN-NP  [facility]

## 2018-11-09 NOTE — Progress Notes (Signed)
Nutrition rescreen completed. Chart reviewed. Patient to be monitored and followed by the diet technician.

## 2018-11-09 NOTE — Progress Notes (Signed)
The patient has been compliant with care plan and his medication regiment.  He has been going to scheduled meetings being involved with appropriated conversation with other clients and staff members.  Pt stated his LBM was today and is urinating regularly.  Pt has been eating and drinking fluids adequately.

## 2018-11-09 NOTE — Progress Notes (Signed)
PT has been resting and sleeping in his bed the entire shift.  Pt has tolerated PO intake and treatment well. Pt slept well throughout the night, his tooth pain did wake him once or twice, easily aroused for med administration. Pt is cooperative and pleasant, will continue to monitor. PRN tylenol given did help with his tooth pain, pt did state he was going to need to see a dentist after discharge.

## 2018-11-09 NOTE — Progress Notes (Signed)
Activity Therapy Group    Duration: 25 minutes    Number of Participants: 6    Group Type: Recreation Therapy    Group Title: Recreation Therapy    Group Goal: Support Completion of Medical Detox    Group Description: Gentle Stretch - To allow Patients the opportunity to engage in gentle stretching from a chair. During the intervention Patients are encouraged to be present in the movements as a means to connect mind and body. Patients are expected to practice self care by not pushing there limits and instead working within them. Support is offered as needed to meet this expectation.    Patient Response: Patient had good eye contact and was able to meet expectations for group without support required. Patient denied any discomfort following the intervention and reported ability to respect their physical limits.  Patient reported improvement compared to the start of group. Continue to encourage group participation as appropriate.      Bradley Calhoun, CTRS

## 2018-11-09 NOTE — Discharge Instructions (Signed)
   After detox you should abstain from any use of any mood altering chemical   Appointment with your primary care physician should be scheduled   It is highly recommended that you attend post hospital treatment   Please read the information give to you - Intro to 12 step programs   Call the National Suicide Prevention Hotline if needed at: 1-800-273-TALK (8255)     Please call the following number should you have questions regarding your discharge or aftercare appointments:   [address]   [phone]

## 2018-11-09 NOTE — Progress Notes (Signed)
Paged Dr. Tamsen Roers and made him aware of the elevated BP's this morning. Did not that he used to take Lisinopril in the past ( not recent) when at [facility] for elevated BP. Dr. Tamsen Roers ordered nothing new right now, did state to tell Dr. Levada Dy once he comes in for day shift.

## 2018-11-10 MED ORDER — CALCIUM CARBONATE ANTACID 500 MG PO CHEW
500 MG | Freq: Three times a day (TID) | ORAL | Status: DC | PRN
Start: 2018-11-10 — End: 2018-11-13
  Administered 2018-11-10: 21:00:00 500 mg via ORAL

## 2018-11-10 MED ORDER — PHENOBARBITAL 32.4 MG PO TABS
32.4 MG | Freq: Three times a day (TID) | ORAL | Status: DC
Start: 2018-11-10 — End: 2018-11-10

## 2018-11-10 MED ORDER — PHENOBARBITAL 32.4 MG PO TABS
32.4 MG | Freq: Four times a day (QID) | ORAL | Status: AC
Start: 2018-11-10 — End: 2018-11-11
  Administered 2018-11-11 – 2018-11-12 (×4): 32.4 mg via ORAL

## 2018-11-10 MED FILL — PHENOBARBITAL 32.4 MG PO TABS: 32.4 mg | ORAL | Qty: 2

## 2018-11-10 MED FILL — ACETAMINOPHEN 325 MG PO TABS: 325 mg | ORAL | Qty: 2

## 2018-11-10 MED FILL — GABAPENTIN 100 MG PO CAPS: 100 mg | ORAL | Qty: 1

## 2018-11-10 MED FILL — BACLOFEN 10 MG PO TABS: 10 mg | ORAL | Qty: 1

## 2018-11-10 MED FILL — NICOTINE 21 MG/24HR TD PT24: 21 mg/(24.h) | TRANSDERMAL | Qty: 1

## 2018-11-10 MED FILL — FOLIC ACID 1 MG PO TABS: 1 mg | ORAL | Qty: 1

## 2018-11-10 MED FILL — VITAMIN B-1 100 MG PO TABS: 100 mg | ORAL | Qty: 1

## 2018-11-10 MED FILL — THERA-M PO TABS: ORAL | Qty: 1

## 2018-11-10 MED FILL — TRAMADOL HCL 50 MG PO TABS: 50 mg | ORAL | Qty: 2

## 2018-11-10 MED FILL — LISINOPRIL 10 MG PO TABS: 10 mg | ORAL | Qty: 1

## 2018-11-10 MED FILL — CALCIUM CARBONATE ANTACID 500 MG PO CHEW: 500 mg | ORAL | Qty: 1

## 2018-11-10 NOTE — Care Coordination-Inpatient (Addendum)
Pt reports he does not want to go to [facility] because the office is too far from where he will be staying in [place]. He would like to return to [facility], ROI Signed, call placed to [facility], pt scheduled for an assessment on 4/2 at 11:30. Appointment information added to follow up.  Electronically signed by [name], LISW on 11/10/2018 at 10:46 AM

## 2018-11-10 NOTE — Progress Notes (Incomplete)
The patient has been compliant with care plan and his medication regiment.  He has been going to scheduled meetings being involved with appropriated conversation with other clients and staff members.  Pt stated his LBM was today and is urinating regularly.  Pt has been eating and drinking fluids adequately.

## 2018-11-10 NOTE — Progress Notes (Signed)
PROGRESS NOTE; ADDICTION MEDICINE UNIT      Tuesday, November 10, 2018                 SEEN TO ASSESS STATUS OF ALCOHOL & Opiate W/D AND RESPONSE TO MEDICATIONS AND PLANS FOR RECovery    General:  When seen and assessed:      Alert and Fully oriented; coherent;  Interacting with peers in day room playing cards.  Euthymic mood with appropriate concerned affect      Withdrawal Assessment:    SUBJECTIVE:  " I feel like shit"; he describes Hot/cold sweats, muscle aches and unusual body sensations; "it's hard to explain, like i'm a rubber ball bouncing"    OBJECTIVE:  He is not showing any overt s/s of withdrawal but as noted above, is certainly complaining of such.    Phenobarbital at 64.8 mg q 4 hrs thru today with taper to 32.4mg  q 6 hrs tomorrow    VITALS:   139/96 P 86 R 17 T 98.9      ADDICTION:  Insight into addiction is fair; he is aware of its power over his thinking but not able to follow guidance and direction.  He is intent on MAT/Sbxne; feels [facility] would be close to where he will be living.  Ultimate goal is to remain abstinent (maybe even sober) for 6 months than return to family in [place]. (family insists that he maintain abstinence before return); he's been involved in MAT/Sbxne when in [place]. in past.      Our SWorker set an app't for him at [facility] for November 26, 2018 in [place];   He now says he's "thinking about staying in [place], away from the people I know in [place], I'd stay at the [facility]".  As his goal is return to [facility] I questioned why he would not return now, reside in a shelter and resume MAT.  He answered:  "I'm not allowed to go to a homeless shelter there because of violent arrests" (he did not elaborate).    He's vascillated on where he wants to go; says ideally he'd prefer a residential tx center which would give him suboxone but understands that's not readily available.  He was given info on [facility] and advised to call for app't.      EXAM:     Neuro:  Some  restlessness noted but more emotional    Gait:     Stable    Skin:     NO diaphoresis; NO abnormalities    Eyes:  NO abnormalities    ORAL/Nasal Mucosa:  NO rhinnorrhea; NO abnormalities       Cardiac:   Radial Pulses Equal; NO tachy/brady cardia    Respiratory:  NO abnormalities; NO labored Breathing    Cognitions:  Fully oriented & Coherent; No evidence of delusions    Muscles/Skelatal:  No abnormalities                PLAN:  Taper phenobarb as noted above               Tramadol taper at Phase 3                Taking prn gabapentin for anxiety     Bradley Baze HANSCHOCK DNP APRN-NP  [facility]

## 2018-11-10 NOTE — Progress Notes (Signed)
-  Eating full meals. Taking in fluids.   -Attended groups/meetings.  -Given tums, maalox, gabapentin as needed.   -Encouraged to make needs known and report changes in condition.

## 2018-11-11 MED ORDER — PHENOBARBITAL 16.2 MG PO TABS
16.2 MG | Freq: Three times a day (TID) | ORAL | Status: AC
Start: 2018-11-11 — End: 2018-11-12
  Administered 2018-11-12 – 2018-11-13 (×3): 16.2 mg via ORAL

## 2018-11-11 MED ORDER — BACLOFEN 10 MG PO TABS
10 MG | Freq: Two times a day (BID) | ORAL | Status: DC
Start: 2018-11-11 — End: 2018-11-13
  Administered 2018-11-12 – 2018-11-13 (×3): 10 mg via ORAL

## 2018-11-11 MED FILL — PHENOBARBITAL 32.4 MG PO TABS: 32.4 mg | ORAL | Qty: 2

## 2018-11-11 MED FILL — TRAZODONE HCL 100 MG PO TABS: 100 mg | ORAL | Qty: 1

## 2018-11-11 MED FILL — ACETAMINOPHEN 325 MG PO TABS: 325 mg | ORAL | Qty: 2

## 2018-11-11 MED FILL — PHENOBARBITAL 32.4 MG PO TABS: 32.4 mg | ORAL | Qty: 1

## 2018-11-11 MED FILL — BACLOFEN 10 MG PO TABS: 10 mg | ORAL | Qty: 1

## 2018-11-11 MED FILL — GABAPENTIN 100 MG PO CAPS: 100 mg | ORAL | Qty: 1

## 2018-11-11 MED FILL — LISINOPRIL 10 MG PO TABS: 10 mg | ORAL | Qty: 1

## 2018-11-11 MED FILL — FOLIC ACID 1 MG PO TABS: 1 mg | ORAL | Qty: 1

## 2018-11-11 MED FILL — VITAMIN B-1 100 MG PO TABS: 100 mg | ORAL | Qty: 1

## 2018-11-11 MED FILL — THERA-M PO TABS: ORAL | Qty: 1

## 2018-11-11 MED FILL — NICOTINE 21 MG/24HR TD PT24: 21 mg/(24.h) | TRANSDERMAL | Qty: 1

## 2018-11-11 NOTE — Progress Notes (Signed)
Up and social with peers. Did not attend activities this afternoon. In bed off and on. Cooperative with staff.

## 2018-11-11 NOTE — Progress Notes (Signed)
PROGRESS NOTE; ADDICTION MEDICINE UNIT      Wednesday, November 11, 2018                 SEEN TO ASSESS STATUS OF Opiate & ALCOHOL W/D AND RESPONSE TO MEDICATIONS AND PLANS FOR RECovery    General:  When seen and assessed:    Alert and Fully oriented; coherent;  Euthymic mood with concerned affect.    He is planning to seek MAT / Sbxne at [facility];  Scheduled app't on Monday 3/23;   Talked with insurance rep at [place] regarding transportation.  He will be discharged to [facility] in [place] as he does not want to return to [place].  He previously was scheduled to see [facility] in [place], app't not cancelled as yet in case he changes plans.      Withdrawal Assessment:    SUBJECTIVE:  Admits to some "internal shaking" but feeling better than yesterday    OBJECTIVE:  In observing, he continues to exhibit mild gross hand tremors;  NO diaphoresis      VITALS:   97.7 P 92 R 12 130/87      ADDICTION:  Taking action to make calls and app'ts on his own.  Ultimately wants to remain "clean" on MAT to be able to return home to family      EXAM:     Neuro:  Mild hand tremors continue;  NO fidgeting; NO restlessness; NO agitation; NO twitching    Gait:     Stable    Skin:     NO diaphoresis; NO abnormalities    Eyes:  NO abnormalities    ORAL/Nasal Mucosa:  NO rhinnorrhea; NO abnormalities       Cardiac:   Radial Pulses Equal; NO tachy/brady cardia    Respiratory:  NO abnormalities; NO labored Breathing    Cognitions:  Fully oriented & Coherent; No evidence of delusions    Muscles/Skelatal:  No abnormalities             PLAN:  Tramadol taper ended yesterday               Phenobarb taper is at 32mg  q 6 thru today with taper to 16mg  q 8 x 3 tomorrow then stop     Cosme Jacob HANSCHOCK DNP APRN-NP  [facility]

## 2018-11-11 NOTE — Care Coordination-Inpatient (Addendum)
Pt reports he changed his mind again and wants to go to [facility]. Pt informed that appointment at [facility] will remain so he can have an assessment there to get into residential treatment which is our recommended LOC.  Electronically signed by [name], LISW on 11/11/2018 at 11:55 AM    Pt rescheduled his appointment at [facility] for Friday morning at 8:00 and has made arrangements for his insurance to pick him up at 7:45 for this appointment. Will have physician put in d/c order tomorrow for pt to d/c early on Friday.  Electronically signed by [name], LISW on 11/11/2018 at 2:39 PM

## 2018-11-12 MED FILL — FOLIC ACID 1 MG PO TABS: 1 mg | ORAL | Qty: 1

## 2018-11-12 MED FILL — VITAMIN B-1 100 MG PO TABS: 100 mg | ORAL | Qty: 1

## 2018-11-12 MED FILL — BACLOFEN 10 MG PO TABS: 10 mg | ORAL | Qty: 1

## 2018-11-12 MED FILL — ACETAMINOPHEN 325 MG PO TABS: 325 mg | ORAL | Qty: 2

## 2018-11-12 MED FILL — PHENOBARBITAL 32.4 MG PO TABS: 32.4 mg | ORAL | Qty: 1

## 2018-11-12 MED FILL — LISINOPRIL 10 MG PO TABS: 10 mg | ORAL | Qty: 1

## 2018-11-12 MED FILL — NICOTINE 21 MG/24HR TD PT24: 21 mg/(24.h) | TRANSDERMAL | Qty: 1

## 2018-11-12 MED FILL — GABAPENTIN 100 MG PO CAPS: 100 mg | ORAL | Qty: 1

## 2018-11-12 MED FILL — THERA-M PO TABS: ORAL | Qty: 1

## 2018-11-12 MED FILL — TRAZODONE HCL 100 MG PO TABS: 100 mg | ORAL | Qty: 1

## 2018-11-12 MED FILL — PHENOBARBITAL 16.2 MG PO TABS: 16.2 mg | ORAL | Qty: 1

## 2018-11-12 NOTE — Progress Notes (Signed)
Patient A O x 4, social, pleasant and cooperative, up ad lib, gait steady, breathe easy and unlabored, c/o pain, no SI/HI/ hallucination reported, voices anxiety. PRN tylenol and gabapentin given. Encouraged to make needs known, will support and maintain safety.

## 2018-11-12 NOTE — Progress Notes (Signed)
PATIENTTAHJI, DURHAM  MEDICAL RECORD NUMBER:          [id number]  ADMISSION DATE:           11/07/2018  DATE OF EVAL:             11/12/2018  ACCOUNT#:                 [id number]  RACE:                     W  SEX:                      M  LOCATION:                 [id number]  DATE OF BIRTH:            [birth year]  AGE:                      28  ADMITTING PHYSICIAN:      Wayland Denis, MD  ATTENDING PHYSICIAN:      Wayland Denis, MD  DICTATING PHYSICIAN:      Lajuan Lines. Celedonio Miyamoto, MD  PCP:                      CHEMICAL DEPENDENCE PROGRESS NOTE    HISTORY OF PRESENT ILLNESS:  The patient progressing well in the  detoxification.  He remains on detox regimen for alcohol and opiate  withdrawal.  Appetite intact.  Denies any nausea, chills, sweats,  joint or muscle pain.    PHYSICAL EXAMINATION:  Vital signs: Blood pressure 116/77, pulse 95,  respiratory rate 19, and temperature 98.  The patient is not flushed  or diaphoretic.  Oriented times four.  He is in good spirits.  No  auditory, tactile, or visual disturbances.    The case was discussed with treatment team.  Also met with patient.    PLAN:  Plan is for this patient to be discharged in the morning with  planned followup appointment at [facility].  Discharge order in place.  Will monitor.    I did spend over 51% total time 26 minutes coordinating care,  providing discussion regarding this patient's psychiatric, medical,  and chemical dependency history, and discussion of treatment plan.          Lajuan Lines. Celedonio Miyamoto, MD    DOD:11/12/2018 12:18 P  AHS/mb  DOT:11/12/2018 01:03 P  Document Number: [id number]  Job Number: [id number]    cc:   Wayland Denis, MD        [facility]        [address]        [place] [address]

## 2018-11-12 NOTE — Progress Notes (Signed)
Pt has been polite and appropriate with nurse and cooperative with meds and routine care. Pt has been social with peers but anxious and somewhat restless this evening. Complained of head ache but states it was decreased after taking scheduled Baclofen. Pt states he has been eating well and taking fluids.   Pt was soundly asleep by 1130 pm.

## 2018-11-12 NOTE — Progress Notes (Signed)
Up in day room most of day, social and appropriate with peers. Cooperative with staff. No meetings to attend.   States feeling better at this time. Headache is gone. Feels more relaxed at this time. States ready to go and understands will be leaving early in morning to go to [facility].

## 2018-11-12 NOTE — Progress Notes (Signed)
Patient reports fairly good sleep and decreased tooth pain this am.

## 2018-11-12 NOTE — Progress Notes (Signed)
Patient reports PRN tylenol and gabapentin were effective.

## 2018-11-12 NOTE — Progress Notes (Signed)
CD progress note dictated---#[id number]

## 2018-11-12 NOTE — Progress Notes (Signed)
Reviewed with mark and other patients in day room hand washing as preventative of any virus and infection.  Patient social and appropriate with peers.

## 2018-11-13 MED FILL — PHENOBARBITAL 16.2 MG PO TABS: 16.2 mg | ORAL | Qty: 1

## 2018-11-13 MED FILL — GABAPENTIN 100 MG PO CAPS: 100 mg | ORAL | Qty: 1

## 2018-11-13 NOTE — Progress Notes (Signed)
Patient looking forward to discharge, he is going to f/u with [facility].

## 2018-11-13 NOTE — Discharge Summary (Signed)
Discharge summary dictated----#[id number]

## 2018-11-13 NOTE — Progress Notes (Signed)
Discharge instructions given and explained to patient. Verbalized understanding. Voiced no concerns. Denied SI. Advised to abstain from addictive substance and risks of overdose/relapse. Belongings returned. Walked to security for other belongings.

## 2018-11-13 NOTE — Other (Unsigned)
Patient Acct Nbr: [id number]   Primary AUTH/CERT: [id number]  Primary Insurance Company Name: [facility]  [employer] Plan name: Luther Redo [facility]  Primary Insurance Group Number: [id number]  Primary Insurance Plan Type: Health  Primary Insurance Policy Number: [id number]

## 2018-11-13 NOTE — Discharge Summary (Signed)
PATIENTCORDY, BOX  MEDICAL RECORD #:         [id number]  ADMISSION DATE:           11/07/2018  DISCHARGE DATE:           11/13/2018  ACCOUNT #:                [id number]  DATE OF BIRTH:            1991/06/27  AGE:                      28  ADMITTING PHYSICIAN:      Ewing Schlein, MD  ATTENDING PHYSICIAN:      Ewing Schlein, MD  DICTATING PHYSICIAN:      Guadlupe Spanish. Levada Dy, MD                           [facility] DISCHARGE SUMMARY    ATTENDING PHYSICIAN:  Ewing Schlein, MD    DICTATING PHYSICIAN:  Guadlupe Spanish. Levada Dy, MD    IDENTIFICATION:  The patient is a 28 year old male presenting to [facility] for treatment of alcohol, opiate withdrawal.    HISTORY OF PRESENT ILLNESS:  Prior to coming into the hospital, the  patient was consuming a fifth of [employer] daily.  He also was  using Suboxone film which he crushed and snorted, using 8 to 12 mg per  day.  There is a clear history of loss of control, continued use  despite adverse consequences, and inability to abstain successfully on  his own.  Withdrawal positive for anxiety, nausea, vomiting, chills,  sweats, joint and muscle pain.    PREVIOUS TREATMENT FOR CHEMICAL DEPENDENCY:  He was detoxified in  November 2019 at [facility] with minimal ensuing sobriety.  More recently he has been detoxified at [facility] and entered [facility].  He completed the program, but relapsed two days after leaving  [employer]    PAST MEDICAL HISTORY:  Prior to coming into the hospital, he was not  on any specific medications for any medical disorders.    LABORATORY STUDIES:  Sodium 136, potassium 4.4, chloride 95, glucose  91, BUN 8, creatinine 0.84, AST 56, ALT 33.   CBC unremarkable.  Blood  alcohol level less than 0.010.  Drug screen was positive for  buprenorphine and cocaine.    HOSPITAL COURSE:  The patient was admitted to the Chemical Dependency  Unit and placed in detox status.  He was placed on phenobarbital for  alcohol  withdrawal syndrome and tramadol regimen for opiate  withdrawal.  Detoxification proceeded relatively uneventfully.  He did  safely complete detoxification on 11/13/2018 with plans to transition  to the treatment program at [facility].      PHYSICAL EXAMINATION:  At the time of discharge, he was in good  spirits.  No acute signs of alcohol or opiate withdrawal.  No suicidal  or homicidal ideation.      PLAN:  Hopefully he will follow up with the [facility] program.      Guadlupe Spanish. Levada Dy, MD    DOD:11/30/2018 12:42 P  AHS/vjw  DOT:11/30/2018 01:06 P  Job Number: [id number]  Document Number: [id number]  cc:   Ewing Schlein, MD        [facility]        [address]        [place] [address]

## 2020-06-13 ENCOUNTER — Encounter (HOSPITAL_COMMUNITY): Payer: Self-pay

## 2020-06-13 ENCOUNTER — Emergency Department (HOSPITAL_COMMUNITY)
Admission: EM | Admit: 2020-06-13 | Discharge: 2020-06-14 | Disposition: A | Discharge status: Home / Self Care | Payer: Self-pay | Attending: Emergency Medicine | Admitting: Emergency Medicine

## 2020-06-13 DIAGNOSIS — R45851 Suicidal ideations: Secondary | ICD-10-CM | POA: Insufficient documentation

## 2020-06-13 DIAGNOSIS — F329 Major depressive disorder, single episode, unspecified: Secondary | ICD-10-CM | POA: Insufficient documentation

## 2020-06-13 DIAGNOSIS — F1020 Alcohol dependence, uncomplicated: Secondary | ICD-10-CM | POA: Insufficient documentation

## 2020-06-13 DIAGNOSIS — Z20822 Contact with and (suspected) exposure to covid-19: Secondary | ICD-10-CM | POA: Insufficient documentation

## 2020-06-13 DIAGNOSIS — R451 Restlessness and agitation: Secondary | ICD-10-CM | POA: Insufficient documentation

## 2020-06-13 LAB — COMPREHENSIVE METABOLIC PANEL
ALT: 34 U/L (ref 0–44)
AST: 32 U/L (ref 15–41)
Albumin: 4.7 g/dL (ref 3.5–5.0)
Alkaline Phosphatase: 47 U/L (ref 38–126)
Anion gap: 12 (ref 5–15)
BUN: 12 mg/dL (ref 6–20)
CO2: 27 mmol/L (ref 22–32)
Calcium: 9.3 mg/dL (ref 8.9–10.3)
Chloride: 102 mmol/L (ref 98–111)
Creatinine, Ser: 0.92 mg/dL (ref 0.61–1.24)
GFR, Estimated: >60 mL/min (ref >60)
Glucose, Bld: 67 mg/dL — ABNORMAL LOW (ref 70–99)
Potassium: 4.1 mmol/L (ref 3.5–5.1)
Sodium: 141 mmol/L (ref 135–145)
Total Bilirubin: 0.6 mg/dL (ref 0.3–1.2)
Total Protein: 7.4 g/dL (ref 6.5–8.1)

## 2020-06-13 LAB — CBC
HCT: 46.5 % (ref 39.0–52.0)
Hemoglobin: 15.4 g/dL (ref 13.0–17.0)
MCH: 32.6 pg (ref 26.0–34.0)
MCHC: 33.1 g/dL (ref 30.0–36.0)
MCV: 98.5 fL (ref 80.0–100.0)
Platelets: 334 10*3/uL (ref 150–400)
RBC: 4.72 MIL/uL (ref 4.22–5.81)
RDW: 13.7 % (ref 11.5–15.5)
WBC: 9.7 10*3/uL (ref 4.0–10.5)
nRBC: 0 % (ref 0.0–0.2)

## 2020-06-13 LAB — SALICYLATE LEVEL: Salicylate Lvl: <7 mg/dL — ABNORMAL LOW (ref 7.0–30.0)

## 2020-06-13 LAB — RAPID URINE DRUG SCREEN, HOSP PERFORMED
Amphetamines: NOT DETECTED
Barbiturates: NOT DETECTED
Benzodiazepines: NOT DETECTED
Cocaine: POSITIVE — AB
Opiates: NOT DETECTED
Tetrahydrocannabinol: NOT DETECTED

## 2020-06-13 LAB — RESPIRATORY PANEL BY RT PCR (FLU A&B, COVID)
Influenza A by PCR: NEGATIVE
Influenza B by PCR: NEGATIVE
SARS Coronavirus 2 by RT PCR: NEGATIVE

## 2020-06-13 LAB — CBG MONITORING, ED: Glucose-Capillary: 138 mg/dL — ABNORMAL HIGH (ref 70–99)

## 2020-06-13 LAB — ACETAMINOPHEN LEVEL: Acetaminophen (Tylenol), Serum: <10 ug/mL — ABNORMAL LOW (ref 10–30)

## 2020-06-13 LAB — ETHANOL: Alcohol, Ethyl (B): 11 mg/dL — ABNORMAL HIGH (ref <10)

## 2020-06-13 MED ORDER — THIAMINE HCL 100 MG PO TABS
100.0000 mg | ORAL_TABLET | Freq: Every day | ORAL | Status: DC
  Administered 2020-06-13: 100 mg via ORAL
  Filled 2020-06-13: qty 1

## 2020-06-13 MED ORDER — LORAZEPAM 1 MG PO TABS
1.0000 mg | ORAL_TABLET | Freq: Once | ORAL | Status: AC
  Administered 2020-06-13: 1 mg via ORAL
  Filled 2020-06-13: qty 1

## 2020-06-13 MED ORDER — LORAZEPAM 1 MG PO TABS
0.0000 mg | ORAL_TABLET | Freq: Four times a day (QID) | ORAL | Status: DC
  Administered 2020-06-13: 2 mg via ORAL
  Filled 2020-06-13: qty 2

## 2020-06-13 MED ORDER — LORAZEPAM 2 MG/ML IJ SOLN: 0.0000 mg | Freq: Four times a day (QID) | INTRAMUSCULAR | Status: DC

## 2020-06-13 MED ORDER — LORAZEPAM 2 MG/ML IJ SOLN: 0.0000 mg | Freq: Two times a day (BID) | INTRAMUSCULAR | Status: DC

## 2020-06-13 MED ORDER — LORAZEPAM 1 MG PO TABS: 0.0000 mg | ORAL_TABLET | Freq: Two times a day (BID) | ORAL | Status: DC

## 2020-06-13 MED ORDER — THIAMINE HCL 100 MG/ML IJ SOLN: 100.0000 mg | Freq: Every day | INTRAMUSCULAR | Status: DC

## 2020-06-13 NOTE — BH Assessment (Signed)
Nira Conn, NP, psych cleared. Patient faxed resources for substance abuse providers for patient to follow up. Patient also given crisis number if needed.

## 2020-06-13 NOTE — ED Notes (Signed)
Pt wanded by security. 

## 2020-06-13 NOTE — ED Triage Notes (Signed)
Pt arrives to ED w/ c/o SI and wanting alcohol detox. Pt reports last drink last night. Pt reports he drinks about a fifvth a day. Pt denies suicidal intent, states last suicide attempt 5-7 years ago.

## 2020-06-13 NOTE — ED Notes (Signed)
Pt given snack and drink, will re-check bgl in 30 min per PA's request

## 2020-06-13 NOTE — BH Assessment (Signed)
Comprehensive Clinical Assessment (CCA) Note  06/13/2020 Bryan Harper [id number]   Bryan Harper is a 29 year old male [facility] with no plan and requesting alcohol detox. Patient denies suicide intent to clinician and to triage staff per medical record. Patient denied HI and psychosis. Patient reported onset of SI was 2 weeks ago. Patient unable to identify any triggers of SI. Patient reported drinking 5th - half a gallon of alcohol daily, last usage was today. Patient reported worsening depressive symptoms. Patient reported last psych inpatient tx was at [facility] 3 months ago for SI and depression. Patient reported last suicide attempt was approx 5 years ago where he attempted overdose and other incident where he jumped in front of a car.   Patient is not receiving any outpatient mental health services. Patient denied taking any psych medication. Patient stated he was currently residing with other people in the [address] [facility]". Patient is currently unemployed. Patient denied access to guns.   Disposition Nira Conn, NP, psych cleared. Patient faxed resources for substance abuse providers for patient to follow up. Patient also given crisis number if needed.   Visit Diagnosis:  Alcohol dependence and Major depressive disorder  CCA Screening, Triage and Referral (STR)  Patient Reported Information How did you hear about [place]? Self  Referral name: No data recorded Referral phone number: No data recorded  Whom do you see for routine medical problems? I don't have a doctor  Practice/Facility Name: No data recorded Practice/Facility Phone Number: No data recorded Name of Contact: No data recorded Contact Number: No data recorded Contact Fax Number: No data recorded Prescriber Name: No data recorded Prescriber Address (if known): No data recorded  What Is the Reason for Your Visit/Call Today? SI and alcohol detox  How Long Has This  Been Causing You Problems? 1 wk - 1 month  What Do You Feel Would Help You the Most Today? Medication;Therapy   Have You Recently Been in Any Inpatient Treatment (Hospital/Detox/Crisis Center/28-Day Program)? No  Name/Location of Program/Hospital:No data recorded How Long Were You There? No data recorded When Were You Discharged? No data recorded  Have You Ever Received Services From [facility] Before? No  Who Do You See at [facility]? No data recorded  Have You Recently Had Any Thoughts About Hurting Yourself? Yes  Are You Planning to Commit Suicide/Harm Yourself At This time? No   Have you Recently Had Thoughts About Hurting Someone [name]? No  Explanation: No data recorded  Have You Used Any Alcohol or Drugs in the Past 24 Hours? No  How Long Ago Did You Use Drugs or Alcohol? No data recorded What Did You Use and How Much? No data recorded  Do You Currently Have a Therapist/Psychiatrist? No  Name of Therapist/Psychiatrist: No data recorded  Have You Been Recently Discharged From Any Office Practice or Programs? No  Explanation of Discharge From Practice/Program: No data recorded    CCA Screening Triage Referral Assessment Type of Contact: Tele-Assessment  Is this Initial or Reassessment? Initial Assessment  Date Telepsych consult ordered in [facility]:  06/13/20  Time Telepsych consult ordered in [facility]:  2005   Patient Reported Information Reviewed? Yes  Patient Left Without Being Seen? No data recorded Reason for Not Completing Assessment: No data recorded  Collateral Involvement: none reported   Does Patient Have a Court Appointed Legal Guardian? No data recorded Name and Contact of Legal Guardian: No data recorded If Minor and Not Living  with Parent(s), Who has Custody? No data recorded Is CPS involved or ever been involved? Never  Is APS involved or ever been involved? Never   Patient Determined To Be At Risk for Harm To Self or Others Based on Review of  Patient Reported Information or Presenting Complaint? Yes, for Self-Harm  Method: No data recorded Availability of Means: No data recorded Intent: No data recorded Notification Required: No data recorded Additional Information for Danger to Others Potential: No data recorded Additional Comments for Danger to Others Potential: No data recorded Are There Guns or Other Weapons in Your Home? No data recorded Types of Guns/Weapons: No data recorded Are These Weapons Safely Secured?                            No data recorded Who Could Verify You Are Able To Have These Secured: No data recorded Do You Have any Outstanding Charges, Pending Court Dates, Parole/Probation? No data recorded Contacted To Inform of Risk of Harm To Self or Others: No data recorded  Location of Assessment: [facility] ED   Does Patient Present under Involuntary Commitment? No  IVC Papers Initial File Date: No data recorded  [place] of Residence: [place]   Patient Currently Receiving the Following Services: Not Receiving Services   Determination of Need: Emergent (2 hours)   Options For Referral: Chemical Dependency Intensive Outpatient Therapy (CDIOP);Inpatient Hospitalization;Medication Management     CCA Biopsychosocial  Intake/Chief Complaint:  CCA Intake With Chief Complaint Chief Complaint/Presenting Problem: SI and detox Individual's Strengths: self-awareness  Mental Health Symptoms Depression:  Depression: Fatigue, Change in energy/activity, Increase/decrease in appetite, Sleep (too much or little), Hopelessness, Tearfulness, Worthlessness, Duration of symptoms greater than two weeks  Mania:  Mania: None  Anxiety:   Anxiety: Fatigue, Restlessness, Sleep, Worrying, Tension  Psychosis:  Psychosis: None  Trauma:  Trauma: N/A  Obsessions:  Obsessions: None  Compulsions:  Compulsions: None  Inattention:  Inattention: None  Hyperactivity/Impulsivity:  Hyperactivity/Impulsivity: N/A  Oppositional/Defiant  Behaviors:  Oppositional/Defiant Behaviors: None  Emotional Irregularity:  Emotional Irregularity: None  Other Mood/Personality Symptoms:      Mental Status Exam Appearance and self-care  Stature:  Stature: Average  Weight:  Weight: Average weight  Clothing:  Clothing: Age-appropriate  Grooming:  Grooming: Normal  Cosmetic use:  Cosmetic Use: Age appropriate  Posture/gait:  Posture/Gait: Normal  Motor activity:     Sensorium  Attention:  Attention: Normal  Concentration:  Concentration: Normal  Orientation:  Orientation: Person, Place, Situation, Time  Recall/memory:  Recall/Memory: Normal  Affect and Mood  Affect:  Affect: Appropriate  Mood:  Mood: Depressed, Hopeless, Worthless  Relating  Eye contact:  Eye Contact: Normal  Facial expression:  Facial Expression: Depressed, Sad  Attitude toward examiner:  Attitude Toward Examiner: Cooperative  Thought and Language  Speech flow: Speech Flow: Clear and Coherent, Normal  Thought content:  Thought Content: Appropriate to Mood and Circumstances  Preoccupation:     Hallucinations:  Hallucinations: None  Organization:     Company secretary of Knowledge:  Fund of Knowledge: Average  Intelligence:  Intelligence: Average  Abstraction:  Abstraction: Normal  Judgement:  Judgement: Fair  Dance movement psychotherapist:     Insight:  Insight: Fair  Decision Making:     Social Functioning  Social Maturity:     Social Judgement:     Stress  Stressors:  Stressors: Grief/losses, Transitions, Surveyor, quantity, Housing  Coping Ability:  Coping Ability: Exhausted, Building surveyor  Deficits:  Skill Deficits: Self-control, Responsibility  Supports:       Exercise/Diet: Exercise/Diet Do You Have Any Trouble Sleeping?: Yes Explanation of Sleeping Difficulties: 2 hours, frequent awakenings   CCA Employment/Education  Employment/Work Situation: Employment / Work Situation Employment situation: Unemployed Has patient ever been in the [employer]?:  No  Education: Education Is Patient Currently Attending School?: No Last Grade Completed: 12 Did Garment/textile technologist From [employer]?: No Did You Product manager?: No Did Designer, television/film set?: No   CCA Family/Childhood History  Family and Relationship History: unable to assess    Childhood History:  Childhood History Did patient suffer any verbal/emotional/physical/sexual abuse as a child?: No Did patient suffer from severe childhood neglect?: No Has patient ever been sexually abused/assaulted/raped as an adolescent or adult?: No Was the patient ever a victim of a crime or a disaster?: No Witnessed domestic violence?: No Has patient been affected by domestic violence as an adult?: No    CCA Substance Use  Alcohol/Drug Use: Alcohol / Drug Use Pain Medications: see MAR Prescriptions: see MAR Over the Counter: see MAR History of alcohol / drug use?: Yes Substance #1 Name of Substance 1: alcohol 1 - Amount (size/oz): 5th - .5 gallon 1 - Frequency: daily 1 - Last Use / Amount: today   ASAM's:  Six Dimensions of Multidimensional Assessment  Dimension 1:  Acute Intoxication and/or Withdrawal Potential:      Dimension 2:  Biomedical Conditions and Complications:      Dimension 3:  Emotional, Behavioral, or Cognitive Conditions and Complications:     Dimension 4:  Readiness to Change:     Dimension 5:  Relapse, Continued use, or Continued Problem Potential:     Dimension 6:  Recovery/Living Environment:     ASAM Severity Score:    ASAM Recommended Level of Treatment:     Substance use Disorder (SUD)    Recommendations for Services/Supports/Treatments:    Burnetta Sabin

## 2020-06-13 NOTE — ED Notes (Signed)
Pt belongings placed in locker #5.  

## 2020-06-13 NOTE — ED Provider Notes (Signed)
[facility] EMERGENCY DEPARTMENT Provider Note   CSN: [id number] Arrival date & time: 06/13/20  1356     History Chief Complaint  Patient presents with  . Alcohol Problem  . Suicidal    Bryan Harper is a 29 y.o. male a past medical history of schizophrenia, bipolar disorder, alcohol abuse.  He has been drinking 1/5 of alcohol daily.  His last alcohol intake was last night around 8 PM.  He states that he has been having symptoms of shakiness, agitation, feeling like something crawling on his skin.  He occasionally uses cocaine but states it is very rare he cannot remember the last time he used it.  He has also been feeling suicidal and having suicidal thoughts but denies any active plan of suicide.  He has a history of previous suicide attempts and previous psychiatric hospitalizations.  He is here voluntarily.  He is not yet been vaccinated against coronavirus  HPI     History reviewed. No pertinent past medical history.  There are no problems to display for this patient.   History reviewed. No pertinent surgical history.     No family history on file.  Social History   Tobacco Use  . Smoking status: Not on file  Substance Use Topics  . Alcohol use: Yes  . Drug use: Yes    Types: Cocaine    Home Medications Prior to Admission medications   Not on File    Allergies    Patient has no known allergies.  Review of Systems   Review of Systems Ten systems reviewed and are negative for acute change, except as noted in the HPI.   Physical Exam Updated Vital Signs BP 121/84 (BP Location: Left Arm)   Pulse 93   Temp 98.2 F (36.8 C) (Oral)   Resp 16   SpO2 96%   Physical Exam Vitals and nursing note reviewed.  Constitutional:      General: He is not in acute distress.    Appearance: He is well-developed. He is not diaphoretic.  HENT:     Head: Normocephalic and atraumatic.  Eyes:     General: No scleral icterus.     Conjunctiva/sclera: Conjunctivae normal.  Cardiovascular:     Rate and Rhythm: Normal rate and regular rhythm.     Heart sounds: Normal heart sounds.  Pulmonary:     Effort: Pulmonary effort is normal. No respiratory distress.     Breath sounds: Normal breath sounds.  Abdominal:     Palpations: Abdomen is soft.     Tenderness: There is no abdominal tenderness.  Musculoskeletal:     Cervical back: Normal range of motion and neck supple.  Skin:    General: Skin is warm and dry.  Neurological:     Mental Status: He is alert.  Psychiatric:        Behavior: Behavior normal.     ED Results / Procedures / Treatments   Labs (all labs ordered are listed, but only abnormal results are displayed) Labs Reviewed  COMPREHENSIVE METABOLIC PANEL - Abnormal; Notable for the following components:      Result Value   Glucose, Bld 67 (*)    All other components within normal limits  ETHANOL - Abnormal; Notable for the following components:   Alcohol, Ethyl (B) 11 (*)    All other components within normal limits  SALICYLATE LEVEL - Abnormal; Notable for the following components:   Salicylate Lvl <7.0 (*)    All other components within  normal limits  ACETAMINOPHEN LEVEL - Abnormal; Notable for the following components:   Acetaminophen (Tylenol), Serum <10 (*)    All other components within normal limits  RAPID URINE DRUG SCREEN, HOSP PERFORMED - Abnormal; Notable for the following components:   Cocaine POSITIVE (*)    All other components within normal limits  CBG MONITORING, ED - Abnormal; Notable for the following components:   Glucose-Capillary 138 (*)    All other components within normal limits  RESPIRATORY PANEL BY RT PCR (FLU A&B, COVID)  CBC    EKG None  Radiology No results found.  Procedures Procedures (including critical care time)  Medications Ordered in ED Medications  LORazepam (ATIVAN) tablet 1 mg (1 mg Oral Given 06/13/20 1528)    ED Course  I have reviewed  the triage vital signs and the nursing notes.  Pertinent labs & imaging results that were available during my care of the patient were reviewed by me and considered in my medical decision making (see chart for details).    MDM Rules/Calculators/A&P                          Patient appears medically clear,  Glucose is slightly low. Will feed and recheck.   Patient repeat glucose 138 after being fed.  I have reviewed the patient's labs and he is medically clear for psychiatric evaluation. Final Clinical Impression(s) / ED Diagnoses Final diagnoses:  Suicidal thoughts    Rx / DC Orders ED Discharge Orders    None       Arthor Captain, PA-C 06/14/20 1538    Charlynne Pander, MD 06/14/20 [phone]

## 2020-06-14 NOTE — ED Provider Notes (Signed)
Patient cleared by psych.   Resources given.   Roxy Horseman, PA-C 06/14/20 Milton Ferguson, April, MD 06/14/20 [phone]

## 2020-06-15 ENCOUNTER — Other Ambulatory Visit: Payer: Self-pay

## 2020-06-15 ENCOUNTER — Encounter (HOSPITAL_COMMUNITY): Payer: Self-pay

## 2020-06-15 ENCOUNTER — Emergency Department (HOSPITAL_COMMUNITY)
Admission: EM | Admit: 2020-06-15 | Discharge: 2020-06-15 | Disposition: A | Discharge status: Home / Self Care | Payer: Self-pay | Attending: Emergency Medicine | Admitting: Emergency Medicine

## 2020-06-15 DIAGNOSIS — F19939 Other psychoactive substance use, unspecified with withdrawal, unspecified: Secondary | ICD-10-CM | POA: Insufficient documentation

## 2020-06-15 DIAGNOSIS — Z5321 Procedure and treatment not carried out due to patient leaving prior to being seen by health care provider: Secondary | ICD-10-CM | POA: Insufficient documentation

## 2020-06-15 HISTORY — DX: Bipolar disorder, unspecified: F31.9

## 2020-06-15 NOTE — ED Notes (Signed)
Patient wanded by security due to verbal aggression for hospital staff safety.

## 2020-06-15 NOTE — ED Notes (Signed)
Patient stated that he doesn't really want detox and would like to leave. Patient declines any SI or HI at this time. [employer] walked out with patient.

## 2020-06-15 NOTE — ED Notes (Signed)
PT refuse to change out. PT request arm brand to be cut off to leave. PT seen walking out front door

## 2020-06-15 NOTE — ED Triage Notes (Signed)
Patient arrived stating he wants to detox. Patient brought in a bottle of alcohol, meth, cocaine, and heroin. States last used all of it yesterday.

## 2020-06-16 ENCOUNTER — Emergency Department (HOSPITAL_COMMUNITY)
Admission: EM | Admit: 2020-06-16 | Discharge: 2020-06-17 | Disposition: A | Discharge status: Home / Self Care | Payer: Self-pay | Attending: Emergency Medicine | Admitting: Emergency Medicine

## 2020-06-16 ENCOUNTER — Encounter (HOSPITAL_COMMUNITY): Payer: Self-pay | Admitting: *Deleted

## 2020-06-16 DIAGNOSIS — F17200 Nicotine dependence, unspecified, uncomplicated: Secondary | ICD-10-CM | POA: Insufficient documentation

## 2020-06-16 DIAGNOSIS — F1010 Alcohol abuse, uncomplicated: Secondary | ICD-10-CM

## 2020-06-16 DIAGNOSIS — R45851 Suicidal ideations: Secondary | ICD-10-CM

## 2020-06-16 DIAGNOSIS — Z20822 Contact with and (suspected) exposure to covid-19: Secondary | ICD-10-CM | POA: Insufficient documentation

## 2020-06-16 DIAGNOSIS — Y908 Blood alcohol level of 240 mg/100 ml or more: Secondary | ICD-10-CM | POA: Insufficient documentation

## 2020-06-16 HISTORY — DX: Other psychoactive substance abuse, uncomplicated: F19.10

## 2020-06-16 HISTORY — DX: Alcohol abuse, uncomplicated: F10.10

## 2020-06-16 LAB — COMPREHENSIVE METABOLIC PANEL
ALT: 37 U/L (ref 0–44)
AST: 48 U/L — ABNORMAL HIGH (ref 15–41)
Albumin: 4.9 g/dL (ref 3.5–5.0)
Alkaline Phosphatase: 52 U/L (ref 38–126)
Anion gap: 16 — ABNORMAL HIGH (ref 5–15)
BUN: 13 mg/dL (ref 6–20)
CO2: 23 mmol/L (ref 22–32)
Calcium: 8.8 mg/dL — ABNORMAL LOW (ref 8.9–10.3)
Chloride: 104 mmol/L (ref 98–111)
Creatinine, Ser: 0.87 mg/dL (ref 0.61–1.24)
GFR, Estimated: >60 mL/min (ref >60)
Glucose, Bld: 81 mg/dL (ref 70–99)
Potassium: 4.2 mmol/L (ref 3.5–5.1)
Sodium: 143 mmol/L (ref 135–145)
Total Bilirubin: 1.3 mg/dL — ABNORMAL HIGH (ref 0.3–1.2)
Total Protein: 7.5 g/dL (ref 6.5–8.1)

## 2020-06-16 LAB — CBC
HCT: 44.6 % (ref 39.0–52.0)
Hemoglobin: 15.2 g/dL (ref 13.0–17.0)
MCH: 33.7 pg (ref 26.0–34.0)
MCHC: 34.1 g/dL (ref 30.0–36.0)
MCV: 98.9 fL (ref 80.0–100.0)
Platelets: 270 10*3/uL (ref 150–400)
RBC: 4.51 MIL/uL (ref 4.22–5.81)
RDW: 13.7 % (ref 11.5–15.5)
WBC: 7.3 10*3/uL (ref 4.0–10.5)
nRBC: 0 % (ref 0.0–0.2)

## 2020-06-16 LAB — RAPID URINE DRUG SCREEN, HOSP PERFORMED
Amphetamines: NOT DETECTED
Barbiturates: NOT DETECTED
Benzodiazepines: NOT DETECTED
Cocaine: POSITIVE — AB
Opiates: NOT DETECTED
Tetrahydrocannabinol: NOT DETECTED

## 2020-06-16 LAB — RESPIRATORY PANEL BY RT PCR (FLU A&B, COVID)
Influenza A by PCR: NEGATIVE
Influenza B by PCR: NEGATIVE
SARS Coronavirus 2 by RT PCR: NEGATIVE

## 2020-06-16 LAB — ETHANOL: Alcohol, Ethyl (B): 275 mg/dL — ABNORMAL HIGH (ref <10)

## 2020-06-16 LAB — SALICYLATE LEVEL: Salicylate Lvl: <7 mg/dL — ABNORMAL LOW (ref 7.0–30.0)

## 2020-06-16 LAB — ACETAMINOPHEN LEVEL: Acetaminophen (Tylenol), Serum: <10 ug/mL — ABNORMAL LOW (ref 10–30)

## 2020-06-16 MED ORDER — LORAZEPAM 1 MG PO TABS: 0.0000 mg | ORAL_TABLET | Freq: Two times a day (BID) | ORAL | Status: DC

## 2020-06-16 MED ORDER — LORAZEPAM 2 MG/ML IJ SOLN: 0.0000 mg | Freq: Two times a day (BID) | INTRAMUSCULAR | Status: DC

## 2020-06-16 MED ORDER — SODIUM CHLORIDE 0.9 % IV BOLUS
1000.0000 mL | Freq: Once | INTRAVENOUS | Status: AC
  Administered 2020-06-16: 1000 mL via INTRAVENOUS

## 2020-06-16 MED ORDER — LORAZEPAM 1 MG PO TABS
0.0000 mg | ORAL_TABLET | Freq: Four times a day (QID) | ORAL | Status: DC
  Administered 2020-06-16: 1 mg via ORAL
  Filled 2020-06-16: qty 1

## 2020-06-16 MED ORDER — THIAMINE HCL 100 MG/ML IJ SOLN: 100.0000 mg | Freq: Once | INTRAMUSCULAR | Status: DC

## 2020-06-16 MED ORDER — THIAMINE HCL 100 MG/ML IJ SOLN: 100.0000 mg | Freq: Every day | INTRAMUSCULAR | Status: DC

## 2020-06-16 MED ORDER — LORAZEPAM 2 MG/ML IJ SOLN: 0.0000 mg | Freq: Four times a day (QID) | INTRAMUSCULAR | Status: DC

## 2020-06-16 MED ORDER — THIAMINE HCL 100 MG PO TABS
100.0000 mg | ORAL_TABLET | Freq: Once | ORAL | Status: AC
  Administered 2020-06-16: 100 mg via ORAL
  Filled 2020-06-16: qty 1

## 2020-06-16 MED ORDER — THIAMINE HCL 100 MG PO TABS: 100.0000 mg | ORAL_TABLET | Freq: Every day | ORAL | Status: DC

## 2020-06-16 NOTE — ED Notes (Signed)
Pt sleeping. 

## 2020-06-16 NOTE — ED Notes (Signed)
Pt has been cooperative since on the unit.  No s/s of distress.

## 2020-06-16 NOTE — ED Triage Notes (Signed)
Per EMS- patient called EMS and reported that he wants detox from alcohol and crack. Patient states he has not had any alcohol in the past 2 hours nd crack in the past 5 hours. patient also reports that he has not had meds for his bipolar  In 3 months.

## 2020-06-16 NOTE — ED Triage Notes (Signed)
BIB EMS after someone from [place] called EMS, pt states he was trying to get to a detox place. Voices he has SI/HI. Wants to OD or hang self. States last thoughts of doing this was 2 hours ago.

## 2020-06-16 NOTE — BH Assessment (Addendum)
Comprehensive Clinical Assessment (CCA) Note  06/16/2020 Bryan Harper [id number]  Bryan Harper is a 29 year ol male who presents voluntary and unaccompanied to [facility]. Clinician asked the pt, "what brought you to the hospital?" Pt reported, he moved from [place] to [place] 2-3 months ago. Pt reported, he was going to [place] but decided to come get help. Pt reported, detox from alcohol been suicidal and homicidal for the past few days. Pt reported, he's suicidal with a plan of shooting himself or overdosing Fentanyl. Pt denies, access to guns. Pt reported, he's homicidal towards no one in particular with no plan. Pt denies, AVH, self-injurious behaviors and access to weapons.  Pt reported, drinking a 1/5 of Vodka, yesterday. Pt's 275 at 1705. Pt reported, using an unsure amount of cocaine yesterday. Pt's UDS is positive for cocaine. Pt denies, being linked to OPT resources (medication management and/or counseling.) Pt reported, previous [facility] in [place], [place], six months ago.   Pt presents quiet, awake in scrubs with clear and coherent speech. Pt's mood, affect was depressed. Pt's thought content was appropriate to mood and circumstances. Pt was oriented x5. Pt reported, he's not sure if he can contract for safety if discharged from [facility].   Disposition: Gillermo Murdoch, PMHNP recommends pt to be admitted to [facility]. Disposition discussed with Dr. Aileen Pilot and Marlene Bast, RN.   Diagnosis: Major Depressive Disorder, recurrent, severe without psychotic features.                      Alcohol use Disorder, severe.    *Pt reported, his mother knows where he is, pt declined for clinician to contact collaterals.*   Visit Diagnosis:      ICD-10-CM   1. Alcohol abuse  F10.10   2. Suicidal ideation  R45.851       CCA Screening, Triage and Referral (STR)  Patient Reported Information How did you hear about [place]? Self  Referral name: No  data recorded Referral phone number: No data recorded  Whom do you see for routine medical problems? I don't have a doctor  Practice/Facility Name: No data recorded Practice/Facility Phone Number: No data recorded Name of Contact: No data recorded Contact Number: No data recorded Contact Fax Number: No data recorded Prescriber Name: No data recorded Prescriber Address (if known): No data recorded  What Is the Reason for Your Visit/Call Today? SI and alcohol detox  How Long Has This Been Causing You Problems? 1 wk - 1 month  What Do You Feel Would Help You the Most Today? Medication;Therapy   Have You Recently Been in Any Inpatient Treatment (Hospital/Detox/Crisis Center/28-Day Program)? No  Name/Location of Program/Hospital:No data recorded How Long Were You There? No data recorded When Were You Discharged? No data recorded  Have You Ever Received Services From [facility] Before? No  Who Do You See at [facility]? No data recorded  Have You Recently Had Any Thoughts About Hurting Yourself? Yes  Are You Planning to Commit Suicide/Harm Yourself At This time? No   Have you Recently Had Thoughts About Hurting Someone [name]? No  Explanation: No data recorded  Have You Used Any Alcohol or Drugs in the Past 24 Hours? No  How Long Ago Did You Use Drugs or Alcohol? No data recorded What Did You Use and How Much? No data recorded  Do You Currently Have a Therapist/Psychiatrist? No  Name of Therapist/Psychiatrist: No data recorded  Have You Been Recently Discharged From Any Office  Practice or Programs? No  Explanation of Discharge From Practice/Program: No data recorded    CCA Screening Triage Referral Assessment Type of Contact: Tele-Assessment  Is this Initial or Reassessment? Initial Assessment  Date Telepsych consult ordered in [facility]:  06/13/20  Time Telepsych consult ordered in [facility]:  2005   Patient Reported Information Reviewed? Yes  Patient Left Without Being  Seen? No data recorded Reason for Not Completing Assessment: No data recorded  Collateral Involvement: none reported   Does Patient Have a Court Appointed Legal Guardian? No data recorded Name and Contact of Legal Guardian: No data recorded If Minor and Not Living with Parent(s), Who has Custody? No data recorded Is CPS involved or ever been involved? Never  Is APS involved or ever been involved? Never   Patient Determined To Be At Risk for Harm To Self or Others Based on Review of Patient Reported Information or Presenting Complaint? Yes, for Self-Harm  Method: No data recorded Availability of Means: No data recorded Intent: No data recorded Notification Required: No data recorded Additional Information for Danger to Others Potential: No data recorded Additional Comments for Danger to Others Potential: No data recorded Are There Guns or Other Weapons in Your Home? No data recorded Types of Guns/Weapons: No data recorded Are These Weapons Safely Secured?                            No data recorded Who Could Verify You Are Able To Have These Secured: No data recorded Do You Have any Outstanding Charges, Pending Court Dates, Parole/Probation? No data recorded Contacted To Inform of Risk of Harm To Self or Others: No data recorded  Location of Assessment: [facility] ED   Does Patient Present under Involuntary Commitment? No  IVC Papers Initial File Date: No data recorded  [place] of Residence: [place]   Patient Currently Receiving the Following Services: Not Receiving Services   Determination of Need: Emergent (2 hours)   Options For Referral: Chemical Dependency Intensive Outpatient Therapy (CDIOP);Inpatient Hospitalization;Medication Management    CCA Biopsychosocial  Intake/Chief Complaint:  CCA Intake With Chief Complaint CCA Part Two Date: 06/16/20 CCA Part Two Time: 2202 Chief Complaint/Presenting Problem: Per EDP/PA note: "29 y.o. male past Moser polysubstance abuse,  alcohol abuse, bipolar brought in by EMS for evaluation of alcohol and cocaine detox as well as SI.  Patient reports that he has been in [place] for the last several weeks.  He states that he has been doing about 1/5 of vodka a day.  His last drink was earlier today.  He also endorses using cocaine about 3 hours ago. He states that occasionally, he is snorted some fentanyl. Denies any other drug use. States that he was planning to go to [place] to make more money and buy drugs. He was concerned and felt like he needed detox so he called EMS. He does report some suicidal ideations. He states he thought about overdosing or running in front of a car or shooting himself. He states he is also homicidal but has no specific target. He denies any auditory or visual hallucinations." Patient's Currently Reported Symptoms/Problems: Suicidal thoughts with a plan, homicidal, substance use, detox. Individual's Strengths: Pt reported, "I have no idea." Type of Services Patient Feels Are Needed: Pt reported, to get back in rehab.  Mental Health Symptoms Depression:  Depression: Fatigue, Change in energy/activity, Increase/decrease in appetite, Sleep (too much or little), Hopelessness, Tearfulness, Worthlessness, Duration of symptoms greater than  two weeks  Mania:  Mania: None  Anxiety:   Anxiety: Fatigue, Restlessness, Sleep, Worrying, Tension  Psychosis:  Psychosis: None  Trauma:  Trauma: N/A  Obsessions:  Obsessions: None  Compulsions:  Compulsions: None  Inattention:  Inattention: None  Hyperactivity/Impulsivity:  Hyperactivity/Impulsivity: N/A  Oppositional/Defiant Behaviors:  Oppositional/Defiant Behaviors: None  Emotional Irregularity:  Emotional Irregularity: Recurrent suicidal behaviors/gestures/threats  Other Mood/Personality Symptoms:  Other Mood/Personality Symptoms: Pt was assessed on 06/13/2020 with a similar presentation.   Mental Status Exam Appearance and self-care  Stature:  Stature:  Average  Weight:  Weight: Average weight  Clothing:  Clothing: Age-appropriate  Grooming:  Grooming: Normal  Cosmetic use:  Cosmetic Use: Age appropriate  Posture/gait:  Posture/Gait: Normal  Motor activity:  Motor Activity: Not Remarkable  Sensorium  Attention:  Attention: Normal  Concentration:  Concentration: Normal  Orientation:  Orientation: X5  Recall/memory:  Recall/Memory: Normal  Affect and Mood  Affect:  Affect: Depressed  Mood:  Mood: Depressed  Relating  Eye contact:  Eye Contact: Normal  Facial expression:  Facial Expression: Depressed, Sad  Attitude toward examiner:  Attitude Toward Examiner: Cooperative  Thought and Language  Speech flow: Speech Flow: Clear and Coherent, Normal  Thought content:  Thought Content: Appropriate to Mood and Circumstances  Preoccupation:  Preoccupations: None  Hallucinations:  Hallucinations: None  Organization:     Company secretary of Knowledge:  Fund of Knowledge: Average  Intelligence:  Intelligence: Average  Abstraction:  Abstraction: Normal  Judgement:  Judgement: Fair  Dance movement psychotherapist:     Insight:  Insight: Fair  Decision Making:  Decision Making: Impulsive  Social Functioning  Social Maturity:     Social Judgement:     Stress  Stressors:  Stressors: Grief/losses, Transitions, Surveyor, quantity, Housing  Coping Ability:  Coping Ability: Exhausted, Building surveyor Deficits:  Skill Deficits: Self-control, Responsibility  Supports:        Religion:    Leisure/Recreation:    Exercise/Diet: Exercise/Diet Do You Have Any Trouble Sleeping?: Yes Explanation of Sleeping Difficulties: 2 hours, frequent awakenings   CCA Employment/Education  Employment/Work Situation: Employment / Work Psychologist, occupational Employment situation: Unemployed Has patient ever been in the [employer]?: No  Education: Education Is Patient Currently Attending School?: No Last Grade Completed: 10 Did Garment/textile technologist From [employer]?: No Did You  Product manager?: No Did Designer, television/film set?: No   CCA Family/Childhood History  Family and Relationship History: Family history Marital status: Single What is your sexual orientation?: NA Has your sexual activity been affected by drugs, alcohol, medication, or emotional stress?: NA Does patient have children?:  (Pt reported, his child died in 2013-01-19.)  Childhood History:  Childhood History Additional childhood history information: NA Description of patient's relationship with caregiver when they were a child: NA Patient's description of current relationship with people who raised him/her: NA How were you disciplined when you got in trouble as a child/adolescent?: NA Does patient have siblings?: Yes Number of Siblings: 1 Did patient suffer any verbal/emotional/physical/sexual abuse as a child?: No Did patient suffer from severe childhood neglect?: No Has patient ever been sexually abused/assaulted/raped as an adolescent or adult?: No Was the patient ever a victim of a crime or a disaster?: No Witnessed domestic violence?: No Has patient been affected by domestic violence as an adult?:  (NA)  Child/Adolescent Assessment:     CCA Substance Use  Alcohol/Drug Use: Alcohol / Drug Use Pain Medications: see MAR Prescriptions: see MAR Over the Counter: see MAR History of alcohol /  drug use?: Yes Substance #1 Name of Substance 1: Alcohol. 1 - Age of First Use: UTA 1 - Amount (size/oz): Pt reported, drinking a 1/5 of Vodka, yesterday. Pt''s 275 at 1705. 1 - Frequency: Daily. 1 - Duration: Ongoing. 1 - Last Use / Amount: Yesterday. Substance #2 Name of Substance 2: Cocaine. 2 - Age of First Use: UTA 2 - Amount (size/oz): Unknown. 2 - Frequency: Ongoing. 2 - Duration: Pt reported, "unsure." 2 - Last Use / Amount: Yesterday.     ASAM's:  Six Dimensions of Multidimensional Assessment  Dimension 1:  Acute Intoxication and/or Withdrawal Potential:      Dimension 2:   Biomedical Conditions and Complications:      Dimension 3:  Emotional, Behavioral, or Cognitive Conditions and Complications:     Dimension 4:  Readiness to Change:     Dimension 5:  Relapse, Continued use, or Continued Problem Potential:     Dimension 6:  Recovery/Living Environment:     ASAM Severity Score:    ASAM Recommended Level of Treatment:     Substance use Disorder (SUD)    Recommendations for Services/Supports/Treatments: Recommendations for Services/Supports/Treatments Recommendations For Services/Supports/Treatments: Other (Comment) ([facility] Continuing Observation Unit.)  DSM5 Diagnoses: There are no problems to display for this patient.    Referrals to Alternative Service(s): Referred to Alternative Service(s):   Place:   Date:   Time:    Referred to Alternative Service(s):   Place:   Date:   Time:    Referred to Alternative Service(s):   Place:   Date:   Time:    Referred to Alternative Service(s):   Place:   Date:   Time:     Redmond Pulling  Comprehensive Clinical Assessment (CCA) Screening, Triage and Referral Note  06/16/2020 Cristina Mattern [id number]  Visit Diagnosis:    ICD-10-CM   1. Alcohol abuse  F10.10   2. Suicidal ideation  R45.851     Patient Reported Information How did you hear about [place]? Self   Referral name: No data recorded  Referral phone number: No data recorded Whom do you see for routine medical problems? I don't have a doctor   Practice/Facility Name: No data recorded  Practice/Facility Phone Number: No data recorded  Name of Contact: No data recorded  Contact Number: No data recorded  Contact Fax Number: No data recorded  Prescriber Name: No data recorded  Prescriber Address (if known): No data recorded What Is the Reason for Your Visit/Call Today? SI and alcohol detox  How Long Has This Been Causing You Problems? 1 wk - 1 month  Have You Recently Been in Any Inpatient Treatment (Hospital/Detox/Crisis Center/28-Day  Program)? No   Name/Location of Program/Hospital:No data recorded  How Long Were You There? No data recorded  When Were You Discharged? No data recorded Have You Ever Received Services From [facility] Before? No   Who Do You See at [facility]? No data recorded Have You Recently Had Any Thoughts About Hurting Yourself? Yes   Are You Planning to Commit Suicide/Harm Yourself At This time?  No  Have you Recently Had Thoughts About Hurting Someone [name]? No   Explanation: No data recorded Have You Used Any Alcohol or Drugs in the Past 24 Hours? No   How Long Ago Did You Use Drugs or Alcohol?  No data recorded  What Did You Use and How Much? No data recorded What Do You Feel Would Help You the Most Today? Medication;Therapy  Do You Currently Have  a Therapist/Psychiatrist? No   Name of Therapist/Psychiatrist: No data recorded  Have You Been Recently Discharged From Any Office Practice or Programs? No   Explanation of Discharge From Practice/Program:  No data recorded    CCA Screening Triage Referral Assessment Type of Contact: Tele-Assessment   Is this Initial or Reassessment? Initial Assessment   Date Telepsych consult ordered in [facility]:  06/13/20   [facility]  Patient Reported Information Reviewed? Yes   Patient Left Without Being Seen? No data recorded  Reason for Not Completing Assessment: No data recorded Collateral Involvement: none reported  Does Patient Have a Court Appointed Legal Guardian? No data recorded  Name and Contact of Legal Guardian:  No data recorded If Minor and Not Living with Parent(s), Who has Custody? No data recorded Is CPS involved or ever been involved? Never  Is APS involved or ever been involved? Never  Patient Determined To Be At Risk for Harm To Self or Others Based on Review of Patient Reported Information or Presenting Complaint? Yes, for Self-Harm   Method: No data recorded  Availability of Means: No  data recorded  Intent: No data recorded  Notification Required: No data recorded  Additional Information for Danger to Others Potential:  No data recorded  Additional Comments for Danger to Others Potential:  No data recorded  Are There Guns or Other Weapons in Your Home?  No data recorded   Types of Guns/Weapons: No data recorded   Are These Weapons Safely Secured?                              No data recorded   Who Could Verify You Are Able To Have These Secured:    No data recorded Do You Have any Outstanding Charges, Pending Court Dates, Parole/Probation? No data recorded Contacted To Inform of Risk of Harm To Self or Others: No data recorded [facility]  Does Patient Present under Involuntary Commitment? No   IVC Papers Initial File Date: No data recorded  [place]  Patient Currently Receiving the Following Services: Not Receiving Services   Determination of Need: Emergent (2 hours)   Options For Referral: Chemical Dependency Intensive Outpatient Therapy (CDIOP);Inpatient Hospitalization;Medication Management   Redmond Pullingreylese D [place], [facility]     Redmond Pullingreylese D Mitchael Luckey, MS, [facility], [facility] Triage Specialist [phone]

## 2020-06-16 NOTE — ED Notes (Signed)
Patient belongs contains shirt, pants, shoes, wallet and a sweater. Belongings are in locker 30 in [place]. Patient changed into scrubs.

## 2020-06-16 NOTE — ED Notes (Signed)
Attempted report call to [facility], nurse stated patient needed an update COVID swab.

## 2020-06-16 NOTE — ED Provider Notes (Signed)
[facility]-EMERGENCY DEPT Provider Note   CSN: [id number] Arrival date & time: 06/16/20  1552     History Chief Complaint  Patient presents with  . Alcohol Intoxication  . Cocaine Abuse    Bryan Harper is a 29 y.o. male past Moser polysubstance abuse, alcohol abuse, bipolar brought in by EMS for evaluation of alcohol and cocaine detox as well as SI.  Patient reports that he has been in [place] for the last several weeks.  He states that he has been doing about 1/5 of vodka a day.  His last drink was earlier today.  He also endorses using cocaine about 3 hours ago.  He states that occasionally, he is snorted some fentanyl.  Denies any other drug use.  States that he was planning to go to [place] to make more money and buy drugs.  He was concerned and felt like he needed detox so he called EMS.  He does report some suicidal ideations.  He he states he thought about overdosing or running in front of a car or shooting himself.  He states he is also homicidal but has no specific target.  He denies any auditory or visual hallucinations.  EM LEVEL 5 CAVEAT DUE TO PSYCh  The history is provided by the patient.       Past Medical History:  Diagnosis Date  . Bipolar 1 disorder (HCC)   . ETOH abuse   . Polysubstance abuse (HCC)     There are no problems to display for this patient.   No past surgical history on file.     No family history on file.  Social History   Tobacco Use  . Smoking status: Current Every Day Smoker  . Smokeless tobacco: Never Used  Substance Use Topics  . Alcohol use: Yes    Comment: 2 hours ago  . Drug use: Yes    Types: Cocaine    Comment: 2 hours ago    Home Medications Prior to Admission medications   Not on File    Allergies    Patient has no known allergies.  Review of Systems   Review of Systems  Unable to perform ROS: Psychiatric disorder    Physical Exam Updated Vital Signs BP 133/75 (BP Location:  Right Arm)   Pulse 85   Temp 98.6 F (37 C) (Oral)   Resp 18   Wt 63.5 kg   SpO2 93%   Physical Exam Vitals and nursing note reviewed.  Constitutional:      Appearance: Normal appearance. He is well-developed.  HENT:     Head: Normocephalic and atraumatic.  Eyes:     General: Lids are normal.     Conjunctiva/sclera: Conjunctivae normal.     Pupils: Pupils are equal, round, and reactive to light.  Cardiovascular:     Rate and Rhythm: Normal rate and regular rhythm.     Pulses: Normal pulses.     Heart sounds: Normal heart sounds. No murmur heard.  No friction rub. No gallop.   Pulmonary:     Effort: Pulmonary effort is normal.     Breath sounds: Normal breath sounds.     Comments: Lungs clear to auscultation bilaterally.  Symmetric chest rise.  No wheezing, rales, rhonchi. Abdominal:     Palpations: Abdomen is soft. Abdomen is not rigid.     Tenderness: There is no abdominal tenderness. There is no guarding.     Comments: Abdomen is soft, non-distended, non-tender. No rigidity, No guarding. No  peritoneal signs.  Musculoskeletal:        General: Normal range of motion.     Cervical back: Full passive range of motion without pain.  Skin:    General: Skin is warm and dry.     Capillary Refill: Capillary refill takes less than 2 seconds.  Neurological:     Mental Status: He is alert and oriented to person, place, and time.  Psychiatric:        Speech: Speech normal.     ED Results / Procedures / Treatments   Labs (all labs ordered are listed, but only abnormal results are displayed) Labs Reviewed  COMPREHENSIVE METABOLIC PANEL - Abnormal; Notable for the following components:      Result Value   Calcium 8.8 (*)    AST 48 (*)    Total Bilirubin 1.3 (*)    Anion gap 16 (*)    All other components within normal limits  ETHANOL - Abnormal; Notable for the following components:   Alcohol, Ethyl (B) 275 (*)    All other components within normal limits  SALICYLATE LEVEL  - Abnormal; Notable for the following components:   Salicylate Lvl <7.0 (*)    All other components within normal limits  ACETAMINOPHEN LEVEL - Abnormal; Notable for the following components:   Acetaminophen (Tylenol), Serum <10 (*)    All other components within normal limits  RAPID URINE DRUG SCREEN, HOSP PERFORMED - Abnormal; Notable for the following components:   Cocaine POSITIVE (*)    All other components within normal limits  RESPIRATORY PANEL BY RT PCR (FLU A&B, COVID)  CBC    EKG None  Radiology No results found.  Procedures Procedures (including critical care time)  Medications Ordered in ED Medications  sodium chloride 0.9 % bolus 1,000 mL (0 mLs Intravenous Stopped 06/16/20 1942)  thiamine tablet 100 mg (100 mg Oral Given 06/16/20 1942)    ED Course  I have reviewed the triage vital signs and the nursing notes.  Pertinent labs & imaging results that were available during my care of the patient were reviewed by me and considered in my medical decision making (see chart for details).    MDM Rules/Calculators/A&P                          29 year old male who presents for evaluation of alcohol intoxication, SI.  States that he also abuses cocaine.  He states that he has been having suicidal thoughts and is thought about overdosing or running in front of a car.  He states he has tried to kill himself before when he was 16.  On initially arrival, he is afebrile, slightly tachycardic but no tremors.  Does not appear to be in acute withdrawal.  He does report that he last drank alcohol a few hours prior to ED arrival.  He typically drinks 1/5 of liquor a day.  He does endorse suicidal ideations.  We will plan for medical clearance labs, TTS consultation.  Acetaminophen level, salicylate level unremarkable.  Alcohol is 275's.  CMP is unremarkable.  CBC shows no leukocytosis or anemia.  Patient has been accepted to behavioral health urgent care.  He will be transferred  over.  At this time, patient exhibits no signs of acute alcohol withdrawal.  We will plan to transfer to behavioral health urgent care.  Portions of this note were generated with Scientist, clinical (histocompatibility and immunogenetics). Dictation errors may occur despite best attempts at proofreading.  Final Clinical Impression(s) / ED Diagnoses Final diagnoses:  Alcohol abuse  Suicidal ideation    Rx / DC Orders ED Discharge Orders    None       Rosana Hoes 06/17/20 Morrie Sheldon, MD 06/27/20 1538

## 2020-06-17 ENCOUNTER — Encounter (HOSPITAL_COMMUNITY): Payer: Self-pay | Admitting: Emergency Medicine

## 2020-06-17 ENCOUNTER — Other Ambulatory Visit: Payer: Self-pay

## 2020-06-17 ENCOUNTER — Ambulatory Visit (HOSPITAL_COMMUNITY)
Admission: EM | Admit: 2020-06-17 | Discharge: 2020-06-17 | Disposition: A | Discharge status: Home / Self Care | Payer: No Payment, Other | Attending: Behavioral Health | Admitting: Behavioral Health

## 2020-06-17 DIAGNOSIS — R45851 Suicidal ideations: Secondary | ICD-10-CM | POA: Insufficient documentation

## 2020-06-17 DIAGNOSIS — F17210 Nicotine dependence, cigarettes, uncomplicated: Secondary | ICD-10-CM | POA: Insufficient documentation

## 2020-06-17 DIAGNOSIS — F1029 Alcohol dependence with unspecified alcohol-induced disorder: Secondary | ICD-10-CM

## 2020-06-17 DIAGNOSIS — Z79899 Other long term (current) drug therapy: Secondary | ICD-10-CM | POA: Insufficient documentation

## 2020-06-17 DIAGNOSIS — F419 Anxiety disorder, unspecified: Secondary | ICD-10-CM | POA: Insufficient documentation

## 2020-06-17 DIAGNOSIS — R450 Nervousness: Secondary | ICD-10-CM | POA: Insufficient documentation

## 2020-06-17 DIAGNOSIS — F329 Major depressive disorder, single episode, unspecified: Secondary | ICD-10-CM | POA: Insufficient documentation

## 2020-06-17 DIAGNOSIS — F1010 Alcohol abuse, uncomplicated: Secondary | ICD-10-CM | POA: Insufficient documentation

## 2020-06-17 DIAGNOSIS — F1490 Cocaine use, unspecified, uncomplicated: Secondary | ICD-10-CM | POA: Insufficient documentation

## 2020-06-17 DIAGNOSIS — F1994 Other psychoactive substance use, unspecified with psychoactive substance-induced mood disorder: Secondary | ICD-10-CM

## 2020-06-17 MED ORDER — MAGNESIUM HYDROXIDE 400 MG/5ML PO SUSP: 30.0000 mL | Freq: Every day | ORAL | Status: DC | PRN

## 2020-06-17 MED ORDER — THIAMINE HCL 100 MG PO TABS: 100.0000 mg | ORAL_TABLET | Freq: Every day | ORAL | Status: DC

## 2020-06-17 MED ORDER — ACETAMINOPHEN 325 MG PO TABS: 650.0000 mg | ORAL_TABLET | Freq: Four times a day (QID) | ORAL | Status: DC | PRN

## 2020-06-17 MED ORDER — LOPERAMIDE HCL 2 MG PO CAPS: 2.0000 mg | ORAL_CAPSULE | ORAL | Status: DC | PRN

## 2020-06-17 MED ORDER — HYDROXYZINE HCL 25 MG PO TABS: 25.0000 mg | ORAL_TABLET | Freq: Four times a day (QID) | ORAL | Status: DC | PRN

## 2020-06-17 MED ORDER — LORAZEPAM 1 MG PO TABS: 1.0000 mg | ORAL_TABLET | Freq: Two times a day (BID) | ORAL | Status: DC

## 2020-06-17 MED ORDER — ADULT MULTIVITAMIN W/MINERALS CH
1.0000 | ORAL_TABLET | Freq: Every day | ORAL | Status: DC
  Administered 2020-06-17: 1 via ORAL
  Filled 2020-06-17: qty 1

## 2020-06-17 MED ORDER — LORAZEPAM 1 MG PO TABS
1.0000 mg | ORAL_TABLET | Freq: Four times a day (QID) | ORAL | Status: DC | PRN
  Administered 2020-06-17: 1 mg via ORAL
  Filled 2020-06-17: qty 1

## 2020-06-17 MED ORDER — THIAMINE HCL 100 MG/ML IJ SOLN
100.0000 mg | Freq: Once | INTRAMUSCULAR | Status: AC
  Administered 2020-06-17: 100 mg via INTRAMUSCULAR
  Filled 2020-06-17: qty 2

## 2020-06-17 MED ORDER — LORAZEPAM 1 MG PO TABS: 1.0000 mg | ORAL_TABLET | Freq: Every day | ORAL | Status: DC

## 2020-06-17 MED ORDER — LORAZEPAM 1 MG PO TABS: 1.0000 mg | ORAL_TABLET | Freq: Three times a day (TID) | ORAL | Status: DC

## 2020-06-17 MED ORDER — TRAZODONE HCL 100 MG PO TABS
100.0000 mg | ORAL_TABLET | Freq: Every evening | ORAL | Status: DC | PRN
  Administered 2020-06-17: 100 mg via ORAL
  Filled 2020-06-17: qty 1

## 2020-06-17 MED ORDER — LORAZEPAM 1 MG PO TABS
1.0000 mg | ORAL_TABLET | Freq: Four times a day (QID) | ORAL | Status: DC
  Administered 2020-06-17: 1 mg via ORAL
  Filled 2020-06-17: qty 1

## 2020-06-17 MED ORDER — ONDANSETRON 4 MG PO TBDP: 4.0000 mg | ORAL_TABLET | Freq: Four times a day (QID) | ORAL | Status: DC | PRN

## 2020-06-17 MED ORDER — ALUM & MAG HYDROXIDE-SIMETH 200-200-20 MG/5ML PO SUSP: 30.0000 mL | ORAL | Status: DC | PRN

## 2020-06-17 NOTE — ED Notes (Signed)
Report called to [facility]  

## 2020-06-17 NOTE — ED Provider Notes (Signed)
Behavioral Health Admission H&P [facility] & OBS)  Date: 06/17/20 Patient Name: Bryan Harper MRN: [id number] Chief Complaint:  Chief Complaint  Patient presents with  . Suicidal  . Homicidal      Diagnoses: Bipolar 1 disorder, ETOH abuse and   Polysubstance abuse   HPI: Khaled Herda is a 29 y.o. male who presents voluntarily and unaccompanied to [facility] by way of [facility] due to polysubstance abuse, alcohol abuse, and a diagnosis of bipolar disorder. EMS brought him into [facility] for evaluation of alcohol and cocaine detox and SI. The patient reported he had been in [place] for 2-3 months. He disclosed he moved from [place]. The patient denies suicidal ideation but voiced that he is afraid to detox without any medical assistance. He revealed he tends to experience awful withdrawal symptoms. He expressed before coming to the hospital; he was experiencing suicidal ideation. The patient admitted to drinking a 1/5 of Vodka yesterday. Which his BAL is /dl at 4098. He also disclosed using cocaine yesterday, which he was not able to tell how much he used. The patient's UDS is positive for cocaine. The patient voiced not receiving any treatment on an outpatient basis (medication management or counseling.) The patient does not appear to be responding to internal or external stimuli. Neither is the patient presenting with any delusional thinking. The patient denies auditory or visual hallucinations. The patient is not presenting with any psychotic or paranoid behaviors. During an encounter with the patient, he was able to answer questions appropriately. The patient is dressed in scrubs; he is alert and oriented x 4. The patient speaks in a clear tone, at moderate volume and average pace. Eye contact is good. The patient's mood is depressed, and his affect is congruent with his mood. The thought process is coherent and relevant. The patient was cooperative throughout the assessment.    PHQ 2-9:      ED from 06/16/2020 in [facility] ED from 06/13/2020 in [facility] EMERGENCY DEPARTMENT  C-SSRS RISK CATEGORY High Risk Low Risk       Total Time spent with patient: 30 minutes  Musculoskeletal  Strength & Muscle Tone: within normal limits Gait & Station: normal Patient leans: N/A  Psychiatric Specialty Exam  Presentation General Appearance: Appropriate for Environment  Eye Contact:Good  Speech:Clear and Coherent  Speech Volume:Normal  Handedness:Right   Mood and Affect  Mood:Depressed;Anxious  Affect:Appropriate;Congruent;Depressed;Flat   Thought Process  Thought Processes:Coherent  Descriptions of Associations:Intact  Orientation:Full (Time, Place and Person)  Thought Content:Logical;WDL  Hallucinations:Hallucinations: None  Ideas of Reference:None  Suicidal Thoughts:Suicidal Thoughts: No  Homicidal Thoughts:Homicidal Thoughts: No   Sensorium  Memory:Immediate Good;Recent Good;Remote Good  Judgment:Poor  Insight:Lacking   Executive Functions  Concentration:Good  Attention Span:Good  Recall:Good  Fund of Knowledge:Good  Language:Good   Psychomotor Activity  Psychomotor Activity:Psychomotor Activity: Normal   Assets  Assets:Communication Skills;Desire for Improvement;Housing;Social Support   Sleep  Sleep:Sleep: Fair   Physical Exam Vitals and nursing note reviewed.  Constitutional:      Appearance: Normal appearance. He is normal weight.  HENT:     Right Ear: Tympanic membrane normal.     Left Ear: Tympanic membrane normal.     Nose: Nose normal.  Cardiovascular:     Rate and Rhythm: Normal rate.  Pulmonary:     Effort: Pulmonary effort is normal.  Musculoskeletal:        General: Normal range of motion.     Cervical back: Normal range of motion  and neck supple.  Neurological:     General: No focal deficit present.     Mental Status: He is alert and oriented to person,  place, and time.  Psychiatric:        Attention and Perception: Attention and perception normal.        Mood and Affect: Mood is anxious and depressed. Affect is blunt.        Speech: Speech normal.        Behavior: Behavior normal. Behavior is cooperative.        Thought Content: Thought content normal.        Cognition and Memory: Cognition and memory normal.        Judgment: Judgment normal.    Review of Systems  Psychiatric/Behavioral: Positive for depression. The patient is nervous/anxious.   All other systems reviewed and are negative.   Blood pressure (!) 132/95, pulse 81, temperature 98.1 F (36.7 C), resp. rate 18, SpO2 97 %. There is no height or weight on file to calculate BMI.  Past Psychiatric History:    Is the patient at risk to self? No  Has the patient been a risk to self in the past 6 months? No .    Has the patient been a risk to self within the distant past? Yes   Is the patient a risk to others? No   Has the patient been a risk to others in the past 6 months? No   Has the patient been a risk to others within the distant past? No   Past Medical History:  Past Medical History:  Diagnosis Date  . Bipolar 1 disorder (HCC)   . ETOH abuse   . Polysubstance abuse (HCC)    History reviewed. No pertinent surgical history.  Family History: History reviewed. No pertinent family history.  Social History:  Social History   Socioeconomic History  . Marital status: Single    Spouse name: Not on file  . Number of children: Not on file  . Years of education: Not on file  . Highest education level: Not on file  Occupational History  . Not on file  Tobacco Use  . Smoking status: Current Every Day Smoker  . Smokeless tobacco: Never Used  Substance and Sexual Activity  . Alcohol use: Yes    Comment: 2 hours ago  . Drug use: Yes    Types: Cocaine    Comment: 2 hours ago  . Sexual activity: Not on file  Other Topics Concern  . Not on file  Social History  Narrative  . Not on file   Social Determinants of Health   Financial Resource Strain:   . Difficulty of Paying Living Expenses: Not on file  Food Insecurity:   . Worried About Programme researcher, broadcasting/film/video in the Last Year: Not on file  . Ran Out of Food in the Last Year: Not on file  Transportation Needs:   . Lack of Transportation (Medical): Not on file  . Lack of Transportation (Non-Medical): Not on file  Physical Activity:   . Days of Exercise per Week: Not on file  . Minutes of Exercise per Session: Not on file  Stress:   . Feeling of Stress : Not on file  Social Connections:   . Frequency of Communication with Friends and Family: Not on file  . Frequency of Social Gatherings with Friends and Family: Not on file  . Attends Religious Services: Not on file  . Active Member of Clubs  or Organizations: Not on file  . Attends BankerClub or Organization Meetings: Not on file  . Marital Status: Not on file  Intimate Partner Violence:   . Fear of Current or Ex-Partner: Not on file  . Emotionally Abused: Not on file  . Physically Abused: Not on file  . Sexually Abused: Not on file    SDOH:  SDOH Screenings   Alcohol Screen:   . Last Alcohol Screening Score (AUDIT): Not on file  Depression (PHQ2-9):   . PHQ-2 Score: Not on file  Financial Resource Strain:   . Difficulty of Paying Living Expenses: Not on file  Food Insecurity:   . Worried About Programme researcher, broadcasting/film/videounning Out of Food in the Last Year: Not on file  . Ran Out of Food in the Last Year: Not on file  Housing:   . Last Housing Risk Score: Not on file  Physical Activity:   . Days of Exercise per Week: Not on file  . Minutes of Exercise per Session: Not on file  Social Connections:   . Frequency of Communication with Friends and Family: Not on file  . Frequency of Social Gatherings with Friends and Family: Not on file  . Attends Religious Services: Not on file  . Active Member of Clubs or Organizations: Not on file  . Attends BankerClub or Organization  Meetings: Not on file  . Marital Status: Not on file  Stress:   . Feeling of Stress : Not on file  Tobacco Use: High Risk  . Smoking Tobacco Use: Current Every Day Smoker  . Smokeless Tobacco Use: Never Used  Transportation Needs:   . Freight forwarderLack of Transportation (Medical): Not on file  . Lack of Transportation (Non-Medical): Not on file    Last Labs:  Admission on 06/16/2020, Discharged on 06/17/2020  Component Date Value Ref Range Status  . Sodium 06/16/2020 143  135 - 145 mmol/L Final  . Potassium 06/16/2020 4.2  3.5 - 5.1 mmol/L Final  . Chloride 06/16/2020 104  98 - 111 mmol/L Final  . CO2 06/16/2020 23  22 - 32 mmol/L Final  . Glucose, Bld 06/16/2020 81  70 - 99 mg/dL Final   Glucose reference range applies only to samples taken after fasting for at least 8 hours.  . BUN 06/16/2020 13  6 - 20 mg/dL Final  . Creatinine, Ser 06/16/2020 0.87  0.61 - 1.24 mg/dL Final  . Calcium 40/98/119110/22/2021 8.8* 8.9 - 10.3 mg/dL Final  . Total Protein 06/16/2020 7.5  6.5 - 8.1 g/dL Final  . Albumin 47/82/956210/22/2021 4.9  3.5 - 5.0 g/dL Final  . AST 13/08/657810/22/2021 48* 15 - 41 U/L Final  . ALT 06/16/2020 37  0 - 44 U/L Final  . Alkaline Phosphatase 06/16/2020 52  38 - 126 U/L Final  . Total Bilirubin 06/16/2020 1.3* 0.3 - 1.2 mg/dL Final  . GFR, Estimated 06/16/2020 >60  >60 mL/min Final   Comment: (NOTE) Calculated using the CKD-EPI Creatinine Equation (2021)   . Anion gap 06/16/2020 16* 5 - 15 Final   Performed at [facility], [address] [place], [place] [address]  . Alcohol, Ethyl (B) 06/16/2020 275* <10 mg/dL Final   Comment: (NOTE) Lowest detectable limit for serum alcohol is 10 mg/dL.  For medical purposes only. Performed at [facility], [address] [place], [place] [address]   . Salicylate Lvl 06/16/2020 <7.0* 7.0 - 30.0 mg/dL Final   Performed at [facility], [address] [place],  [place] [address]  . Acetaminophen (Tylenol),  Serum 06/16/2020 <10* 10 - 30 ug/mL Final   Comment: (NOTE) Therapeutic concentrations vary significantly. A range of 10-30 ug/mL  may be an effective concentration for many patients. However, some  are best treated at concentrations outside of this range. Acetaminophen concentrations >150 ug/mL at 4 hours after ingestion  and >50 ug/mL at 12 hours after ingestion are often associated with  toxic reactions.  Performed at [facility], [address], [place], [place] [address]   . WBC 06/16/2020 7.3  4.0 - 10.5 K/uL Final  . RBC 06/16/2020 4.51  4.22 - 5.81 MIL/uL Final  . Hemoglobin 06/16/2020 15.2  13.0 - 17.0 g/dL Final  . HCT 24/26/8341 44.6  39 - 52 % Final  . MCV 06/16/2020 98.9  80.0 - 100.0 fL Final  . MCH 06/16/2020 33.7  26.0 - 34.0 pg Final  . MCHC 06/16/2020 34.1  30.0 - 36.0 g/dL Final  . RDW 96/22/2979 13.7  11.5 - 15.5 % Final  . Platelets 06/16/2020 270  150 - 400 K/uL Final  . nRBC 06/16/2020 0.0  0.0 - 0.2 % Final   Performed at [facility], [address] [place], [place] [address]  . Opiates 06/16/2020 NONE DETECTED  NONE DETECTED Final  . Cocaine 06/16/2020 POSITIVE* NONE DETECTED Final  . Benzodiazepines 06/16/2020 NONE DETECTED  NONE DETECTED Final  . Amphetamines 06/16/2020 NONE DETECTED  NONE DETECTED Final  . Tetrahydrocannabinol 06/16/2020 NONE DETECTED  NONE DETECTED Final  . Barbiturates 06/16/2020 NONE DETECTED  NONE DETECTED Final   Comment: (NOTE) DRUG SCREEN FOR MEDICAL PURPOSES ONLY.  IF CONFIRMATION IS NEEDED FOR ANY PURPOSE, NOTIFY LAB WITHIN 5 DAYS.  LOWEST DETECTABLE LIMITS FOR URINE DRUG SCREEN Drug Class                     Cutoff (ng/mL) Amphetamine and metabolites    1000 Barbiturate and metabolites    200 Benzodiazepine                 200 Tricyclics and metabolites     300 Opiates and metabolites        300 Cocaine and metabolites        300 THC                            50 Performed at  [facility], [address] [place], [place] [address]   . SARS Coronavirus 2 by RT PCR 06/16/2020 NEGATIVE  NEGATIVE Final   Comment: (NOTE) SARS-CoV-2 target nucleic acids are NOT DETECTED.  The SARS-CoV-2 RNA is generally detectable in upper respiratoy specimens during the acute phase of infection. The lowest concentration of SARS-CoV-2 viral copies this assay can detect is 131 copies/mL. A negative result does not preclude SARS-Cov-2 infection and should not be used as the sole basis for treatment or other patient management decisions. A negative result may occur with  improper specimen collection/handling, submission of specimen other than nasopharyngeal swab, presence of viral mutation(s) within the areas targeted by this assay, and inadequate number of viral copies (<131 copies/mL). A negative result must be combined with clinical observations, patient history, and epidemiological information. The expected result is Negative.  Fact Sheet for Patients:  https://www.moore.com/  Fact Sheet for Healthcare Providers:  https://www.young.biz/  This test is no  t yet approved or cleared by the [place] and  has been authorized for detection and/or diagnosis of SARS-CoV-2 by FDA under an Emergency Use Authorization (EUA). This EUA will remain  in effect (meaning this test can be used) for the duration of the COVID-19 declaration under Section 564(b)(1) of the Act, 21 U.S.C. section 360bbb-3(b)(1), unless the authorization is terminated or revoked sooner.    . Influenza A by PCR 06/16/2020 NEGATIVE  NEGATIVE Final  . Influenza B by PCR 06/16/2020 NEGATIVE  NEGATIVE Final   Comment: (NOTE) The Xpert Xpress SARS-CoV-2/FLU/RSV assay is intended as an aid in  the diagnosis of influenza from Nasopharyngeal swab specimens and  should not be used as a sole basis for treatment. Nasal washings and   aspirates are unacceptable for Xpert Xpress SARS-CoV-2/FLU/RSV  testing.  Fact Sheet for Patients: https://www.moore.com/  Fact Sheet for Healthcare Providers: https://www.young.biz/  This test is not yet approved or cleared by the [place] FDA and  has been authorized for detection and/or diagnosis of SARS-CoV-2 by  FDA under an Emergency Use Authorization (EUA). This EUA will remain  in effect (meaning this test can be used) for the duration of the  Covid-19 declaration under Section 564(b)(1) of the Act, 21  U.S.C. section 360bbb-3(b)(1), unless the authorization is  terminated or revoked. Performed at [facility], [address], [place], [place] [address]   Admission on 06/13/2020, Discharged on 06/14/2020  Component Date Value Ref Range Status  . Sodium 06/13/2020 141  135 - 145 mmol/L Final  . Potassium 06/13/2020 4.1  3.5 - 5.1 mmol/L Final  . Chloride 06/13/2020 102  98 - 111 mmol/L Final  . CO2 06/13/2020 27  22 - 32 mmol/L Final  . Glucose, Bld 06/13/2020 67* 70 - 99 mg/dL Final   Glucose reference range applies only to samples taken after fasting for at least 8 hours.  . BUN 06/13/2020 12  6 - 20 mg/dL Final  . Creatinine, Ser 06/13/2020 0.92  0.61 - 1.24 mg/dL Final  . Calcium 29/56/2130 9.3  8.9 - 10.3 mg/dL Final  . Total Protein 06/13/2020 7.4  6.5 - 8.1 g/dL Final  . Albumin 86/57/8469 4.7  3.5 - 5.0 g/dL Final  . AST 62/95/2841 32  15 - 41 U/L Final  . ALT 06/13/2020 34  0 - 44 U/L Final  . Alkaline Phosphatase 06/13/2020 47  38 - 126 U/L Final  . Total Bilirubin 06/13/2020 0.6  0.3 - 1.2 mg/dL Final  . GFR, Estimated 06/13/2020 >60  >60 mL/min Final  . Anion gap 06/13/2020 12  5 - 15 Final   Performed at [facility], [address] [place], [place] [address]  . Alcohol, Ethyl (B) 06/13/2020 11* <10 mg/dL Final   Comment: (NOTE) Lowest detectable limit for serum alcohol is 10  mg/dL.  For medical purposes only. Performed at [facility], [address], [place], [place] [address]   . Salicylate Lvl 06/13/2020 <7.0* 7.0 - 30.0 mg/dL Final   Performed at [facility], [address] [place], [place] [address]  . Acetaminophen (Tylenol), Serum 06/13/2020 <10* 10 - 30 ug/mL Final   Comment: (NOTE) Therapeutic concentrations vary significantly. A range of 10-30 ug/mL  may be an effective concentration for many patients. However, some  are best treated at concentrations outside of this range. Acetaminophen concentrations >150 ug/mL at 4 hours after ingestion  and >50 ug/mL at 12 hours after ingestion  are often associated with  toxic reactions.  Performed at [facility], [address] [place], [place] [address]   . WBC 06/13/2020 9.7  4.0 - 10.5 K/uL Final  . RBC 06/13/2020 4.72  4.22 - 5.81 MIL/uL Final  . Hemoglobin 06/13/2020 15.4  13.0 - 17.0 g/dL Final  . HCT 94/85/4627 46.5  39 - 52 % Final  . MCV 06/13/2020 98.5  80.0 - 100.0 fL Final  . MCH 06/13/2020 32.6  26.0 - 34.0 pg Final  . MCHC 06/13/2020 33.1  30.0 - 36.0 g/dL Final  . RDW 03/50/0938 13.7  11.5 - 15.5 % Final  . Platelets 06/13/2020 334  150 - 400 K/uL Final  . nRBC 06/13/2020 0.0  0.0 - 0.2 % Final   Performed at [facility], [address], [place], [place] [address]  . Opiates 06/13/2020 NONE DETECTED  NONE DETECTED Final  . Cocaine 06/13/2020 POSITIVE* NONE DETECTED Final  . Benzodiazepines 06/13/2020 NONE DETECTED  NONE DETECTED Final  . Amphetamines 06/13/2020 NONE DETECTED  NONE DETECTED Final  . Tetrahydrocannabinol 06/13/2020 NONE DETECTED  NONE DETECTED Final  . Barbiturates 06/13/2020 NONE DETECTED  NONE DETECTED Final   Comment: (NOTE) DRUG SCREEN FOR MEDICAL PURPOSES ONLY.  IF CONFIRMATION IS NEEDED FOR ANY PURPOSE, NOTIFY LAB WITHIN 5 DAYS.  LOWEST DETECTABLE LIMITS FOR URINE DRUG SCREEN Drug Class                     Cutoff  (ng/mL) Amphetamine and metabolites    1000 Barbiturate and metabolites    200 Benzodiazepine                 200 Tricyclics and metabolites     300 Opiates and metabolites        300 Cocaine and metabolites        300 THC                            50 Performed at [facility], [address], [place], [place] [address]   . SARS Coronavirus 2 by RT PCR 06/13/2020 NEGATIVE  NEGATIVE Final   Comment: (NOTE) SARS-CoV-2 target nucleic acids are NOT DETECTED.  The SARS-CoV-2 RNA is generally detectable in upper respiratoy specimens during the acute phase of infection. The lowest concentration of SARS-CoV-2 viral copies this assay can detect is 131 copies/mL. A negative result does not preclude SARS-Cov-2 infection and should not be used as the sole basis for treatment or other patient management decisions. A negative result may occur with  improper specimen collection/handling, submission of specimen other than nasopharyngeal swab, presence of viral mutation(s) within the areas targeted by this assay, and inadequate number of viral copies (<131 copies/mL). A negative result must be combined with clinical observations, patient history, and epidemiological information. The expected result is Negative.  Fact Sheet for Patients:  https://www.moore.com/  Fact Sheet for Healthcare Providers:  https://www.young.biz/  This test is no                          t yet approved or cleared by the [place] FDA and  has been authorized for detection and/or diagnosis of SARS-CoV-2 by FDA under an Emergency Use Authorization (EUA). This EUA will remain  in effect (meaning this test can be used) for the duration of the COVID-19 declaration under Section 564(b)(1) of the Act,  21 U.S.C. section 360bbb-3(b)(1), unless the authorization is terminated or revoked sooner.    . Influenza A by PCR 06/13/2020 NEGATIVE  NEGATIVE Final  . Influenza B by PCR  06/13/2020 NEGATIVE  NEGATIVE Final   Comment: (NOTE) The Xpert Xpress SARS-CoV-2/FLU/RSV assay is intended as an aid in  the diagnosis of influenza from Nasopharyngeal swab specimens and  should not be used as a sole basis for treatment. Nasal washings and  aspirates are unacceptable for Xpert Xpress SARS-CoV-2/FLU/RSV  testing.  Fact Sheet for Patients: https://www.moore.com/  Fact Sheet for Healthcare Providers: https://www.young.biz/  This test is not yet approved or cleared by the [place] FDA and  has been authorized for detection and/or diagnosis of SARS-CoV-2 by  FDA under an Emergency Use Authorization (EUA). This EUA will remain  in effect (meaning this test can be used) for the duration of the  Covid-19 declaration under Section 564(b)(1) of the Act, 21  U.S.C. section 360bbb-3(b)(1), unless the authorization is  terminated or revoked. Performed at [facility], [address] [place], [place] [address]   . Glucose-Capillary 06/13/2020 138* 70 - 99 mg/dL Final   Glucose reference range applies only to samples taken after fasting for at least 8 hours.  . Comment 1 06/13/2020 Document in Chart   Final    Allergies: Patient has no known allergies.  PTA Medications: (Not in a hospital admission)   Medical Decision Making   Recommendations  Based on my evaluation the patient does not appear to have an emergency medical condition.  Gillermo Murdoch, NP 06/17/20  2:09 AM

## 2020-06-17 NOTE — Discharge Instructions (Addendum)

## 2020-06-17 NOTE — ED Triage Notes (Signed)
Pt presents with suicidal and homicidal ideations and alcohol detox.

## 2020-06-17 NOTE — ED Notes (Signed)
Nurse Discharge Note:  D:Patient denies SI/HI/AVH at this time. Pt appears calm and cooperative, and no distress noted.  A: All Personal items in locker returned to pt. Pt given AVS. Patient escorted off unit by staff to meet safe transport driver who is taking him to [employer] [facility], [facility]).  R:  Pt States he will comply with outpatient services, and take medications as prescribed.

## 2020-06-17 NOTE — ED Notes (Signed)
Items in locker [address] Headphones

## 2020-06-17 NOTE — ED Notes (Signed)
Pt presents with suicidal and homicidal ideations increasing in severity over the past few days.  Pt requesting alcohol detox also.  Plan to shoot self or overdose on Fentanyl.  Skin search completed, monitoring for safety.  Pt calm & cooperative, no distress noted.

## 2020-06-17 NOTE — ED Notes (Signed)
Pt sleeping at present, no distress noted, monitoring for safety. 

## 2020-06-17 NOTE — ED Notes (Signed)
Safe transport 

## 2020-06-17 NOTE — ED Provider Notes (Signed)
FBC/OBS ASAP Discharge Summary  Date and Time: 06/17/2020 8:55 AM  Name: Bryan Harper  MRN:  [id number]   Discharge Diagnoses:  Final diagnoses:  None    Subjective: Patient reports this morning that he came in so that he can get some detox from alcohol.  Patient reports he is wanting to go to a detox facility and then to a residential facility.  Patient denies having any suicidal or homicidal ideations and denies any hallucinations.  Patient is informed that [facility] [place] for [facility].  Patient is informed that he will be transported there they will do their assessment there to determine his placement.  Patient states understanding and agreement.  Patient does request that we transport him there and patient was informed that we provide him transportation directly to the facility.  Stay Summary: Patient is a 29 year old male presented voluntarily to the with about ED reporting suicidal ideations and depression and alcohol dependence requesting detox.  Patient was medically cleared in the ED and patient's BAL was 275 and reported drinking 1/5 of vodka.  Patient was transferred to the [facility] for continuous assessment.  Today the patient reported he is still requesting detox and then potentially going to a residential treatment facility.  Patient denies any suicidal or homicidal ideations and denies any hallucinations.  Patient was provided with transportation via safe transport [facility] [place] for detox bed.  Patient was not taking any scheduled medications so no prescriptions were provided.  Total Time spent with patient: 30 minutes  Past Psychiatric History: Alcohol dependence, bipolar 1 disorder, polysubstance abuse Past Medical History:  Past Medical History:  Diagnosis Date  . Bipolar 1 disorder (HCC)   . ETOH abuse   . Polysubstance abuse (HCC)    History reviewed. No pertinent surgical history. Family History: History reviewed. No pertinent  family history. Family Psychiatric History: None reported Social History:  Social History   Substance and Sexual Activity  Alcohol Use Yes   Comment: 2 hours ago     Social History   Substance and Sexual Activity  Drug Use Yes  . Types: Cocaine   Comment: 2 hours ago    Social History   Socioeconomic History  . Marital status: Single    Spouse name: Not on file  . Number of children: Not on file  . Years of education: Not on file  . Highest education level: Not on file  Occupational History  . Not on file  Tobacco Use  . Smoking status: Current Every Day Smoker  . Smokeless tobacco: Never Used  Substance and Sexual Activity  . Alcohol use: Yes    Comment: 2 hours ago  . Drug use: Yes    Types: Cocaine    Comment: 2 hours ago  . Sexual activity: Not on file  Other Topics Concern  . Not on file  Social History Narrative  . Not on file   Social Determinants of Health   Financial Resource Strain:   . Difficulty of Paying Living Expenses: Not on file  Food Insecurity:   . Worried About Programme researcher, broadcasting/film/video in the Last Year: Not on file  . Ran Out of Food in the Last Year: Not on file  Transportation Needs:   . Lack of Transportation (Medical): Not on file  . Lack of Transportation (Non-Medical): Not on file  Physical Activity:   . Days of Exercise per Week: Not on file  . Minutes of  Exercise per Session: Not on file  Stress:   . Feeling of Stress : Not on file  Social Connections:   . Frequency of Communication with Friends and Family: Not on file  . Frequency of Social Gatherings with Friends and Family: Not on file  . Attends Religious Services: Not on file  . Active Member of Clubs or Organizations: Not on file  . Attends Banker Meetings: Not on file  . Marital Status: Not on file   SDOH:  SDOH Screenings   Alcohol Screen:   . Last Alcohol Screening Score (AUDIT): Not on file  Depression (PHQ2-9):   . PHQ-2 Score: Not on file   Financial Resource Strain:   . Difficulty of Paying Living Expenses: Not on file  Food Insecurity:   . Worried About Programme researcher, broadcasting/film/video in the Last Year: Not on file  . Ran Out of Food in the Last Year: Not on file  Housing:   . Last Housing Risk Score: Not on file  Physical Activity:   . Days of Exercise per Week: Not on file  . Minutes of Exercise per Session: Not on file  Social Connections:   . Frequency of Communication with Friends and Family: Not on file  . Frequency of Social Gatherings with Friends and Family: Not on file  . Attends Religious Services: Not on file  . Active Member of Clubs or Organizations: Not on file  . Attends Banker Meetings: Not on file  . Marital Status: Not on file  Stress:   . Feeling of Stress : Not on file  Tobacco Use: High Risk  . Smoking Tobacco Use: Current Every Day Smoker  . Smokeless Tobacco Use: Never Used  Transportation Needs:   . Freight forwarder (Medical): Not on file  . Lack of Transportation (Non-Medical): Not on file    Has this patient used any form of tobacco in the last 30 days? (Cigarettes, Smokeless Tobacco, Cigars, and/or Pipes) A prescription for an FDA-approved tobacco cessation medication was offered at discharge and the patient refused  Current Medications:  Current Facility-Administered Medications  Medication Dose Route Frequency Provider Last Rate Last Admin  . acetaminophen (TYLENOL) tablet 650 mg  650 mg Oral Q6H PRN Gillermo Murdoch, NP      . alum & mag hydroxide-simeth (MAALOX/MYLANTA) 200-200-20 MG/5ML suspension 30 mL  30 mL Oral Q4H PRN Gillermo Murdoch, NP      . hydrOXYzine (ATARAX/VISTARIL) tablet 25 mg  25 mg Oral Q6H PRN Gillermo Murdoch, NP      . loperamide (IMODIUM) capsule 2-4 mg  2-4 mg Oral PRN Gillermo Murdoch, NP      . LORazepam (ATIVAN) tablet 1 mg  1 mg Oral Q6H PRN Gillermo Murdoch, NP   1 mg at 06/17/20 0145  . LORazepam (ATIVAN) tablet 1 mg  1 mg  Oral QID Gillermo Murdoch, NP       Followed by  . [START ON 06/18/2020] LORazepam (ATIVAN) tablet 1 mg  1 mg Oral TID Gillermo Murdoch, NP       Followed by  . [START ON 06/19/2020] LORazepam (ATIVAN) tablet 1 mg  1 mg Oral BID Gillermo Murdoch, NP       Followed by  . [START ON 06/20/2020] LORazepam (ATIVAN) tablet 1 mg  1 mg Oral Daily Gillermo Murdoch, NP      . magnesium hydroxide (MILK OF MAGNESIA) suspension 30 mL  30 mL Oral Daily PRN Gillermo Murdoch, NP      .  multivitamin with minerals tablet 1 tablet  1 tablet Oral Daily Gillermo Murdoch, NP      . ondansetron (ZOFRAN-ODT) disintegrating tablet 4 mg  4 mg Oral Q6H PRN Gillermo Murdoch, NP      . Melene Muller ON 06/18/2020] thiamine tablet 100 mg  100 mg Oral Daily Gillermo Murdoch, NP      . traZODone (DESYREL) tablet 100 mg  100 mg Oral QHS PRN Gillermo Murdoch, NP   100 mg at 06/17/20 0145   No current outpatient medications on file.    PTA Medications: (Not in a hospital admission)   Musculoskeletal  Strength & Muscle Tone: within normal limits Gait & Station: normal Patient leans: N/A  Psychiatric Specialty Exam  Presentation  General Appearance: Appropriate for Environment;Casual  Eye Contact:Good  Speech:Clear and Coherent;Normal Rate  Speech Volume:Normal  Handedness:Right   Mood and Affect  Mood:Anxious  Affect:Appropriate;Congruent   Thought Process  Thought Processes:Coherent  Descriptions of Associations:Intact  Orientation:Full (Time, Place and Person)  Thought Content:WDL  Hallucinations:Hallucinations: None  Ideas of Reference:None  Suicidal Thoughts:Suicidal Thoughts: No  Homicidal Thoughts:Homicidal Thoughts: No   Sensorium  Memory:Immediate Good;Recent Good;Remote Good  Judgment:Fair  Insight:Fair   Executive Functions  Concentration:Good  Attention Span:Good  Recall:Good  Fund of Knowledge:Good  Language:Good   Psychomotor  Activity  Psychomotor Activity:Psychomotor Activity: Normal   Assets  Assets:Communication Skills;Desire for Improvement;Physical Health   Sleep  Sleep:Sleep: Good   Physical Exam  Physical Exam Vitals and nursing note reviewed.  Constitutional:      Appearance: He is well-developed.  HENT:     Head: Normocephalic.  Eyes:     Pupils: Pupils are equal, round, and reactive to light.  Cardiovascular:     Rate and Rhythm: Normal rate.  Pulmonary:     Effort: Pulmonary effort is normal.  Musculoskeletal:        General: Normal range of motion.  Neurological:     Mental Status: He is alert and oriented to person, place, and time.    Review of Systems  Constitutional: Negative.   HENT: Negative.   Eyes: Negative.   Respiratory: Negative.   Cardiovascular: Negative.   Gastrointestinal: Negative.   Genitourinary: Negative.   Musculoskeletal: Negative.   Skin: Negative.   Neurological: Negative.   Endo/Heme/Allergies: Negative.   Psychiatric/Behavioral: Positive for depression and substance abuse.   Blood pressure (!) 137/97, pulse 78, temperature 98.6 F (37 C), temperature source Oral, resp. rate 18, SpO2 96 %. There is no height or weight on file to calculate BMI.  Demographic Factors:  Male and Caucasian  Loss Factors: NA  Historical Factors: NA  Risk Reduction Factors:   Positive social support and Positive therapeutic relationship  Continued Clinical Symptoms:  Previous Psychiatric Diagnoses and Treatments  Cognitive Features That Contribute To Risk:  None    Suicide Risk:  Mild:  Suicidal ideation of limited frequency, intensity, duration, and specificity.  There are no identifiable plans, no associated intent, mild dysphoria and related symptoms, good self-control (both objective and subjective assessment), few other risk factors, and identifiable protective factors, including available and accessible social support.  Plan Of Care/Follow-up  recommendations:  Continue activity as tolerated. Continue diet as recommended by your PCP. Ensure to keep all appointments with outpatient providers.  Disposition: Discharge to [facility] in [place] for detox  Maryfrances Bunnell, FNP 06/17/2020, 8:55 AM

## 2020-06-17 NOTE — ED Notes (Signed)
Patient states he is here for detox. Patient denies SI/HI. Patient given support and encouragement. Monitoring continues.

## 2020-06-17 NOTE — ED Notes (Signed)
Patient discharged.

## 2020-06-21 ENCOUNTER — Other Ambulatory Visit: Payer: Self-pay

## 2020-06-21 ENCOUNTER — Ambulatory Visit (HOSPITAL_COMMUNITY)
Admission: EM | Admit: 2020-06-21 | Discharge: 2020-06-22 | Disposition: A | Discharge status: Home / Self Care | Payer: No Payment, Other | Attending: Registered Nurse | Admitting: Registered Nurse

## 2020-06-21 ENCOUNTER — Encounter (HOSPITAL_COMMUNITY): Payer: Self-pay | Admitting: Registered Nurse

## 2020-06-21 DIAGNOSIS — F1910 Other psychoactive substance abuse, uncomplicated: Secondary | ICD-10-CM

## 2020-06-21 DIAGNOSIS — Z5902 Unsheltered homelessness: Secondary | ICD-10-CM | POA: Insufficient documentation

## 2020-06-21 DIAGNOSIS — Z91128 Patient's intentional underdosing of medication regimen for other reason: Secondary | ICD-10-CM | POA: Diagnosis not present

## 2020-06-21 DIAGNOSIS — R45851 Suicidal ideations: Secondary | ICD-10-CM | POA: Insufficient documentation

## 2020-06-21 DIAGNOSIS — F1994 Other psychoactive substance use, unspecified with psychoactive substance-induced mood disorder: Secondary | ICD-10-CM | POA: Diagnosis not present

## 2020-06-21 DIAGNOSIS — F17200 Nicotine dependence, unspecified, uncomplicated: Secondary | ICD-10-CM | POA: Insufficient documentation

## 2020-06-21 DIAGNOSIS — Z20822 Contact with and (suspected) exposure to covid-19: Secondary | ICD-10-CM | POA: Insufficient documentation

## 2020-06-21 LAB — COMPREHENSIVE METABOLIC PANEL
ALT: 48 U/L — ABNORMAL HIGH (ref 0–44)
AST: 55 U/L — ABNORMAL HIGH (ref 15–41)
Albumin: 4.5 g/dL (ref 3.5–5.0)
Alkaline Phosphatase: 58 U/L (ref 38–126)
Anion gap: 15 (ref 5–15)
BUN: 12 mg/dL (ref 6–20)
CO2: 24 mmol/L (ref 22–32)
Calcium: 9.6 mg/dL (ref 8.9–10.3)
Chloride: 98 mmol/L (ref 98–111)
Creatinine, Ser: 0.85 mg/dL (ref 0.61–1.24)
GFR, Estimated: >60 mL/min (ref >60)
Glucose, Bld: 70 mg/dL (ref 70–99)
Potassium: 3.9 mmol/L (ref 3.5–5.1)
Sodium: 137 mmol/L (ref 135–145)
Total Bilirubin: 1.5 mg/dL — ABNORMAL HIGH (ref 0.3–1.2)
Total Protein: 6.9 g/dL (ref 6.5–8.1)

## 2020-06-21 LAB — POCT URINALYSIS DIP (DEVICE)
Bilirubin Urine: NEGATIVE
Glucose, UA: NEGATIVE mg/dL
Ketones, ur: NEGATIVE mg/dL
Leukocytes,Ua: NEGATIVE
Nitrite: NEGATIVE
Protein, ur: NEGATIVE mg/dL
Specific Gravity, Urine: 1.02 (ref 1.005–1.030)
Urobilinogen, UA: 0.2 mg/dL (ref 0.0–1.0)
pH: 6 (ref 5.0–8.0)

## 2020-06-21 LAB — CBC WITH DIFFERENTIAL/PLATELET
Abs Immature Granulocytes: 0.03 10*3/uL (ref 0.00–0.07)
Basophils Absolute: 0.1 10*3/uL (ref 0.0–0.1)
Basophils Relative: 1 %
Eosinophils Absolute: 0.1 10*3/uL (ref 0.0–0.5)
Eosinophils Relative: 1 %
HCT: 43.9 % (ref 39.0–52.0)
Hemoglobin: 15.3 g/dL (ref 13.0–17.0)
Immature Granulocytes: 0 %
Lymphocytes Relative: 20 %
Lymphs Abs: 1.8 10*3/uL (ref 0.7–4.0)
MCH: 33.1 pg (ref 26.0–34.0)
MCHC: 34.9 g/dL (ref 30.0–36.0)
MCV: 95 fL (ref 80.0–100.0)
Monocytes Absolute: 0.7 10*3/uL (ref 0.1–1.0)
Monocytes Relative: 7 %
Neutro Abs: 6.4 10*3/uL (ref 1.7–7.7)
Neutrophils Relative %: 71 %
Platelets: 260 10*3/uL (ref 150–400)
RBC: 4.62 MIL/uL (ref 4.22–5.81)
RDW: 12.9 % (ref 11.5–15.5)
WBC: 9.1 10*3/uL (ref 4.0–10.5)
nRBC: 0 % (ref 0.0–0.2)

## 2020-06-21 LAB — POCT URINE DRUG SCREEN - MANUAL ENTRY (I-SCREEN)
POC Amphetamine UR: NOT DETECTED
POC Buprenorphine (BUP): NOT DETECTED
POC Cocaine UR: NOT DETECTED
POC Marijuana UR: NOT DETECTED
POC Methadone UR: NOT DETECTED
POC Methamphetamine UR: NOT DETECTED
POC Morphine: NOT DETECTED
POC Oxazepam (BZO): POSITIVE — AB
POC Oxycodone UR: NOT DETECTED
POC Secobarbital (BAR): NOT DETECTED

## 2020-06-21 LAB — TSH: TSH: 0.943 u[IU]/mL (ref 0.350–4.500)

## 2020-06-21 LAB — POC SARS CORONAVIRUS 2 AG -  ED: SARS Coronavirus 2 Ag: NEGATIVE

## 2020-06-21 LAB — RESPIRATORY PANEL BY RT PCR (FLU A&B, COVID)
Influenza A by PCR: NEGATIVE
Influenza B by PCR: NEGATIVE
SARS Coronavirus 2 by RT PCR: NEGATIVE

## 2020-06-21 LAB — POC SARS CORONAVIRUS 2 AG: SARS Coronavirus 2 Ag: NEGATIVE

## 2020-06-21 LAB — ETHANOL: Alcohol, Ethyl (B): 27 mg/dL — ABNORMAL HIGH (ref <10)

## 2020-06-21 MED ORDER — OLANZAPINE 5 MG PO TABS
5.0000 mg | ORAL_TABLET | Freq: Once | ORAL | Status: AC
  Administered 2020-06-21: 5 mg via ORAL
  Filled 2020-06-21: qty 1

## 2020-06-21 MED ORDER — SERTRALINE HCL 25 MG PO TABS
25.0000 mg | ORAL_TABLET | Freq: Once | ORAL | Status: AC
  Administered 2020-06-21: 25 mg via ORAL
  Filled 2020-06-21: qty 1

## 2020-06-21 MED ORDER — HYDROXYZINE HCL 25 MG PO TABS: 25.0000 mg | ORAL_TABLET | Freq: Three times a day (TID) | ORAL | Status: DC | PRN

## 2020-06-21 MED ORDER — ACETAMINOPHEN 325 MG PO TABS: 650.0000 mg | ORAL_TABLET | Freq: Four times a day (QID) | ORAL | Status: DC | PRN

## 2020-06-21 NOTE — ED Notes (Signed)
Patient arrived to observation unit, Patient cooperative, Patient clothing being washed. Patient taking shower. Belongings in locker 25

## 2020-06-21 NOTE — BH Assessment (Signed)
Comprehensive Clinical Assessment (CCA) Note  06/21/2020 Bryan Harper [id number]   Patient is a 29 year old male presenting voluntarily to [facility] for assessment reporting suicidal ideation with a plan to jump off a bridge or jump in front of a train. This is patient's 3rd ED visit in past week reporting SI and requesting assistance with substance use. Patient reports he went [facility] 1 week ago but was only there for a day. Then he went to [facility] for treatment but left 1 week ago. He states he has been on the streets since then. Patient reports drinking 1 24 oz beer per day. He also endorses a history of cocaine abuse but has not used in 1 week. Patient endorses current SI. He denies HI. He reports Ah of people talking to him. He endorses VH of "people in white masks." He states that he constantly feels paranoid and like people are "after me." Patient reports he would like long term treatment for substance use and mental health. He reports in the past he has been prescribed Zyprexa and Zoloft but is currently not taking anything. Patient state he does not have any supports locally.  This counselor contacted Greggory Stallion at [facility] at [phone], who states they do not accept referrals this late in the day. He suggests [facility] contact them tomorrow after 8:15 AM for referrals.   Shuvon Rankin, NP recommends patient be admitted to continuous assessment for observation and stabilization. Patient to be reassessed by psych on 10/27.  Visit Diagnosis:   MDD, recurrent, severe, with psychosis    Polysubstance abuse    ICD-10-CM   1. Substance induced mood disorder (HCC)  F19.94   2. Polysubstance abuse (HCC)  F19.10   3. Suicidal ideation  R45.851       CCA Screening, Triage and Referral (STR)  Patient Reported Information How did you hear about [place]? Self  Referral name: No data recorded Referral phone number: No data recorded  Whom do you see for routine medical problems? I  don't have a doctor  Practice/Facility Name: No data recorded Practice/Facility Phone Number: No data recorded Name of Contact: No data recorded Contact Number: No data recorded Contact Fax Number: No data recorded Prescriber Name: No data recorded Prescriber Address (if known): No data recorded  What Is the Reason for Your Visit/Call Today? SI and alcohol detox  How Long Has This Been Causing You Problems? > than 6 months  What Do You Feel Would Help You the Most Today? Therapy;Medication   Have You Recently Been in Any Inpatient Treatment (Hospital/Detox/Crisis Center/28-Day Program)? No  Name/Location of Program/Hospital:No data recorded How Long Were You There? No data recorded When Were You Discharged? No data recorded  Have You Ever Received Services From [facility] Before? No  Who Do You See at [facility]? No data recorded  Have You Recently Had Any Thoughts About Hurting Yourself? Yes  Are You Planning to Commit Suicide/Harm Yourself At This time? No   Have you Recently Had Thoughts About Hurting Someone [name]? No  Explanation: No data recorded  Have You Used Any Alcohol or Drugs in the Past 24 Hours? No  How Long Ago Did You Use Drugs or Alcohol? No data recorded What Did You Use and How Much? No data recorded  Do You Currently Have a Therapist/Psychiatrist? No  Name of Therapist/Psychiatrist: No data recorded  Have You Been Recently Discharged From Any Office Practice or Programs? No  Explanation of Discharge From Practice/Program: No data  recorded    CCA Screening Triage Referral Assessment Type of Contact: Face-to-Face  Is this Initial or Reassessment? Initial Assessment  Date Telepsych consult ordered in [facility]:  06/13/20  Time Telepsych consult ordered in [facility]:  2005   Patient Reported Information Reviewed? Yes  Patient Left Without Being Seen? No data recorded Reason for Not Completing Assessment: No data recorded  Collateral Involvement:  none reported   Does Patient Have a Court Appointed Legal Guardian? No data recorded Name and Contact of Legal Guardian: No data recorded If Minor and Not Living with Parent(s), Who has Custody? No data recorded Is CPS involved or ever been involved? Never  Is APS involved or ever been involved? Never   Patient Determined To Be At Risk for Harm To Self or Others Based on Review of Patient Reported Information or Presenting Complaint? No  Method: No data recorded Availability of Means: No data recorded Intent: No data recorded Notification Required: No data recorded Additional Information for Danger to Others Potential: No data recorded Additional Comments for Danger to Others Potential: No data recorded Are There Guns or Other Weapons in Your Home? No data recorded Types of Guns/Weapons: No data recorded Are These Weapons Safely Secured?                            No data recorded Who Could Verify You Are Able To Have These Secured: No data recorded Do You Have any Outstanding Charges, Pending Court Dates, Parole/Probation? No data recorded Contacted To Inform of Risk of Harm To Self or Others: No data recorded  Location of Assessment: [facility]   Does Patient Present under Involuntary Commitment? No  IVC Papers Initial File Date: No data recorded  [place] of Residence: [place]   Patient Currently Receiving the Following Services: Not Receiving Services   Determination of Need: Routine (7 days)   Options For Referral: Other: Comment (rescue mission)     CCA Biopsychosocial  Intake/Chief Complaint:  CCA Intake With Chief Complaint CCA Part Two Date: 06/21/20 CCA Part Two Time: 1648 Chief Complaint/Presenting Problem: NA Patient's Currently Reported Symptoms/Problems: Suicidal thoughts with a plan, homicidal, substance use, detox. Individual's Strengths: Pt reported, "I have no idea." Individual's Preferences: NA Individual's Abilities: NA Type  of Services Patient Feels Are Needed: Pt reported, to get back in rehab. Initial Clinical Notes/Concerns: NA  Mental Health Symptoms Depression:  Depression: Fatigue, Change in energy/activity, Increase/decrease in appetite, Sleep (too much or little), Hopelessness, Tearfulness, Worthlessness, Duration of symptoms greater than two weeks  Mania:  Mania: None  Anxiety:   Anxiety: Fatigue, Restlessness, Sleep, Worrying, Tension  Psychosis:  Psychosis: Delusions, Hallucinations  Trauma:  Trauma: N/A  Obsessions:  Obsessions: None  Compulsions:  Compulsions: None  Inattention:  Inattention: None  Hyperactivity/Impulsivity:  Hyperactivity/Impulsivity: N/A  Oppositional/Defiant Behaviors:  Oppositional/Defiant Behaviors: None  Emotional Irregularity:  Emotional Irregularity: Recurrent suicidal behaviors/gestures/threats  Other Mood/Personality Symptoms:  Other Mood/Personality Symptoms: Pt was assessed on 06/13/2020 and 10/22 with a similar presentation.   Mental Status Exam Appearance and self-care  Stature:  Stature: Average  Weight:  Weight: Average weight  Clothing:  Clothing: Age-appropriate  Grooming:  Grooming: Normal  Cosmetic use:  Cosmetic Use: Age appropriate  Posture/gait:  Posture/Gait: Normal  Motor activity:  Motor Activity: Not Remarkable  Sensorium  Attention:  Attention: Normal  Concentration:  Concentration: Normal  Orientation:  Orientation: X5  Recall/memory:  Recall/Memory: Normal  Affect and Mood  Affect:  Affect: Depressed  Mood:  Mood: Depressed  Relating  Eye contact:  Eye Contact: Normal  Facial expression:  Facial Expression: Depressed, Sad  Attitude toward examiner:  Attitude Toward Examiner: Cooperative  Thought and Language  Speech flow: Speech Flow: Clear and Coherent, Normal  Thought content:  Thought Content: Appropriate to Mood and Circumstances  Preoccupation:  Preoccupations: None  Hallucinations:  Hallucinations: None  Organization:      Company secretaryxecutive Functions  Fund of Knowledge:  Fund of Knowledge: Average  Intelligence:  Intelligence: Average  Abstraction:  Abstraction: Normal  Judgement:  Judgement: Fair  Dance movement psychotherapisteality Testing:  Reality Testing: Realistic  Insight:  Insight: Fair  Decision Making:  Decision Making: Impulsive  Social Functioning  Social Maturity:  Social Maturity: Responsible  Social Judgement:  Social Judgement: "Chief of Stafftreet Smart"  Stress  Stressors:  Stressors: Veterinary surgeonGrief/losses, Transitions, Surveyor, quantityinancial, Housing  Coping Ability:  Coping Ability: Exhausted, Building surveyorverwhelmed  Skill Deficits:  Skill Deficits: Self-control, Responsibility  Supports:  Supports: Support needed     Religion: Religion/Spirituality Are You A Religious Person?: No  Leisure/Recreation: Leisure / Recreation Do You Have Hobbies?: No  Exercise/Diet: Exercise/Diet Do You Exercise?: No Have You Gained or Lost A Significant Amount of Weight in the Past Six Months?: No Do You Follow a Special Diet?: No Do You Have Any Trouble Sleeping?: Yes Explanation of Sleeping Difficulties: 2 hours, frequent awakenings   CCA Employment/Education  Employment/Work Situation: Employment / Work Situation Employment situation: Unemployed Has patient ever been in the Eli Lilly and Companymilitary?: No  Education: Education Last Grade Completed: 10 Did Garment/textile technologistYou Graduate From [employer] School?: No Did Theme park managerYou Attend College?: No Did Designer, television/film setYou Attend Graduate School?: No   CCA Family/Childhood History  Family and Relationship History: Family history Marital status: Single What is your sexual orientation?: NA Has your sexual activity been affected by drugs, alcohol, medication, or emotional stress?: NA Does patient have children?:  (Pt reported, his child died in 2014.)  Childhood History:  Childhood History Additional childhood history information: NA Description of patient's relationship with caregiver when they were a child: NA Patient's description of current relationship with  people who raised him/her: NA How were you disciplined when you got in trouble as a child/adolescent?: NA Does patient have siblings?: Yes Did patient suffer any verbal/emotional/physical/sexual abuse as a child?: No Did patient suffer from severe childhood neglect?: No Has patient ever been sexually abused/assaulted/raped as an adolescent or adult?: No Was the patient ever a victim of a crime or a disaster?: No Witnessed domestic violence?: No Has patient been affected by domestic violence as an adult?: No (NA)  Child/Adolescent Assessment:     CCA Substance Use  Alcohol/Drug Use: Alcohol / Drug Use Pain Medications: see MAR Prescriptions: see MAR Over the Counter: see MAR History of alcohol / drug use?: Yes Substance #1 Name of Substance 1: Alcohol. 1 - Age of First Use: UTA 1 - Amount (size/oz): reports 1/5 vodka daily until 1 week ago; currently drinking 1 24 oz beer 1 - Frequency: Daily. 1 - Duration: Ongoing. 1 - Last Use / Amount: 10/27 10:30 AM 1 beer Substance #2 Name of Substance 2: Cocaine. 2 - Age of First Use: UTA 2 - Amount (size/oz): Unknown. 2 - Frequency: Ongoing. 2 - Duration: Pt reported, "unsure." 2 - Last Use / Amount: 10/21- a small amount                     ASAM's:  Six Dimensions of Multidimensional Assessment  Dimension 1:  Acute Intoxication and/or Withdrawal Potential:      Dimension 2:  Biomedical Conditions and Complications:      Dimension 3:  Emotional, Behavioral, or Cognitive Conditions and Complications:     Dimension 4:  Readiness to Change:     Dimension 5:  Relapse, Continued use, or Continued Problem Potential:     Dimension 6:  Recovery/Living Environment:     ASAM Severity Score:    ASAM Recommended Level of Treatment:     Substance use Disorder (SUD)    Recommendations for Services/Supports/Treatments: Recommendations for Services/Supports/Treatments Recommendations For Services/Supports/Treatments: Other  (Comment) ([facility] Continuing Observation Unit.)  DSM5 Diagnoses: Patient Active Problem List   Diagnosis Date Noted  . Polysubstance abuse (HCC) 06/21/2020  . Substance induced mood disorder (HCC) 06/21/2020  . Suicidal ideation 06/21/2020    Patient Centered Plan: Patient is on the following Treatment Plan(s):     Referrals to Alternative Service(s): Referred to Alternative Service(s):   Place:   Date:   Time:    Referred to Alternative Service(s):   Place:   Date:   Time:    Referred to Alternative Service(s):   Place:   Date:   Time:    Referred to Alternative Service(s):   Place:   Date:   Time:     Celedonio Miyamoto

## 2020-06-21 NOTE — [facility] Counselor (Signed)
This counselor contacted Greggory Stallion at [facility] at [phone], who states they do not accept referrals this late in the day. He suggests [facility] contact them tomorrow after 8:15 AM for referrals.

## 2020-06-21 NOTE — ED Notes (Signed)
introduced self to pt. Pt had c/o of headache on scale of 6 did not want medication @this  time. Will continue to monitor pt for safety

## 2020-06-21 NOTE — ED Notes (Signed)
29 yo male pt presents with SI w/plan to jump off bridge or run in front of a train. Request ETOH detox and long term tx. Pt endorse AH of people talking to him, VH of people in white mask and paranoid that people are after him. Admitted to continuous assessment for overnight observation. Will monitor for safety.

## 2020-06-21 NOTE — ED Notes (Signed)
Locker #[id number]  

## 2020-06-21 NOTE — ED Notes (Signed)
Patient arrived to observation. Patient submitted  Clothing for wash.  Patient

## 2020-06-21 NOTE — ED Notes (Signed)
Pt sleeping@this  time. Breathing even and unlabored. Noc/o of pain or distress. Will continue to monitor pt fr safety

## 2020-06-21 NOTE — ED Notes (Signed)
Patient given meal. 

## 2020-06-21 NOTE — ED Provider Notes (Signed)
Behavioral Health Admission H&P [facility] & [facility]BS)  Date: 06/21/20 Patient Name: Bryan Harper MRN: [id number] Chief Complaint:  Chief Complaint  Patient presents with   Addiction Problem   Depression   Homeless   Chief Complaint/Presenting Problem: NA  Diagnoses:  Final diagnoses:  Polysubstance abuse ([place])  Suicidal ideation  Substance induced mood disorder (HCC)    HPI:  Bryan Harper, 29 y.o., male patient presents to [facility] as a walk in with complains of substance use disorder, and suicidal ideation.  Patient seen face to face by this provider, consulted with consulted with Dr. Serafina Mitchell; and chart reviewed on 06/21/20.  On evaluation Bryan Harper reports he recently got out of rehab facility ([facility]) about 1 1/2 wk's ago and has relapsed on cocaine and alcohol.  States he has been having passive suicidal thoughts of jumping in front of train.  Patient states the only way he would feel safe is if he was able to get into another rehab facility.  Patient states that he is homeless and has just been roaming the streets since out of rehab.  Patient also endorse auditory and visual hallucinations stating he has been off of his medication Zyprexa and Zoloft for over 3 weeks.  Discussed peer support consult and patient in agreement.  During evaluation Bryan Harper is alert/oriented x 4; calm/cooperative; and mood is congruent with affect.  He/She does not appear to be responding to internal/external stimuli or delusional thoughts; although he is endorsing auditory/visual hallucinations.  Patient answered question appropriately.  Gaspar Bidding with Peer Support met with patient and patient has agreed to go to [facility] after arrangements have been made.   Patient presentation possible secondary gain related to homelessness and seeking shelter; requesting rehab.  Will admit patient to continuous assessment monitor overnight; Discharge in morning.  Possibly accepted to [facility] will need to call again in morning after 8:15 AM; currently to late and not taking any more patients for the day.  PHQ 2-9:     ED from 06/16/2020 in [place] DEPT ED from 06/13/2020 in [place] High Risk Low Risk       Total Time spent with patient: 45 minutes  Musculoskeletal  Strength & Muscle Tone: within normal limits Gait & Station: normal Patient leans: N/A  Psychiatric Specialty Exam  Presentation General Appearance: Appropriate for Environment;Casual  Eye Contact:Good  Speech:Clear and Coherent;Normal Rate  Speech Volume:Normal  Handedness:Right   Mood and Affect  Mood:Depressed  Affect:Congruent   Thought Process  Thought Processes:Coherent;Goal Directed  Descriptions of Associations:Intact  Orientation:Full (Time, Place and Person)  Thought Content:WDL  Hallucinations:Hallucinations: Auditory;Visual Description of Auditory Hallucinations: Unable to state what voices are saying but states he is hearing voices in the back ground Description of Visual Hallucinations: Reports he is seeing people with white face masks on  Ideas of Reference:None  Suicidal Thoughts:Suicidal Thoughts: Yes, Passive SI Passive Intent and/or Plan: Without Intent;Without Plan (States he is homeless and wanting to get into a rehab facility; states he has had passive thoughts of jumping in front of train)  Homicidal Thoughts:Homicidal Thoughts: No   Sensorium  Memory:Immediate Good  Judgment:Intact  Insight:Present;[place]  Concentration:Good  Attention Span:Good  Regino Ramirez  Language:Good   Psychomotor Activity  Psychomotor Activity:Psychomotor Activity: Normal   Assets  Assets:Communication Skills;Desire for Improvement   Sleep  Sleep:Sleep: Good   Physical Exam Constitutional:  General: He  is not in acute distress.    Appearance: Normal appearance. He is toxic-appearing and diaphoretic. He is not ill-appearing.  HENT:     Head: Normocephalic and atraumatic.  Cardiovascular:     Rate and Rhythm: Normal rate and regular rhythm.  Pulmonary:     Effort: Pulmonary effort is normal.     Breath sounds: Normal breath sounds.  Musculoskeletal:        General: Normal range of motion.     Cervical back: Normal range of motion and neck supple.  Skin:    General: Skin is warm.  Neurological:     General: No focal deficit present.     Mental Status: He is alert and oriented to person, place, and time.  Psychiatric:        Attention and Perception: Attention and perception normal.        Mood and Affect: Mood normal.        Speech: Speech normal.        Behavior: Behavior normal. Behavior is cooperative.        Thought Content: Thought content normal. Thought content is not paranoid or delusional. Thought content does not include homicidal ideation. Suicidal: Passive.        Cognition and Memory: Cognition and memory normal.        Judgment: Judgment is impulsive.    Review of Systems  Psychiatric/Behavioral: Hallucinations: Reporting he is having auditory hallucinations; but does not appear t be responding to stimu;i. Suicidal ideas: Reporting passive suicidal ideation; to jump in front of train if unable to get into rehab facility.  The only place he will feel safe. Insomnia: Stable.   All other systems reviewed and are negative.   Blood pressure (!) 147/107, pulse 94, temperature 98.2 F (36.8 C), temperature source Tympanic, resp. rate 18, height 5' 9"  (1.753 m), weight 130 lb (59 kg), SpO2 96 %. Body mass index is 19.2 kg/m.  Past Psychiatric History: Major depression, polysubstance abuse   Is the patient at risk to self? Yes  Has the patient been a risk to self in the past 6 months? No .    Has the patient been a risk to self within the distant past? No   Is the patient  a risk to others? No   Has the patient been a risk to others in the past 6 months? No   Has the patient been a risk to others within the distant past? No   Past Medical History:  Past Medical History:  Diagnosis Date   Bipolar 1 disorder ([place])    ETOH abuse    Polysubstance abuse ([place])    History reviewed. No pertinent surgical history.  Family History: History reviewed. No pertinent family history.  Social History:  Social History   Socioeconomic History   Marital status: Single    Spouse name: Not on file   Number of children: Not on file   Years of education: Not on file   Highest education level: Not on file  Occupational History   Not on file  Tobacco Use   Smoking status: Current Every Day Smoker   Smokeless tobacco: Never Used  Substance and Sexual Activity   Alcohol use: Yes    Comment: 2 hours ago   Drug use: Yes    Types: Cocaine    Comment: 2 hours ago   Sexual activity: Not on file  Other Topics Concern   Not on file  Social History Narrative  Not on file   Social Determinants of Health   Financial Resource Strain:    Difficulty of Paying Living Expenses: Not on file  Food Insecurity:    Worried About [place] in the Last Year: Not on file   Ran Out of Food in the Last Year: Not on file  Transportation Needs:    Lack of Transportation (Medical): Not on file   Lack of Transportation (Non-Medical): Not on file  Physical Activity:    Days of Exercise per Week: Not on file   Minutes of Exercise per Session: Not on file  Stress:    Feeling of Stress : Not on file  Social Connections:    Frequency of Communication with Friends and Family: Not on file   Frequency of Social Gatherings with Friends and Family: Not on file   Attends Religious Services: Not on file   Active Member of Clubs or Organizations: Not on file   Attends Archivist Meetings: Not on file   Marital Status: Not on file  Intimate  Partner Violence:    Fear of Current or Ex-Partner: Not on file   Emotionally Abused: Not on file   Physically Abused: Not on file   Sexually Abused: Not on file    SDOH:  SDOH Screenings   Alcohol Screen:    Last Alcohol Screening Score (AUDIT): Not on file  Depression (PHQ2-9):    PHQ-2 Score: Not on file  Financial Resource Strain:    Difficulty of Paying Living Expenses: Not on file  Food Insecurity:    Worried About Charity fundraiser in the Last Year: Not on file   [employer] of Food in the Last Year: Not on file  Housing:    Last Housing Risk Score: Not on file  Physical Activity:    Days of Exercise per Week: Not on file   Minutes of Exercise per Session: Not on file  Social Connections:    Frequency of Communication with Friends and Family: Not on file   Frequency of Social Gatherings with Friends and Family: Not on file   Attends Religious Services: Not on file   Active Member of Clubs or Organizations: Not on file   Attends Archivist Meetings: Not on file   Marital Status: Not on file  Stress:    Feeling of Stress : Not on file  Tobacco Use: High Risk   Smoking Tobacco Use: Current Every Day Smoker   Smokeless Tobacco Use: Never Used  Transportation Needs:    Film/video editor (Medical): Not on file   Lack of Transportation (Non-Medical): Not on file    Last Labs:  Admission on 06/16/2020, Discharged on 06/17/2020  Component Date Value Ref Range Status   Sodium 06/16/2020 143  135 - 145 mmol/L Final   Potassium 06/16/2020 4.2  3.5 - 5.1 mmol/L Final   Chloride 06/16/2020 104  98 - 111 mmol/L Final   CO2 06/16/2020 23  22 - 32 mmol/L Final   Glucose, Bld 06/16/2020 81  70 - 99 mg/dL Final   Glucose reference range applies only to samples taken after fasting for at least 8 hours.   BUN 06/16/2020 13  6 - 20 mg/dL Final   Creatinine, Ser 06/16/2020 0.87  0.61 - 1.24 mg/dL Final   Calcium 06/16/2020 8.8* 8.9 -  10.3 mg/dL Final   Total Protein 06/16/2020 7.5  6.5 - 8.1 g/dL Final   Albumin 06/16/2020 4.9  3.5 - 5.0  g/dL Final   AST 06/16/2020 48* 15 - 41 U/L Final   ALT 06/16/2020 37  0 - 44 U/L Final   Alkaline Phosphatase 06/16/2020 52  38 - 126 U/L Final   Total Bilirubin 06/16/2020 1.3* 0.3 - 1.2 mg/dL Final   GFR, Estimated 06/16/2020 >60  >60 mL/min Final   Comment: (NOTE) Calculated using the CKD-EPI Creatinine Equation (2021)    Anion gap 06/16/2020 16* 5 - 15 Final   Performed at [facility], [place] [address] [place], [place] [address]   Alcohol, Ethyl (B) 06/16/2020 275* <10 mg/dL Final   Comment: (NOTE) Lowest detectable limit for serum alcohol is 10 mg/dL.  For medical purposes only. Performed at [facility], [place] [address], [place], [place] [address]    Salicylate Lvl 29/56/2130 <7.0* 7.0 - 30.0 mg/dL Final   Performed at [place] [address], [place], [place] [address]   Acetaminophen (Tylenol), Serum 06/16/2020 <10* 10 - 30 ug/mL Final   Comment: (NOTE) Therapeutic concentrations vary significantly. A range of 10-30 ug/mL  may be an effective concentration for many patients. However, some  are best treated at concentrations outside of this range. Acetaminophen concentrations >150 ug/mL at 4 hours after ingestion  and >50 ug/mL at 12 hours after ingestion are often associated with  toxic reactions.  Performed at [facility], [place] [address] [place], [place] [address]    WBC 06/16/2020 7.3  4.0 - 10.5 K/uL Final   RBC 06/16/2020 4.51  4.22 - 5.81 MIL/uL Final   Hemoglobin 06/16/2020 15.2  13.0 - 17.0 g/dL Final   HCT 06/16/2020 44.6  39 - 52 % Final   MCV 06/16/2020 98.9  80.0 - 100.0 fL Final   MCH 06/16/2020 33.7  26.0 - 34.0 pg Final   MCHC 06/16/2020 34.1  30.0 - 36.0 g/dL Final   RDW 06/16/2020 13.7  11.5 - 15.5 % Final   Platelets 06/16/2020 270  150 - 400  K/uL Final   nRBC 06/16/2020 0.0  0.0 - 0.2 % Final   Performed at [facility], [place] [address], [place], [place] [address]   Opiates 06/16/2020 NONE DETECTED  NONE DETECTED Final   Cocaine 06/16/2020 POSITIVE* NONE DETECTED Final   Benzodiazepines 06/16/2020 NONE DETECTED  NONE DETECTED Final   Amphetamines 06/16/2020 NONE DETECTED  NONE DETECTED Final   Tetrahydrocannabinol 06/16/2020 NONE DETECTED  NONE DETECTED Final   Barbiturates 06/16/2020 NONE DETECTED  NONE DETECTED Final   Comment: (NOTE) DRUG SCREEN FOR MEDICAL PURPOSES ONLY.  IF CONFIRMATION IS NEEDED FOR ANY PURPOSE, NOTIFY LAB WITHIN 5 DAYS.  LOWEST DETECTABLE LIMITS FOR URINE DRUG SCREEN Drug Class                     Cutoff (ng/mL) Amphetamine and metabolites    1000 Barbiturate and metabolites    200 Benzodiazepine                 132 Tricyclics and metabolites     300 Opiates and metabolites        300 Cocaine and metabolites        300 THC                            50 Performed at [facility], [place] [address] [place], [place] [address]    SARS Coronavirus 2 by RT PCR 06/16/2020  NEGATIVE  NEGATIVE Final   Comment: (NOTE) SARS-CoV-2 target nucleic acids are NOT DETECTED.  The SARS-CoV-2 RNA is generally detectable in upper respiratoy specimens during the acute phase of infection. The lowest concentration of SARS-CoV-2 viral copies this assay can detect is 131 copies/mL. A negative result does not preclude SARS-Cov-2 infection and should not be used as the sole basis for treatment or other patient management decisions. A negative result may occur with  improper specimen collection/handling, submission of specimen other than nasopharyngeal swab, presence of viral mutation(s) within the areas targeted by this assay, and inadequate number of viral copies (<131 copies/mL). A negative result must be combined with clinical observations, patient history, and  epidemiological information. The expected result is Negative.  Fact Sheet for Patients:  PinkCheek.be  Fact Sheet for Healthcare Providers:  GravelBags.it  This test is no                          t yet approved or cleared by the [place] FDA and  has been authorized for detection and/or diagnosis of SARS-CoV-2 by FDA under an Emergency Use Authorization (EUA). This EUA will remain  in effect (meaning this test can be used) for the duration of the COVID-19 declaration under Section 564(b)(1) of the Act, 21 U.S.C. section 360bbb-3(b)(1), unless the authorization is terminated or revoked sooner.     Influenza A by PCR 06/16/2020 NEGATIVE  NEGATIVE Final   Influenza B by PCR 06/16/2020 NEGATIVE  NEGATIVE Final   Comment: (NOTE) The Xpert Xpress SARS-CoV-2/FLU/RSV assay is intended as an aid in  the diagnosis of influenza from Nasopharyngeal swab specimens and  should not be used as a sole basis for treatment. Nasal washings and  aspirates are unacceptable for Xpert Xpress SARS-CoV-2/FLU/RSV  testing.  Fact Sheet for Patients: PinkCheek.be  Fact Sheet for Healthcare Providers: GravelBags.it  This test is not yet approved or cleared by the [place] FDA and  has been authorized for detection and/or diagnosis of SARS-CoV-2 by  FDA under an Emergency Use Authorization (EUA). This EUA will remain  in effect (meaning this test can be used) for the duration of the  Covid-19 declaration under Section 564(b)(1) of the Act, 21  U.S.C. section 360bbb-3(b)(1), unless the authorization is  terminated or revoked. Performed at [facility], [place] [address] [place], [place] [address]   Admission on 06/13/2020, Discharged on 06/14/2020  Component Date Value Ref Range Status   Sodium 06/13/2020 141  135 - 145 mmol/L Final   Potassium 06/13/2020  4.1  3.5 - 5.1 mmol/L Final   Chloride 06/13/2020 102  98 - 111 mmol/L Final   CO2 06/13/2020 27  22 - 32 mmol/L Final   Glucose, Bld 06/13/2020 67* 70 - 99 mg/dL Final   Glucose reference range applies only to samples taken after fasting for at least 8 hours.   BUN 06/13/2020 12  6 - 20 mg/dL Final   Creatinine, Ser 06/13/2020 0.92  0.61 - 1.24 mg/dL Final   Calcium 06/13/2020 9.3  8.9 - 10.3 mg/dL Final   Total Protein 06/13/2020 7.4  6.5 - 8.1 g/dL Final   Albumin 06/13/2020 4.7  3.5 - 5.0 g/dL Final   AST 06/13/2020 32  15 - 41 U/L Final   ALT 06/13/2020 34  0 - 44 U/L Final   Alkaline Phosphatase 06/13/2020 47  38 - 126 U/L Final   Total Bilirubin 06/13/2020 0.6  0.3 - 1.2  mg/dL Final   GFR, Estimated 06/13/2020 >60  >60 mL/min Final   Anion gap 06/13/2020 12  5 - 15 Final   Performed at [place] [address], [place], [place] [address]   Alcohol, Ethyl (B) 06/13/2020 11* <10 mg/dL Final   Comment: (NOTE) Lowest detectable limit for serum alcohol is 10 mg/dL.  For medical purposes only. Performed at [facility], [place] [address], [place], [place] [address]    Salicylate Lvl 54/56/2563 <7.0* 7.0 - 30.0 mg/dL Final   Performed at [place] [address], [place], [place] [address]   Acetaminophen (Tylenol), Serum 06/13/2020 <10* 10 - 30 ug/mL Final   Comment: (NOTE) Therapeutic concentrations vary significantly. A range of 10-30 ug/mL  may be an effective concentration for many patients. However, some  are best treated at concentrations outside of this range. Acetaminophen concentrations >150 ug/mL at 4 hours after ingestion  and >50 ug/mL at 12 hours after ingestion are often associated with  toxic reactions.  Performed at [facility], [place] [address] [place], [place] [address]    WBC 06/13/2020 9.7  4.0 - 10.5 K/uL Final   RBC 06/13/2020 4.72  4.22 - 5.81 MIL/uL Final   Hemoglobin 06/13/2020 15.4  13.0 - 17.0 g/dL  Final   HCT 06/13/2020 46.5  39 - 52 % Final   MCV 06/13/2020 98.5  80.0 - 100.0 fL Final   MCH 06/13/2020 32.6  26.0 - 34.0 pg Final   MCHC 06/13/2020 33.1  30.0 - 36.0 g/dL Final   RDW 06/13/2020 13.7  11.5 - 15.5 % Final   Platelets 06/13/2020 334  150 - 400 K/uL Final   nRBC 06/13/2020 0.0  0.0 - 0.2 % Final   Performed at [place] [address], [place], [place] [address]   Opiates 06/13/2020 NONE DETECTED  NONE DETECTED Final   Cocaine 06/13/2020 POSITIVE* NONE DETECTED Final   Benzodiazepines 06/13/2020 NONE DETECTED  NONE DETECTED Final   Amphetamines 06/13/2020 NONE DETECTED  NONE DETECTED Final   Tetrahydrocannabinol 06/13/2020 NONE DETECTED  NONE DETECTED Final   Barbiturates 06/13/2020 NONE DETECTED  NONE DETECTED Final   Comment: (NOTE) DRUG SCREEN FOR MEDICAL PURPOSES ONLY.  IF CONFIRMATION IS NEEDED FOR ANY PURPOSE, NOTIFY LAB WITHIN 5 DAYS.  LOWEST DETECTABLE LIMITS FOR URINE DRUG SCREEN Drug Class                     Cutoff (ng/mL) Amphetamine and metabolites    1000 Barbiturate and metabolites    200 Benzodiazepine                 262 Tricyclics and metabolites     300 Opiates and metabolites        300 Cocaine and metabolites        300 THC                            50 Performed at [facility], [place] [address] [place], [place] [address]    SARS Coronavirus 2 by RT PCR 06/13/2020 NEGATIVE  NEGATIVE Final   Comment: (NOTE) SARS-CoV-2 target nucleic acids are NOT DETECTED.  The SARS-CoV-2 RNA is generally detectable in upper respiratoy specimens during the acute phase of infection. The lowest concentration of SARS-CoV-2 viral copies this assay can detect is 131 copies/mL. A negative result does not preclude SARS-Cov-2 infection and  should not be used as the sole basis for treatment or other patient management decisions. A negative result may occur with  improper specimen collection/handling, submission of specimen  other than nasopharyngeal swab, presence of viral mutation(s) within the areas targeted by this assay, and inadequate number of viral copies (<131 copies/mL). A negative result must be combined with clinical observations, patient history, and epidemiological information. The expected result is Negative.  Fact Sheet for Patients:  PinkCheek.be  Fact Sheet for Healthcare Providers:  GravelBags.it  This test is no                          t yet approved or cleared by the [place] FDA and  has been authorized for detection and/or diagnosis of SARS-CoV-2 by FDA under an Emergency Use Authorization (EUA). This EUA will remain  in effect (meaning this test can be used) for the duration of the COVID-19 declaration under Section 564(b)(1) of the Act, 21 U.S.C. section 360bbb-3(b)(1), unless the authorization is terminated or revoked sooner.     Influenza A by PCR 06/13/2020 NEGATIVE  NEGATIVE Final   Influenza B by PCR 06/13/2020 NEGATIVE  NEGATIVE Final   Comment: (NOTE) The Xpert Xpress SARS-CoV-2/FLU/RSV assay is intended as an aid in  the diagnosis of influenza from Nasopharyngeal swab specimens and  should not be used as a sole basis for treatment. Nasal washings and  aspirates are unacceptable for Xpert Xpress SARS-CoV-2/FLU/RSV  testing.  Fact Sheet for Patients: PinkCheek.be  Fact Sheet for Healthcare Providers: GravelBags.it  This test is not yet approved or cleared by the [place] FDA and  has been authorized for detection and/or diagnosis of SARS-CoV-2 by  FDA under an Emergency Use Authorization (EUA). This EUA will remain  in effect (meaning this test can be used) for the duration of the  Covid-19 declaration under Section 564(b)(1) of the Act, 21  U.S.C. section 360bbb-3(b)(1), unless the authorization is  terminated or revoked. Performed at  [facility], [place] [address], [place], [place] [address]    Glucose-Capillary 06/13/2020 138* 70 - 99 mg/dL Final   Glucose reference range applies only to samples taken after fasting for at least 8 hours.   Comment 1 06/13/2020 Document in Chart   Final    Allergies: Patient has no known allergies.  PTA Medications: (Not in a hospital admission)   Medical Decision Making  Admitted to continuous assessment Routine labs and EKG ordered Restarted home medications Zoloft 25 mg daily and Zyprexa 5 mg Qhs; added Vistaril 25 mg Tid prn for anxiety Peer support ordered to assist patient with rehab facility     Recommendations  Based on my evaluation the patient does not appear to have an emergency medical condition.  Tharun Cappella, NP 06/21/20  4:55 PM

## 2020-06-21 NOTE — Discharge Instructions (Addendum)

## 2020-06-22 NOTE — ED Notes (Signed)
Breakfast given.  

## 2020-06-22 NOTE — ED Notes (Signed)
Pt sleeping@this time. Will continue to monitor for safety 

## 2020-06-22 NOTE — ED Notes (Signed)
Nurse Discharge Note:  D:Patient denies SI/HI/AVH at this time. Pt appears calm and cooperative, and no distress noted.  A: All Personal items in locker returned to pt. Pt given AVS/ Follow-Up. Patient given a list of available resources he can contact. Pt escorted off unit to meet safe transport driver.  R:  Pt voiced no concerns

## 2020-06-22 NOTE — ED Notes (Addendum)
Pt asleep with even and unlabored respirations. No distress or discomfort noted. Pt remains safe on the unit. Will continue to monitor. 

## 2020-06-22 NOTE — ED Provider Notes (Signed)
[facility]BC/OBS ASAP Discharge Summary  Date and Time: 06/22/2020 9:32 AM  Name: Bryan Harper  MRN:  [id number]   Discharge Diagnoses:  Final diagnoses:  Polysubstance abuse (HCC)  Suicidal ideation  Substance induced mood disorder (HCC)    Subjective: Patient reports that he was feeling better.  He states that he slept well last night.  Patient is denying any suicidal or homicidal ideations and denies any hallucinations.  Patient denies any withdrawal symptoms.  Patient states that he did contact [place] [facility] and that they are expecting him anytime today.  Patient states that he feels that this would be a good fit for him and he can help him get into a safe and stable environment as well as getting back into the workforce.  He states that this will also this ability to remain sober.  Patient states that he feels safe to discharge today.  Stay Summary: Patient is a 29 year old male who presented to the [facility] as a walk-in with complaints of suicidal ideations with substance abuse.  Patient had reported that he had left the facility approximately 1/2 weeks ago and has relapsed on alcohol and cocaine and is now having passive suicidal ideations with a plan to jump in front of a train.  Patient is reporting wanting to get back into a stable facility.  Patient was admitted to the continuous observation unit.  Today the patient had reported that he was feeling better and that he was looking to go to the [facility] [employer] and he contacted them and that he will can't come there anytime today.  Patient had denied any suicidal or homicidal ideations and denied any hallucinations.  After I assessed the patient he reports nursing staff that he was actually going to go to his parents.  He states that they have made arrangements for him to pick up a bus pass at the [employer] bus station and that he needs transportation there.  Patient has continued to deny any suicidal or homicidal ideations and denies  any hallucinations.  Patient will be provided safe transport to [employer] bus station.  Patient is instructed to follow-up with outpatient when he arrives to his destination.  Total Time spent with patient: 30 minutes  Past Psychiatric History: Bipolar I, polysubstance abuse Past Medical History:  Past Medical History:  Diagnosis Date  . Bipolar 1 disorder (HCC)   . ETOH abuse   . Polysubstance abuse (HCC)    History reviewed. No pertinent surgical history. Family History: History reviewed. No pertinent family history. Family Psychiatric History: None reported Social History:  Social History   Substance and Sexual Activity  Alcohol Use Yes   Comment: 2 hours ago     Social History   Substance and Sexual Activity  Drug Use Yes  . Types: Cocaine   Comment: 2 hours ago    Social History   Socioeconomic History  . Marital status: Single    Spouse name: Not on file  . Number of children: Not on file  . Years of education: Not on file  . Highest education level: Not on file  Occupational History  . Not on file  Tobacco Use  . Smoking status: Current Every Day Smoker  . Smokeless tobacco: Never Used  Substance and Sexual Activity  . Alcohol use: Yes    Comment: 2 hours ago  . Drug use: Yes    Types: Cocaine    Comment: 2 hours ago  . Sexual activity: Not on file  Other  Topics Concern  . Not on file  Social History Narrative  . Not on file   Social Determinants of Health   Financial Resource Strain:   . Difficulty of Paying Living Expenses: Not on file  Food Insecurity:   . Worried About Programme researcher, broadcasting/film/video in the Last Year: Not on file  . Ran Out of Food in the Last Year: Not on file  Transportation Needs:   . Lack of Transportation (Medical): Not on file  . Lack of Transportation (Non-Medical): Not on file  Physical Activity:   . Days of Exercise per Week: Not on file  . Minutes of Exercise per Session: Not on file  Stress:   . Feeling of Stress : Not on  file  Social Connections:   . Frequency of Communication with Friends and Family: Not on file  . Frequency of Social Gatherings with Friends and Family: Not on file  . Attends Religious Services: Not on file  . Active Member of Clubs or Organizations: Not on file  . Attends Banker Meetings: Not on file  . Marital Status: Not on file   SDOH:  SDOH Screenings   Alcohol Screen:   . Last Alcohol Screening Score (AUDIT): Not on file  Depression (PHQ2-9):   . PHQ-2 Score: Not on file  Financial Resource Strain:   . Difficulty of Paying Living Expenses: Not on file  Food Insecurity:   . Worried About Programme researcher, broadcasting/film/video in the Last Year: Not on file  . Ran Out of Food in the Last Year: Not on file  Housing:   . Last Housing Risk Score: Not on file  Physical Activity:   . Days of Exercise per Week: Not on file  . Minutes of Exercise per Session: Not on file  Social Connections:   . Frequency of Communication with Friends and Family: Not on file  . Frequency of Social Gatherings with Friends and Family: Not on file  . Attends Religious Services: Not on file  . Active Member of Clubs or Organizations: Not on file  . Attends Banker Meetings: Not on file  . Marital Status: Not on file  Stress:   . Feeling of Stress : Not on file  Tobacco Use: High Risk  . Smoking Tobacco Use: Current Every Day Smoker  . Smokeless Tobacco Use: Never Used  Transportation Needs:   . Freight forwarder (Medical): Not on file  . Lack of Transportation (Non-Medical): Not on file    Has this patient used any form of tobacco in the last 30 days? (Cigarettes, Smokeless Tobacco, Cigars, and/or Pipes) A prescription for an FDA-approved tobacco cessation medication was offered at discharge and the patient refused  Current Medications:  Current Facility-Administered Medications  Medication Dose Route Frequency Provider Last Rate Last Admin  . acetaminophen (TYLENOL) tablet 650  mg  650 mg Oral Q6H PRN Rankin, Shuvon B, NP      . hydrOXYzine (ATARAX/VISTARIL) tablet 25 mg  25 mg Oral TID PRN Rankin, Shuvon B, NP       No current outpatient medications on file.    PTA Medications: (Not in a hospital admission)   Musculoskeletal  Strength & Muscle Tone: within normal limits Gait & Station: normal Patient leans: N/A  Psychiatric Specialty Exam  Presentation  General Appearance: Appropriate for Environment;Casual  Eye Contact:Good  Speech:Clear and Coherent;Normal Rate  Speech Volume:Normal  Handedness:Right   Mood and Affect  Mood:Euthymic  Affect:Congruent;Appropriate   Thought  Process  Thought Processes:Coherent  Descriptions of Associations:Intact  Orientation:Full (Time, Place and Person)  Thought Content:WDL  Hallucinations:Hallucinations: None Description of Auditory Hallucinations: Unable to state what voices are saying but states he is hearing voices in the back ground Description of Visual Hallucinations: Reports he is seeing people with white face masks on  Ideas of Reference:None  Suicidal Thoughts:Suicidal Thoughts: No SI Passive Intent and/or Plan: Without Intent;Without Plan (States he is homeless and wanting to get into a rehab facility; states he has had passive thoughts of jumping in front of train)  Homicidal Thoughts:Homicidal Thoughts: No   Sensorium  Memory:Immediate Good;Recent Good;Remote Good  Judgment:Fair  Insight:Fair   Executive Functions  Concentration:Good  Attention Span:Good  Recall:Good  Fund of Knowledge:Good  Language:Good   Psychomotor Activity  Psychomotor Activity:Psychomotor Activity: Normal   Assets  Assets:Communication Skills;Desire for Improvement;Housing;Physical Health;Social Support;Transportation   Sleep  Sleep:Sleep: Good   Physical Exam  Physical Exam Vitals and nursing note reviewed.  Constitutional:      Appearance: He is well-developed.  HENT:      Head: Normocephalic.  Eyes:     Pupils: Pupils are equal, round, and reactive to light.  Cardiovascular:     Rate and Rhythm: Normal rate.  Pulmonary:     Effort: Pulmonary effort is normal.  Musculoskeletal:        General: Normal range of motion.  Neurological:     Mental Status: He is alert and oriented to person, place, and time.    Review of Systems  Constitutional: Negative.   HENT: Negative.   Eyes: Negative.   Respiratory: Negative.   Cardiovascular: Negative.   Gastrointestinal: Negative.   Genitourinary: Negative.   Musculoskeletal: Negative.   Skin: Negative.   Neurological: Negative.   Endo/Heme/Allergies: Negative.   Psychiatric/Behavioral: Positive for substance abuse.   Blood pressure 140/86, pulse 67, temperature 97.7 F (36.5 C), temperature source Oral, resp. rate 18, height 5\' 9"  (1.753 m), weight 130 lb (59 kg), SpO2 98 %. Body mass index is 19.2 kg/m.  Demographic Factors:  Male, Caucasian and Low socioeconomic status  Loss Factors: NA  Historical Factors: NA  Risk Reduction Factors:   Sense of responsibility to family, Living with another person, especially a relative and Positive social support  Continued Clinical Symptoms:  Alcohol/Substance Abuse/Dependencies  Cognitive Features That Contribute To Risk:  None    Suicide Risk:  Minimal: No identifiable suicidal ideation.  Patients presenting with no risk factors but with morbid ruminations; may be classified as minimal risk based on the severity of the depressive symptoms  Plan Of Care/Follow-up recommendations:  Continue activity as tolerated. Continue diet as recommended by your PCP. Ensure to keep all appointments with outpatient providers.  [place] with [place]  , FNP 06/22/2020, 9:32 AM

## 2020-06-22 NOTE — ED Notes (Signed)
Meal given

## 2020-10-31 ENCOUNTER — Other Ambulatory Visit: Payer: Self-pay

## 2020-10-31 ENCOUNTER — Emergency Department (HOSPITAL_COMMUNITY): Payer: Self-pay

## 2020-10-31 ENCOUNTER — Encounter (HOSPITAL_COMMUNITY): Payer: Self-pay | Admitting: Registered Nurse

## 2020-10-31 ENCOUNTER — Encounter (HOSPITAL_COMMUNITY): Payer: Self-pay

## 2020-10-31 ENCOUNTER — Emergency Department (HOSPITAL_COMMUNITY)
Admission: EM | Admit: 2020-10-31 | Discharge: 2020-10-31 | Disposition: A | Discharge status: Other Acute Inpatient Hospital | Payer: Self-pay | Attending: Emergency Medicine | Admitting: Emergency Medicine

## 2020-10-31 ENCOUNTER — Ambulatory Visit (HOSPITAL_COMMUNITY)
Admission: EM | Admit: 2020-10-31 | Discharge: 2020-11-01 | Disposition: A | Discharge status: Other Acute Inpatient Hospital | Payer: No Payment, Other | Attending: Registered Nurse | Admitting: Registered Nurse

## 2020-10-31 DIAGNOSIS — F1924 Other psychoactive substance dependence with psychoactive substance-induced mood disorder: Secondary | ICD-10-CM | POA: Insufficient documentation

## 2020-10-31 DIAGNOSIS — F1910 Other psychoactive substance abuse, uncomplicated: Secondary | ICD-10-CM | POA: Diagnosis present

## 2020-10-31 DIAGNOSIS — Z5900 Homelessness unspecified: Secondary | ICD-10-CM | POA: Insufficient documentation

## 2020-10-31 DIAGNOSIS — F10920 Alcohol use, unspecified with intoxication, uncomplicated: Secondary | ICD-10-CM | POA: Diagnosis present

## 2020-10-31 DIAGNOSIS — F17200 Nicotine dependence, unspecified, uncomplicated: Secondary | ICD-10-CM | POA: Insufficient documentation

## 2020-10-31 DIAGNOSIS — G8104 Flaccid hemiplegia affecting left nondominant side: Secondary | ICD-10-CM

## 2020-10-31 DIAGNOSIS — R45851 Suicidal ideations: Secondary | ICD-10-CM

## 2020-10-31 DIAGNOSIS — R202 Paresthesia of skin: Secondary | ICD-10-CM

## 2020-10-31 DIAGNOSIS — F10220 Alcohol dependence with intoxication, uncomplicated: Secondary | ICD-10-CM | POA: Insufficient documentation

## 2020-10-31 DIAGNOSIS — T40602D Poisoning by unspecified narcotics, intentional self-harm, subsequent encounter: Secondary | ICD-10-CM

## 2020-10-31 DIAGNOSIS — T40602A Poisoning by unspecified narcotics, intentional self-harm, initial encounter: Secondary | ICD-10-CM

## 2020-10-31 DIAGNOSIS — T401X2A Poisoning by heroin, intentional self-harm, initial encounter: Secondary | ICD-10-CM | POA: Insufficient documentation

## 2020-10-31 DIAGNOSIS — F1994 Other psychoactive substance use, unspecified with psychoactive substance-induced mood disorder: Secondary | ICD-10-CM | POA: Diagnosis present

## 2020-10-31 DIAGNOSIS — F10120 Alcohol abuse with intoxication, uncomplicated: Secondary | ICD-10-CM | POA: Insufficient documentation

## 2020-10-31 DIAGNOSIS — F1914 Other psychoactive substance abuse with psychoactive substance-induced mood disorder: Secondary | ICD-10-CM | POA: Insufficient documentation

## 2020-10-31 DIAGNOSIS — Z20822 Contact with and (suspected) exposure to covid-19: Secondary | ICD-10-CM | POA: Insufficient documentation

## 2020-10-31 DIAGNOSIS — R7401 Elevation of levels of liver transaminase levels: Secondary | ICD-10-CM | POA: Insufficient documentation

## 2020-10-31 LAB — CBC WITH DIFFERENTIAL/PLATELET
Abs Immature Granulocytes: 0.03 10*3/uL (ref 0.00–0.07)
Basophils Absolute: 0 10*3/uL (ref 0.0–0.1)
Basophils Relative: 0 %
Eosinophils Absolute: 0 10*3/uL (ref 0.0–0.5)
Eosinophils Relative: 0 %
HCT: 40.8 % (ref 39.0–52.0)
Hemoglobin: 13.8 g/dL (ref 13.0–17.0)
Immature Granulocytes: 0 %
Lymphocytes Relative: 5 %
Lymphs Abs: 0.7 10*3/uL (ref 0.7–4.0)
MCH: 32.8 pg (ref 26.0–34.0)
MCHC: 33.8 g/dL (ref 30.0–36.0)
MCV: 96.9 fL (ref 80.0–100.0)
Monocytes Absolute: 0.5 10*3/uL (ref 0.1–1.0)
Monocytes Relative: 4 %
Neutro Abs: 13 10*3/uL — ABNORMAL HIGH (ref 1.7–7.7)
Neutrophils Relative %: 91 %
Platelets: 220 10*3/uL (ref 150–400)
RBC: 4.21 MIL/uL — ABNORMAL LOW (ref 4.22–5.81)
RDW: 15 % (ref 11.5–15.5)
WBC: 14.2 10*3/uL — ABNORMAL HIGH (ref 4.0–10.5)
nRBC: 0 % (ref 0.0–0.2)

## 2020-10-31 LAB — RESP PANEL BY RT-PCR (FLU A&B, COVID) ARPGX2
Influenza A by PCR: NEGATIVE
Influenza B by PCR: NEGATIVE
SARS Coronavirus 2 by RT PCR: NEGATIVE

## 2020-10-31 LAB — RAPID URINE DRUG SCREEN, HOSP PERFORMED
Amphetamines: NOT DETECTED
Barbiturates: NOT DETECTED
Benzodiazepines: NOT DETECTED
Cocaine: NOT DETECTED
Opiates: NOT DETECTED
Tetrahydrocannabinol: NOT DETECTED

## 2020-10-31 LAB — COMPREHENSIVE METABOLIC PANEL
ALT: 94 U/L — ABNORMAL HIGH (ref 0–44)
AST: 193 U/L — ABNORMAL HIGH (ref 15–41)
Albumin: 4.6 g/dL (ref 3.5–5.0)
Alkaline Phosphatase: 60 U/L (ref 38–126)
Anion gap: 14 (ref 5–15)
BUN: 10 mg/dL (ref 6–20)
CO2: 24 mmol/L (ref 22–32)
Calcium: 8.7 mg/dL — ABNORMAL LOW (ref 8.9–10.3)
Chloride: 102 mmol/L (ref 98–111)
Creatinine, Ser: 0.68 mg/dL (ref 0.61–1.24)
GFR, Estimated: >60 mL/min (ref >60)
Glucose, Bld: 117 mg/dL — ABNORMAL HIGH (ref 70–99)
Potassium: 3.8 mmol/L (ref 3.5–5.1)
Sodium: 140 mmol/L (ref 135–145)
Total Bilirubin: 0.5 mg/dL (ref 0.3–1.2)
Total Protein: 7.2 g/dL (ref 6.5–8.1)

## 2020-10-31 LAB — ETHANOL: Alcohol, Ethyl (B): 104 mg/dL — ABNORMAL HIGH (ref <10)

## 2020-10-31 LAB — ACETAMINOPHEN LEVEL: Acetaminophen (Tylenol), Serum: <10 ug/mL — ABNORMAL LOW (ref 10–30)

## 2020-10-31 LAB — SALICYLATE LEVEL: Salicylate Lvl: <7 mg/dL — ABNORMAL LOW (ref 7.0–30.0)

## 2020-10-31 LAB — CBG MONITORING, ED: Glucose-Capillary: 89 mg/dL (ref 70–99)

## 2020-10-31 MED ORDER — NICOTINE 14 MG/24HR TD PT24: 14.0000 mg | MEDICATED_PATCH | Freq: Once | TRANSDERMAL | Status: DC

## 2020-10-31 MED ORDER — ONDANSETRON 8 MG PO TBDP
8.0000 mg | ORAL_TABLET | Freq: Once | ORAL | Status: AC
  Administered 2020-10-31: 8 mg via ORAL
  Filled 2020-10-31: qty 1

## 2020-10-31 MED ORDER — ALUM & MAG HYDROXIDE-SIMETH 200-200-20 MG/5ML PO SUSP: 30.0000 mL | ORAL | Status: DC | PRN

## 2020-10-31 MED ORDER — GABAPENTIN 100 MG PO CAPS
200.0000 mg | ORAL_CAPSULE | Freq: Three times a day (TID) | ORAL | Status: DC
  Administered 2020-10-31 (×2): 200 mg via ORAL
  Filled 2020-10-31 (×2): qty 2

## 2020-10-31 MED ORDER — MAGNESIUM HYDROXIDE 400 MG/5ML PO SUSP: 30.0000 mL | Freq: Every day | ORAL | Status: DC | PRN

## 2020-10-31 MED ORDER — IOHEXOL 350 MG/ML SOLN
75.0000 mL | Freq: Once | INTRAVENOUS | Status: AC | PRN
  Administered 2020-10-31: 75 mL via INTRAVENOUS

## 2020-10-31 MED ORDER — ACETAMINOPHEN 325 MG PO TABS: 650.0000 mg | ORAL_TABLET | ORAL | Status: DC | PRN

## 2020-10-31 MED ORDER — FLUOXETINE HCL 20 MG PO CAPS
20.0000 mg | ORAL_CAPSULE | Freq: Every day | ORAL | Status: DC
  Administered 2020-10-31: 20 mg via ORAL
  Filled 2020-10-31: qty 1

## 2020-10-31 MED ORDER — GABAPENTIN 100 MG PO CAPS
200.0000 mg | ORAL_CAPSULE | Freq: Three times a day (TID) | ORAL | Status: DC
  Administered 2020-10-31: 200 mg via ORAL
  Filled 2020-10-31: qty 2

## 2020-10-31 MED ORDER — LORAZEPAM 2 MG/ML IJ SOLN
1.0000 mg | Freq: Once | INTRAMUSCULAR | Status: AC
  Administered 2020-10-31: 1 mg via INTRAVENOUS
  Filled 2020-10-31: qty 1

## 2020-10-31 MED ORDER — ALUM & MAG HYDROXIDE-SIMETH 200-200-20 MG/5ML PO SUSP: 30.0000 mL | Freq: Four times a day (QID) | ORAL | Status: DC | PRN

## 2020-10-31 MED ORDER — ACETAMINOPHEN 325 MG PO TABS: 650.0000 mg | ORAL_TABLET | Freq: Four times a day (QID) | ORAL | Status: DC | PRN

## 2020-10-31 MED ORDER — ONDANSETRON HCL 4 MG/2ML IJ SOLN
4.0000 mg | Freq: Once | INTRAMUSCULAR | Status: AC
  Administered 2020-10-31: 4 mg via INTRAVENOUS
  Filled 2020-10-31: qty 2

## 2020-10-31 MED ORDER — ONDANSETRON HCL 4 MG PO TABS: 4.0000 mg | ORAL_TABLET | Freq: Three times a day (TID) | ORAL | Status: DC | PRN

## 2020-10-31 NOTE — ED Notes (Signed)
Pt was dressed out in burgundy scrubs and yellow socks. All pt belongings are located in cabinet labeled "Patient belongings 16-18 RESUS A" (Two white belonging bags and one large black bag).

## 2020-10-31 NOTE — ED Provider Notes (Addendum)
  Physical Exam  BP 133/81   Pulse 81   Temp 97.9 F (36.6 C) (Oral)   Resp 14   SpO2 92%   Physical Exam Vitals reviewed.  Constitutional:      Appearance: He is well-developed and well-nourished.  HENT:     Head: Normocephalic and atraumatic.  Eyes:     Extraocular Movements: Extraocular movements intact and EOM normal.     Pupils: Pupils are equal, round, and reactive to light.  Neck:     Vascular: No JVD.     Comments: No midline c-spine tenderness, pt able to turn head to 45 degrees bilaterally without any pain and able to flex neck to the chest and extend without any pain or neurologic symptoms.  Cardiovascular:     Rate and Rhythm: Normal rate and regular rhythm.  Pulmonary:     Effort: Pulmonary effort is normal. No respiratory distress.     Breath sounds: Normal breath sounds.  Musculoskeletal:        General: No swelling or tenderness.     Cervical back: Normal range of motion and neck supple.  Skin:    General: Skin is warm and dry.  Neurological:     Mental Status: He is alert and oriented to person, place, and time.     Cranial Nerves: No cranial nerve deficit.     Sensory: Sensory deficit present.     Motor: Weakness present.     Coordination: Coordination normal.     Comments: Left upper extremity strength is 1 out of 5. Patient has subjective numbness over the left side of his face, left upper extremity in its entirety.      ED Course/Procedures     Procedures  MDM   Around 10:05 AM I was informed by the nursing staff that patient was complaining of left-sided upper extremity weakness and numbness.  I assessed the patient immediately.  He had come in with intentional overdose with SI.  Patient admits to intermittent crack use.  He reports that prior to overdose he had no weakness or numbness.  After his rescue, he noted both left upper extremity weakness and numbness.  He is unsure how long he was unresponsive for.  He denies any headache, neck  pain.  Patient is having "locking up" type discomfort in his lower extremities.  Patient has been in the ER since 3 AM, so he is last known normal at least more than 7 hours.  We will get CT angiogram head and neck.  I will consult teleneuro. Differential diagnosis includes stroke, nerve palsy in the left upper extremity.  Given the facial numbness, however we will have to entertain stroke in the differential.    Derwood Kaplan, MD 10/31/20 1124  2:02 PM MRI clean. Discussed case with Dr. Roda Shutters, neurology. He has reviewed the CT angiogram, MRI.  He recommends that patient get outpatient neurology follow-up for possible EEG and PT OT.  The patient appears reasonably screened and/or stabilized for discharge and I doubt any other medical condition or other [facility] requiring further screening, evaluation, or treatment in the ED at this time prior to discharge.   Results from the ER workup discussed with the patient face to face and all questions answered to the best of my ability. The patient is safe for discharge with strict return precautions.   Derwood Kaplan, MD 10/31/20 1404    Derwood Kaplan, MD 10/31/20 1406

## 2020-10-31 NOTE — Consult Note (Signed)
[facility]   Reason for Consult:   Referring Physician:  [facility] Patient Identification: Bryan StandsChristopher Harper MRN:  [id number] Principal Diagnosis: Substance induced mood disorder (HCC) Diagnosis:  Principal Problem:   Substance induced mood disorder (HCC) Active Problems:   Polysubstance abuse (HCC)   Total Time spent with patient: 30 minutes  Subjective:   Bryan StandsChristopher Harper is a 30 y.o. male [facility] after presenting with complaints of suicidal ideation and polysubstance abuse.  HPI:  Bryan StandsChristopher Harper, 30 y.o., male patient seen face to face by this provider, consulted with Dr. Lucianne MussKumar; and chart reviewed on 10/31/20.  On evaluation Bryan StandsChristopher Harper reports he relapsed on drugs and alcohol and started having suicidal thoughts.  Patient also states he is homeless and unable to contract for safety.  Patient states that he is not currently thanking any medications and has no outpatient psychiatric services.   During evaluation Bryan StandsChristopher Harper is sitting up in bed no acute distress.  He is alert, oriented x 4, calm and cooperative.  His mood is depressed with congruent affect.  He does not appear to be responding to internal/external stimuli or delusional thoughts.  Patient denies suicidal/self-harm/homicidal ideation, psychosis, and paranoia.  Patient answered question appropriately.  Past Psychiatric History: see above   Past Medical History:  Past Medical History:  Diagnosis Date  . Bipolar 1 disorder (HCC)   . ETOH abuse   . Polysubstance abuse (HCC)    History reviewed. No pertinent surgical history. Family History: History reviewed. No pertinent family history. Family Psychiatric  History: None reported Social History:  Social History   Substance and Sexual Activity  Alcohol Use Yes   Comment: 2 hours ago     Social History   Substance and Sexual Activity  Drug Use Yes  . Types: Cocaine, IV   Comment: 2 hours ago    Social  History   Socioeconomic History  . Marital status: Single    Spouse name: Not on file  . Number of children: Not on file  . Years of education: Not on file  . Highest education level: Not on file  Occupational History  . Not on file  Tobacco Use  . Smoking status: Current Every Day Smoker  . Smokeless tobacco: Never Used  Substance and Sexual Activity  . Alcohol use: Yes    Comment: 2 hours ago  . Drug use: Yes    Types: Cocaine, IV    Comment: 2 hours ago  . Sexual activity: Not on file  Other Topics Concern  . Not on file  Social History Narrative  . Not on file   Social Determinants of Health   Financial Resource Strain: Not on file  Food Insecurity: Not on file  Transportation Needs: Not on file  Physical Activity: Not on file  Stress: Not on file  Social Connections: Not on file   Additional Social History:    Allergies:  No Known Allergies  Labs:  Results for orders placed or performed during the hospital encounter of 10/31/20 (from the past 48 hour(s))  Comprehensive metabolic panel     Status: Abnormal   Collection Time: 10/31/20  3:37 AM  Result Value Ref Range   Sodium 140 135 - 145 mmol/L   Potassium 3.8 3.5 - 5.1 mmol/L   Chloride 102 98 - 111 mmol/L   CO2 24 22 - 32 mmol/L   Glucose, Bld 117 (H) 70 - 99 mg/dL    Comment: Glucose reference range  applies only to samples taken after fasting for at least 8 hours.   BUN 10 6 - 20 mg/dL   Creatinine, Ser 1.54 0.61 - 1.24 mg/dL   Calcium 8.7 (L) 8.9 - 10.3 mg/dL   Total Protein 7.2 6.5 - 8.1 g/dL   Albumin 4.6 3.5 - 5.0 g/dL   AST 008 (H) 15 - 41 U/L   ALT 94 (H) 0 - 44 U/L   Alkaline Phosphatase 60 38 - 126 U/L   Total Bilirubin 0.5 0.3 - 1.2 mg/dL   GFR, Estimated >67 >61 mL/min    Comment: (NOTE) Calculated using the CKD-EPI Creatinine Equation (2021)    Anion gap 14 5 - 15    Comment: Performed at [facility], [address], [place], [place] [address]  Acetaminophen level      Status: Abnormal   Collection Time: 10/31/20  3:37 AM  Result Value Ref Range   Acetaminophen (Tylenol), Serum <10 (L) 10 - 30 ug/mL    Comment: (NOTE) Therapeutic concentrations vary significantly. A range of 10-30 ug/mL  may be an effective concentration for many patients. However, some  are best treated at concentrations outside of this range. Acetaminophen concentrations >150 ug/mL at 4 hours after ingestion  and >50 ug/mL at 12 hours after ingestion are often associated with  toxic reactions.  Performed at [facility], [address] [place], [place] [address]   Salicylate level     Status: Abnormal   Collection Time: 10/31/20  3:37 AM  Result Value Ref Range   Salicylate Lvl <7.0 (L) 7.0 - 30.0 mg/dL    Comment: Performed at [facility], [address], [place], [place] [address]  CBC with Differential     Status: Abnormal   Collection Time: 10/31/20  3:37 AM  Result Value Ref Range   WBC 14.2 (H) 4.0 - 10.5 K/uL   RBC 4.21 (L) 4.22 - 5.81 MIL/uL   Hemoglobin 13.8 13.0 - 17.0 g/dL   HCT 98.3 38.2 - 50.5 %   MCV 96.9 80.0 - 100.0 fL   MCH 32.8 26.0 - 34.0 pg   MCHC 33.8 30.0 - 36.0 g/dL   RDW 39.7 67.3 - 41.9 %   Platelets 220 150 - 400 K/uL   nRBC 0.0 0.0 - 0.2 %   Neutrophils Relative % 91 %   Neutro Abs 13.0 (H) 1.7 - 7.7 K/uL   Lymphocytes Relative 5 %   Lymphs Abs 0.7 0.7 - 4.0 K/uL   Monocytes Relative 4 %   Monocytes Absolute 0.5 0.1 - 1.0 K/uL   Eosinophils Relative 0 %   Eosinophils Absolute 0.0 0.0 - 0.5 K/uL   Basophils Relative 0 %   Basophils Absolute 0.0 0.0 - 0.1 K/uL   Immature Granulocytes 0 %   Abs Immature Granulocytes 0.03 0.00 - 0.07 K/uL    Comment: Performed at [facility], [address] [place], [place] [address]  Ethanol     Status: Abnormal   Collection Time: 10/31/20  3:37 AM  Result Value Ref Range   Alcohol, Ethyl (B) 104 (H) <10 mg/dL    Comment: (NOTE) Lowest  detectable limit for serum alcohol is 10 mg/dL.  For medical purposes only. Performed at [facility], [address], [place], [place] [address]   Urine rapid drug screen (hosp performed)     Status: None   Collection Time: 10/31/20  6:30 AM  Result Value Ref Range  Opiates NONE DETECTED NONE DETECTED   Cocaine NONE DETECTED NONE DETECTED   Benzodiazepines NONE DETECTED NONE DETECTED   Amphetamines NONE DETECTED NONE DETECTED   Tetrahydrocannabinol NONE DETECTED NONE DETECTED   Barbiturates NONE DETECTED NONE DETECTED    Comment: (NOTE) DRUG SCREEN FOR MEDICAL PURPOSES ONLY.  IF CONFIRMATION IS NEEDED FOR ANY PURPOSE, NOTIFY LAB WITHIN 5 DAYS.  LOWEST DETECTABLE LIMITS FOR URINE DRUG SCREEN Drug Class                     Cutoff (ng/mL) Amphetamine and metabolites    1000 Barbiturate and metabolites    200 Benzodiazepine                 200 Tricyclics and metabolites     300 Opiates and metabolites        300 Cocaine and metabolites        300 THC                            50 Performed at [facility], [address], [place], [place] [address]   Resp Panel by RT-PCR (Flu A&B, Covid) Nasopharyngeal Swab     Status: None   Collection Time: 10/31/20  7:59 AM   Specimen: Nasopharyngeal Swab; Nasopharyngeal(NP) swabs in vial transport medium  Result Value Ref Range   SARS Coronavirus 2 by RT PCR NEGATIVE NEGATIVE    Comment: (NOTE) SARS-CoV-2 target nucleic acids are NOT DETECTED.  The SARS-CoV-2 RNA is generally detectable in upper respiratory specimens during the acute phase of infection. The lowest concentration of SARS-CoV-2 viral copies this assay can detect is 138 copies/mL. A negative result does not preclude SARS-Cov-2 infection and should not be used as the sole basis for treatment or other patient management decisions. A negative result may occur with  improper specimen collection/handling, submission of specimen  other than nasopharyngeal swab, presence of viral mutation(s) within the areas targeted by this assay, and inadequate number of viral copies(<138 copies/mL). A negative result must be combined with clinical observations, patient history, and epidemiological information. The expected result is Negative.  Fact Sheet for Patients:  BloggerCourse.com  Fact Sheet for Healthcare Providers:  SeriousBroker.it  This test is no t yet approved or cleared by the [place] FDA and  has been authorized for detection and/or diagnosis of SARS-CoV-2 by FDA under an Emergency Use Authorization (EUA). This EUA will remain  in effect (meaning this test can be used) for the duration of the COVID-19 declaration under Section 564(b)(1) of the Act, 21 U.S.C.section 360bbb-3(b)(1), unless the authorization is terminated  or revoked sooner.       Influenza A by PCR NEGATIVE NEGATIVE   Influenza B by PCR NEGATIVE NEGATIVE    Comment: (NOTE) The Xpert Xpress SARS-CoV-2/FLU/RSV plus assay is intended as an aid in the diagnosis of influenza from Nasopharyngeal swab specimens and should not be used as a sole basis for treatment. Nasal washings and aspirates are unacceptable for Xpert Xpress SARS-CoV-2/FLU/RSV testing.  Fact Sheet for Patients: BloggerCourse.com  Fact Sheet for Healthcare Providers: SeriousBroker.it  This test is not yet approved or cleared by the [place] FDA and has been authorized for detection and/or diagnosis of SARS-CoV-2 by FDA under an Emergency Use Authorization (EUA). This EUA will remain in effect (meaning this test can be used) for the duration of the COVID-19 declaration under Section 564(b)(1) of the Act, 21 U.S.C. section  360bbb-3(b)(1), unless the authorization is terminated or revoked.  Performed at [facility], [address], [place], [place] [address]   CBG monitoring, ED     Status: None   Collection Time: 10/31/20 12:15 PM  Result Value Ref Range   Glucose-Capillary 89 70 - 99 mg/dL    Comment: Glucose reference range applies only to samples taken after fasting for at least 8 hours.    Current Facility-Administered Medications  Medication Dose Route Frequency Provider Last Rate Last Admin  . acetaminophen (TYLENOL) tablet 650 mg  650 mg Oral Q4H PRN Dione Booze, MD      . alum & mag hydroxide-simeth (MAALOX/MYLANTA) 200-200-20 MG/5ML suspension 30 mL  30 mL Oral Q6H PRN Dione Booze, MD      . FLUoxetine (PROZAC) capsule 20 mg  20 mg Oral Daily Rankin, Shuvon B, NP   20 mg at 10/31/20 1342  . gabapentin (NEURONTIN) capsule 200 mg  200 mg Oral TID Rankin, Shuvon B, NP   200 mg at 10/31/20 1342  . nicotine (NICODERM CQ - dosed in mg/24 hours) patch 14 mg  14 mg Transdermal Once Dione Booze, MD      . ondansetron [facility]) tablet 4 mg  4 mg Oral Q8H PRN Dione Booze, MD       No current outpatient medications on file.    Musculoskeletal: Strength & Muscle Tone: within normal limits Gait & Station: normal Patient leans: N/A  Psychiatric Specialty Exam: Physical Exam Vitals and nursing note reviewed. Chaperone present: Sitter at bedside.  Constitutional:      General: He is not in acute distress.    Appearance: Normal appearance. He is not ill-appearing.  HENT:     Head: Normocephalic.  Eyes:     Pupils: Pupils are equal, round, and reactive to light.  Cardiovascular:     Rate and Rhythm: Normal rate.  Pulmonary:     Effort: Pulmonary effort is normal.  Musculoskeletal:        General: Normal range of motion.     Cervical back: Normal range of motion.  Skin:    General: Skin is warm and dry.  Neurological:     Mental Status: He is alert and oriented to person, place, and time.  Psychiatric:        Attention and Perception: Attention and perception normal.        Mood and Affect: Mood is  anxious.        Behavior: Behavior normal. Behavior is cooperative.        Thought Content: Thought content is not paranoid or delusional. Thought content includes suicidal ideation. Thought content does not include homicidal ideation.        Cognition and Memory: Cognition and memory normal.        Judgment: Judgment is impulsive.     Review of Systems  Constitutional: Negative.   HENT: Negative.   Eyes: Negative.   Respiratory: Negative.   Cardiovascular: Negative.   Gastrointestinal: Negative.   Genitourinary: Negative.   Musculoskeletal: Negative.   Skin: Negative.   Neurological: Negative.   Hematological: Negative.   Psychiatric/Behavioral: Positive for suicidal ideas. The patient is nervous/anxious.     Blood pressure 125/90, pulse 77, temperature 98.6 F (37 C), temperature source Oral, resp. rate 18, SpO2 97 %.There is no height or weight on file to calculate BMI.  General Appearance: Casual  Eye Contact:  Good  Speech:  Clear and Coherent and Normal Rate  Volume:  Normal  Mood:  Anxious and Depressed  Affect:  Congruent  Thought Process:  Coherent, Goal Directed and Descriptions of Associations: Intact  Orientation:  Full (Time, Place, and Person)  Thought Content:  WDL  Suicidal Thoughts:  Yes.  without intent/plan  Homicidal Thoughts:  No  Memory:  Immediate;   Good Recent;   Good  Judgement:  Fair  Insight:  Fair  Psychomotor Activity:  Tremor  Concentration:  Concentration: Good and Attention Span: Good  Recall:  Good  Fund of Knowledge:  Fair  Language:  Good  Akathisia:  No  Handed:  Right  AIMS (if indicated):     Assets:  Communication Skills Desire for Improvement  ADL's:  Intact  Cognition:  WNL  Sleep:        Treatment Plan Summary: Plan Continuous Assessment at [facility] for overnight observation for safety and stabilization  Disposition: Continuous assessment (for safety and stabilization) [facility]  Shuvon Rankin, NP 10/31/2020 3:17 PM

## 2020-10-31 NOTE — Discharge Instructions (Addendum)
Transfer to [facility] 

## 2020-10-31 NOTE — ED Provider Notes (Signed)
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 - 30.0 mg/dL Final   Performed at [facility], [address], [place], [place] [address]  .  Acetaminophen (Tylenol), Serum 06/16/2020 <10* 10 - 30 ug/mL Final   Comment: (NOTE) Therapeutic concentrations vary significantly. A range of 10-30 ug/mL  may be an effective concentration for many patients. However, some  are best treated at concentrations outside of this range. Acetaminophen concentrations >150 ug/mL at 4 hours after ingestion  and >50 ug/mL at 12 hours after ingestion are often associated with  toxic reactions.  Performed at [facility], [address], [place], [place] [address]   . WBC 06/16/2020 7.3  4.0 - 10.5 K/uL Final  . RBC 06/16/2020 4.51  4.22 - 5.81 MIL/uL Final  . Hemoglobin 06/16/2020 15.2  13.0 - 17.0 g/dL Final  . HCT 32/44/0102 44.6  39.0 - 52.0 % Final  . MCV 06/16/2020 98.9  80.0 - 100.0 fL Final  . MCH 06/16/2020 33.7  26.0 - 34.0 pg Final  . MCHC 06/16/2020 34.1  30.0 - 36.0 g/dL Final  . RDW 72/53/6644 13.7  11.5 - 15.5 % Final  . Platelets 06/16/2020 270  150 - 400 K/uL Final  . nRBC 06/16/2020 0.0  0.0 - 0.2 % Final   Performed at [facility], [address] [place], [place] [address]  .  Opiates 06/16/2020 NONE DETECTED  NONE DETECTED Final  . Cocaine 06/16/2020 POSITIVE* NONE DETECTED Final  . Benzodiazepines 06/16/2020 NONE DETECTED  NONE DETECTED Final  . Amphetamines 06/16/2020 NONE DETECTED  NONE DETECTED Final  . Tetrahydrocannabinol 06/16/2020 NONE DETECTED  NONE DETECTED Final  . Barbiturates 06/16/2020 NONE DETECTED  NONE DETECTED Final   Comment: (NOTE) DRUG SCREEN FOR MEDICAL PURPOSES ONLY.  IF CONFIRMATION IS NEEDED FOR ANY PURPOSE, NOTIFY LAB WITHIN 5 DAYS.  LOWEST DETECTABLE LIMITS FOR URINE DRUG SCREEN Drug Class                     Cutoff (ng/mL) Amphetamine and metabolites    1000 Barbiturate and metabolites    200 Benzodiazepine                 200 Tricyclics and metabolites     300 Opiates and metabolites        300 Cocaine and metabolites        300 THC                             50 Performed at [facility], [address], [place], [place] [address]   . SARS Coronavirus 2 by RT PCR 06/16/2020 NEGATIVE  NEGATIVE Final   Comment: (NOTE) SARS-CoV-2 target nucleic acids are NOT DETECTED.  The SARS-CoV-2 RNA is generally detectable in upper respiratoy specimens during the acute phase of infection. The lowest concentration of SARS-CoV-2 viral copies this assay can detect is 131 copies/mL. A negative result does not preclude SARS-Cov-2 infection and should not be used as the sole basis for treatment or other patient management decisions. A negative result may occur with  improper specimen collection/handling, submission of specimen other than nasopharyngeal swab, presence of viral mutation(s) within the areas targeted by this assay, and inadequate number of viral copies (<131 copies/mL). A negative result must be combined with clinical observations, patient history, and epidemiological information. The expected result is Negative.  Fact Sheet for Patients:  https://www.moore.com/  Fact Sheet for Healthcare Providers:  https://www.young.biz/  This test is no                          t yet approved or cleared by the [place] FDA and  has been authorized for detection and/or diagnosis of SARS-CoV-2 by FDA under an Emergency Use Authorization (EUA). This EUA will remain  in effect (meaning this test can be used) for the duration of the COVID-19 declaration under Section 564(b)(1) of the Act, 21 U.S.C. section 360bbb-3(b)(1), unless the authorization is terminated or revoked sooner.    . Influenza A by PCR 06/16/2020 NEGATIVE  NEGATIVE Final  . Influenza B by PCR 06/16/2020 NEGATIVE  NEGATIVE Final   Comment: (NOTE) The Xpert Xpress SARS-CoV-2/FLU/RSV assay is intended as an aid in  the diagnosis of influenza from Nasopharyngeal swab specimens and  should not be used as a sole basis for  treatment. Nasal washings and  aspirates are unacceptable for Xpert Xpress SARS-CoV-2/FLU/RSV  testing.  Fact Sheet for Patients: https://www.moore.com/  Fact Sheet for Healthcare Providers: https://www.young.biz/  This test is not yet approved or cleared by the [place] FDA and  has been authorized for detection and/or diagnosis of SARS-CoV-2 by  FDA under an Emergency Use Authorization (EUA). This EUA will remain  in effect (meaning this test can be used) for the duration  of the  Covid-19 declaration under Section 564(b)(1) of the Act, 21  U.S.C. section 360bbb-3(b)(1), unless the authorization is  terminated or revoked. Performed at [facility], [address], [place], [place] [address]   Admission on 06/13/2020, Discharged on 06/14/2020  Component Date Value Ref Range Status  . Sodium 06/13/2020 141  135 - 145 mmol/L Final  . Potassium 06/13/2020 4.1  3.5 - 5.1 mmol/L Final  . Chloride 06/13/2020 102  98 - 111 mmol/L Final  . CO2 06/13/2020 27  22 - 32 mmol/L Final  . Glucose, Bld 06/13/2020 67* 70 - 99 mg/dL Final   Glucose reference range applies only to samples taken after fasting for at least 8 hours.  . BUN 06/13/2020 12  6 - 20 mg/dL Final  . Creatinine, Ser 06/13/2020 0.92  0.61 - 1.24 mg/dL Final  . Calcium 60/45/4098 9.3  8.9 - 10.3 mg/dL Final  . Total Protein 06/13/2020 7.4  6.5 - 8.1 g/dL Final  . Albumin 11/91/4782 4.7  3.5 - 5.0 g/dL Final  . AST 95/62/1308 32  15 - 41 U/L Final  . ALT 06/13/2020 34  0 - 44 U/L Final  . Alkaline Phosphatase 06/13/2020 47  38 - 126 U/L Final  . Total Bilirubin 06/13/2020 0.6  0.3 - 1.2 mg/dL Final  . GFR, Estimated 06/13/2020 >60  >60 mL/min Final  . Anion gap 06/13/2020 12  5 - 15 Final   Performed at [facility], [address], [place], [place] [address]  . Alcohol, Ethyl (B) 06/13/2020 11* <10 mg/dL Final   Comment: (NOTE) Lowest detectable limit for  serum alcohol is 10 mg/dL.  For medical purposes only. Performed at [facility], [address], [place], [place] [address]   . Salicylate Lvl 06/13/2020 <7.0* 7.0 - 30.0 mg/dL Final   Performed at [facility], [address] [place], [place] [address]  . Acetaminophen (Tylenol), Serum 06/13/2020 <10* 10 - 30 ug/mL Final   Comment: (NOTE) Therapeutic concentrations vary significantly. A range of 10-30 ug/mL  may be an effective concentration for many patients. However, some  are best treated at concentrations outside of this range. Acetaminophen concentrations >150 ug/mL at 4 hours after ingestion  and >50 ug/mL at 12 hours after ingestion are often associated with  toxic reactions.  Performed at [facility], [address], [place], [place] [address]   . WBC 06/13/2020 9.7  4.0 - 10.5 K/uL Final  . RBC 06/13/2020 4.72  4.22 - 5.81 MIL/uL Final  . Hemoglobin 06/13/2020 15.4  13.0 - 17.0 g/dL Final  . HCT 06/22/2535 46.5  39.0 - 52.0 % Final  . MCV 06/13/2020 98.5  80.0 - 100.0 fL Final  . MCH 06/13/2020 32.6  26.0 - 34.0 pg Final  . MCHC 06/13/2020 33.1  30.0 - 36.0 g/dL Final  . RDW 64/40/3474 13.7  11.5 - 15.5 % Final  . Platelets 06/13/2020 334  150 - 400 K/uL Final  . nRBC 06/13/2020 0.0  0.0 - 0.2 % Final   Performed at [facility], [address], [place], [place] [address]  . Opiates 06/13/2020 NONE DETECTED  NONE DETECTED Final  . Cocaine 06/13/2020 POSITIVE* NONE DETECTED Final  . Benzodiazepines 06/13/2020 NONE DETECTED  NONE DETECTED Final  . Amphetamines 06/13/2020 NONE DETECTED  NONE DETECTED Final  . Tetrahydrocannabinol 06/13/2020 NONE DETECTED  NONE DETECTED Final  . Barbiturates 06/13/2020 NONE DETECTED  NONE DETECTED  Final   Comment: (NOTE) DRUG SCREEN FOR MEDICAL PURPOSES ONLY.  IF CONFIRMATION IS NEEDED FOR ANY PURPOSE, NOTIFY LAB WITHIN 5 DAYS.  LOWEST DETECTABLE LIMITS FOR URINE DRUG SCREEN Drug Class                      Cutoff (ng/mL) Amphetamine and metabolites    1000 Barbiturate and metabolites    200 Benzodiazepine                 200 Tricyclics and metabolites     300 Opiates and metabolites        300 Cocaine and metabolites        300 THC                            50 Performed at [facility], [address], [place], [place] [address]   . SARS Coronavirus 2 by RT PCR 06/13/2020 NEGATIVE  NEGATIVE Final   Comment: (NOTE) SARS-CoV-2 target nucleic acids are NOT DETECTED.  The SARS-CoV-2 RNA is generally detectable in upper respiratoy specimens during the acute phase of infection. The lowest concentration of SARS-CoV-2 viral copies this assay can detect is 131 copies/mL. A negative result does not preclude SARS-Cov-2 infection and should not be used as the sole basis for treatment or other patient management decisions. A negative result may occur with  improper specimen collection/handling, submission of specimen other than nasopharyngeal swab, presence of viral mutation(s) within the areas targeted by this assay, and inadequate number of viral copies (<131 copies/mL). A negative result must be combined with clinical observations, patient history, and epidemiological information. The expected result is Negative.  Fact Sheet for Patients:  https://www.moore.com/  Fact Sheet for Healthcare Providers:  https://www.young.biz/  This test is no                          t yet approved or cleared by the [place] FDA and  has been authorized for detection and/or diagnosis of SARS-CoV-2 by FDA under an Emergency Use Authorization (EUA). This EUA will remain  in effect (meaning this test can be used) for the duration of the COVID-19 declaration under Section 564(b)(1) of the Act, 21 U.S.C. section 360bbb-3(b)(1), unless the authorization is terminated or revoked sooner.    . Influenza A by PCR 06/13/2020 NEGATIVE  NEGATIVE Final  . Influenza B  by PCR 06/13/2020 NEGATIVE  NEGATIVE Final   Comment: (NOTE) The Xpert Xpress SARS-CoV-2/FLU/RSV assay is intended as an aid in  the diagnosis of influenza from Nasopharyngeal swab specimens and  should not be used as a sole basis for treatment. Nasal washings and  aspirates are unacceptable for Xpert Xpress SARS-CoV-2/FLU/RSV  testing.  Fact Sheet for Patients: https://www.moore.com/  Fact Sheet for Healthcare Providers: https://www.young.biz/  This test is not yet approved or cleared by the [place] FDA and  has been authorized for detection and/or diagnosis of SARS-CoV-2 by  FDA under an Emergency Use Authorization (EUA). This EUA will remain  in effect (meaning this test can be used) for the duration of the  Covid-19 declaration under Section 564(b)(1) of the Act, 21  U.S.C. section 360bbb-3(b)(1), unless the authorization is  terminated or revoked. Performed at [facility], [address] [place], [place] [address]   . Glucose-Capillary 06/13/2020 138* 70 - 99 mg/dL Final   Glucose reference range applies only  to samples taken after fasting for at least 8 hours.  . Comment 1 06/13/2020 Document in Chart   Final    Allergies: Patient has no known allergies.  PTA Medications: (Not in a hospital admission)   Medical Decision Making  Patient admitted to Continuous Assessment Unit for safety and stabilization while awaiting appropriate bed for psychiatric hospitalization  Routine LABS reviewed elevated AST/ALT (see abnormal levels above)  Ordered EKG 10/31/20 Normal Sinus Rhythm QTc 435  MR Head/Cervical spine 10/31/20 IMPRESSION: 1. No evidence of acute intracranial abnormality.  No acute infarct. 2. 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  CT Angiography Head/Neck 10/31/20 IMPRESSION: Normal  examination. Normal CT angiography of the neck and head. No evidence of atherosclerotic disease. No large or medium vessel occlusion. No aneurysm or vascular malformation.  Medication Management:  Meds ordered this encounter  Medications  . acetaminophen (TYLENOL) tablet 650 mg  . alum & mag hydroxide-simeth (MAALOX/MYLANTA) 200-200-20 MG/5ML suspension 30 mL  . magnesium hydroxide (MILK OF MAGNESIA) suspension 30 mL  . FLUoxetine (PROZAC) capsule 20 mg  . gabapentin (NEURONTIN) capsule 200 mg   Recommendations  Based on my evaluation the patient does not appear to have an emergency medical condition. Will continue to monitor complaints of weakness and chest pain.  If no improvement or worsens will sned back to the ED  Bryan Febo, NP 10/31/20  6:21 PM

## 2020-10-31 NOTE — ED Notes (Signed)
Security called to wand the pt.

## 2020-10-31 NOTE — BH Assessment (Signed)
[facility] Assessment Progress Note  Per Nelly Rout, MD, pt is to be transferred to the [facility].  Please call report to [phone].  Pt is to be transported via [employer].  EDP Derwood Kaplan, MD and pt's nurse, Abby, have been notified.  Doylene Canning, [place] [facility] Coordinator [phone]

## 2020-10-31 NOTE — ED Notes (Signed)
Report called to RN Linton Rump, [facility] rm [phone], Pending Safe Transport.

## 2020-10-31 NOTE — ED Notes (Signed)
Patient was given his lunch tray.

## 2020-10-31 NOTE — Consult Note (Signed)
[facility] Telemedicine Consult   Requesting Provider: Dr. Rhunette Croft  Chief Complaint: left arm weakness  HPI:  30 year old male with history of bipolar disorder was on Tegretol but now off, alcohol abuse, substance abuse admitted to hospital after fentanyl overdose.  Patient stated that he drank last night and also with fentanyl.  He fell while he was walking on the street.  EMS called, patient received Narcan at 3 AM, patient regained consciousness.  However, patient was found to have left arm weakness and left facial numbness.  He was sent to ER for evaluation.  In ER, his left facial numbness gradually improved, however still has left arm weakness, not able to move.  However, he denies any vision, speech difficulty, no leg weakness.  Complained of mild soreness between the left shoulder and left neck.  BP 141/82.  CT head negative.  CT head and neck no LVO.  MRI brain no infarct, MRI C-spine no spinal cord compression.  Neurology was called for further evaluation.   LKW: Unclear, wake up after Narcan at 3 AM with left arm weakness. tpa given?: No, unclear time onset, outside window IR Thrombectomy? No, no LVO Modified Rankin Scale: 0-Completely asymptomatic and back to baseline post- stroke   Exam: Vitals:   10/31/20 1549 10/31/20 1600  BP: 128/81 128/83  Pulse: 80 86  Resp: 16 17  Temp: 98.7 F (37.1 C)   SpO2: 99% 97%     Temp:  [97.9 F (36.6 C)-98.7 F (37.1 C)] 98.7 F (37.1 C) (03/08 1549) Pulse Rate:  [77-100] 86 (03/08 1600) Resp:  [12-19] 17 (03/08 1600) BP: (125-152)/(81-105) 128/83 (03/08 1600) SpO2:  [92 %-99 %] 97 % (03/08 1600)  General - Well nourished, well developed, in no apparent distress.  Ophthalmologic - fundi not visualized due to noncooperation.  Cardiovascular - Regular rhythm and rate.  Neuro -patient awake alert, fully orientated, no aphasia, follows simple commands, able to name and repeat.  No gaze palsy, visual field full.   Facial symmetrical, facial sensation also symmetrical.  Tongue midline.  Left upper extremity deltoid, bicep and tricep 0/5, however he is able to have flexion and extension left wrist at least 3/5, able to have finger grip at least 3/5.  Right arm, bilateral lower extremity normal strength.  Decreased left upper extremity sensation.  Finger-to-nose on the right hand and heel-to-shin bilateral lower extremity intact.  DTR and details sensation difficult exam during televisit.  Gait not tested   NIH Stroke Scale  Level Of Consciousness 0=Alert; keenly responsive 1=Arouse to minor stimulation 2=Requires repeated stimulation to arouse or movements to pain 3=postures or unresponsive 0  LOC Questions to Month and Age 43=Answers both questions correctly 1=Answers one question correctly or dysarthria/intubated/trauma/language barrier 2=Answers neither question correctly or aphasia 0  LOC Commands      -Open/Close eyes     -Open/close grip     -Pantomime commands if communication barrier 0=Performs both tasks correctly 1=Performs one task correctly 2=Performs neighter task correctly 0  Best Gaze     -Only assess horizontal gaze 0=Normal 1=Partial gaze palsy 2=Forced deviation, or total gaze paresis 0  Visual 0=No visual loss 1=Partial hemianopia 2=Complete hemianopia 3=Bilateral hemianopia (blind including cortical blindness) 0  Facial Palsy     -Use grimace if obtunded 0=Normal symmetrical movement 1=Minor paralysis (asymmetry) 2=Partial paralysis (lower face) 3=Complete paralysis (upper and lower face) 0  Motor  0=No drift for 10/5 seconds 1=Drift, but does not hit bed 2=Some antigravity effort, hits  bed  3=No effort against gravity, limb falls 4=No movement 0=Amputation/joint fusion Right Arm 0     Leg 0    Left Arm 3     Leg 0  Limb Ataxia     - FNT/HTS 0=Absent or does not understand or paralyzed or amputation/joint fusion 1=Present in one limb 2=Present in two limbs 0   Sensory 0=Normal 1=Mild to moderate sensory loss 2=Severe to total sensory loss or coma/unresponsive 1  Best Language 0=No aphasia, normal 1=Mild to moderate aphasia 2=Severe aphasia 3=Mute, global aphasia, or coma/unresponsive 0  Dysarthria 0=Normal 1=Mild to moderate 2=Severe, unintelligible or mute/anarthric 0=intubated/unable to test 0  Extinction/Neglect 0=No abnormality 1=visual/tactile/auditory/spatia/personal inattention/Extinction to bilateral simultaneous stimulation 2=Profound neglect/extinction more than 1 modality  0  Total   4      Imaging Reviewed:  CT Angio Head W or Wo Contrast  Result Date: 10/31/2020 CLINICAL DATA:  Bilateral upper extremity contraction. EXAM: CT ANGIOGRAPHY HEAD AND NECK TECHNIQUE: Multidetector CT imaging of the head and neck was performed using the standard protocol during bolus administration of intravenous contrast. Multiplanar CT image reconstructions and MIPs were obtained to evaluate the vascular anatomy. Carotid stenosis measurements (when applicable) are obtained utilizing NASCET criteria, using the distal internal carotid diameter as the denominator. CONTRAST:  78mL OMNIPAQUE IOHEXOL 350 MG/ML SOLN COMPARISON:  None. FINDINGS: CT HEAD FINDINGS Brain: The brain shows a normal appearance without evidence of malformation, atrophy, old or acute small or large vessel infarction, mass lesion, hemorrhage, hydrocephalus or extra-axial collection. Vascular: No hyperdense vessel. No evidence of atherosclerotic calcification. Skull: Normal.  No traumatic finding.  No focal bone lesion. Sinuses/Orbits: Sinuses are clear. Orbits appear normal. Mastoids are clear. Other: None significant CTA NECK FINDINGS Aortic arch: Normal Right carotid system: Normal. No atherosclerotic disease or dissection. Left carotid system: Normal. No atherosclerotic disease or dissection. Vertebral arteries: Normal Skeleton: Normal Other neck: No mass or adenopathy. Upper chest:  Normal Review of the MIP images confirms the above findings CTA HEAD FINDINGS Anterior circulation: Both internal carotid arteries widely patent through the skull base and siphon regions. The anterior and middle cerebral vessels are patent. No large or medium vessel occlusion. No aneurysm or vascular malformation. Posterior circulation: Both vertebral arteries are patent through the foramen magnum to the basilar. No basilar stenosis. Posterior circulation branch vessels are normal. Venous sinuses: Patent and normal. Anatomic variants: None Review of the MIP images confirms the above findings IMPRESSION: Normal examination. Normal CT angiography of the neck and head. No evidence of atherosclerotic disease. No large or medium vessel occlusion. No aneurysm or vascular malformation. Electronically Signed   By: Paulina Fusi M.D.   On: 10/31/2020 11:00   CT Angio Neck W and/or Wo Contrast  Result Date: 10/31/2020 CLINICAL DATA:  Bilateral upper extremity contraction. EXAM: CT ANGIOGRAPHY HEAD AND NECK TECHNIQUE: Multidetector CT imaging of the head and neck was performed using the standard protocol during bolus administration of intravenous contrast. Multiplanar CT image reconstructions and MIPs were obtained to evaluate the vascular anatomy. Carotid stenosis measurements (when applicable) are obtained utilizing NASCET criteria, using the distal internal carotid diameter as the denominator. CONTRAST:  17mL OMNIPAQUE IOHEXOL 350 MG/ML SOLN COMPARISON:  None. FINDINGS: CT HEAD FINDINGS Brain: The brain shows a normal appearance without evidence of malformation, atrophy, old or acute small or large vessel infarction, mass lesion, hemorrhage, hydrocephalus or extra-axial collection. Vascular: No hyperdense vessel. No evidence of atherosclerotic calcification. Skull: Normal.  No traumatic finding.  No focal bone lesion. Sinuses/Orbits: Sinuses  are clear. Orbits appear normal. Mastoids are clear. Other: None significant CTA  NECK FINDINGS Aortic arch: Normal Right carotid system: Normal. No atherosclerotic disease or dissection. Left carotid system: Normal. No atherosclerotic disease or dissection. Vertebral arteries: Normal Skeleton: Normal Other neck: No mass or adenopathy. Upper chest: Normal Review of the MIP images confirms the above findings CTA HEAD FINDINGS Anterior circulation: Both internal carotid arteries widely patent through the skull base and siphon regions. The anterior and middle cerebral vessels are patent. No large or medium vessel occlusion. No aneurysm or vascular malformation. Posterior circulation: Both vertebral arteries are patent through the foramen magnum to the basilar. No basilar stenosis. Posterior circulation branch vessels are normal. Venous sinuses: Patent and normal. Anatomic variants: None Review of the MIP images confirms the above findings IMPRESSION: Normal examination. Normal CT angiography of the neck and head. No evidence of atherosclerotic disease. No large or medium vessel occlusion. No aneurysm or vascular malformation. Electronically Signed   By: Paulina Fusi M.D.   On: 10/31/2020 11:00   MR BRAIN WO CONTRAST  Result Date: 10/31/2020 CLINICAL DATA:  Left-sided weakness. EXAM: MRI HEAD WITHOUT CONTRAST MRI CERVICAL SPINE WITHOUT CONTRAST TECHNIQUE: Multiplanar, multiecho pulse sequences of the brain and surrounding structures, and cervical spine, to include the craniocervical junction and cervicothoracic junction, were obtained without intravenous contrast. COMPARISON:  Same day CT exams FINDINGS: MRI HEAD FINDINGS Brain: No acute infarction, hemorrhage, hydrocephalus, extra-axial collection or mass lesion. There are a few punctate T2/FLAIR hyperintensities within the white matter, within normal limits for age and most likely related to chronic microvascular ischemic disease. Vascular: Major arterial flow voids are maintained at the skull base. Skull: Normal marrow signal. Sinuses/Orbits:  No substantial sinus disease.  Unremarkable orbits. Other: No mastoid effusions. MRI CERVICAL SPINE FINDINGS Alignment: Normal. Vertebrae: Vertebral body heights are maintained. No specific evidence of acute fracture, discitis/osteomyelitis, or suspicious bone lesion. Mild retroflexion of the dens. Cord: Normal cord signal. Posterior Fossa, vertebral arteries, paraspinal tissues: Negative. Disc levels: Short pedicles throughout the cervical spine. C2-C3: No significant disc protrusion, foraminal stenosis, or canal stenosis. C3-C4: Mild facet/uncovertebral hypertrophy without significant canal or foraminal stenosis. C4-C5: Small posterior disc osteophyte complex and mild right facet/uncovertebral hypertrophy with resulting mild right foraminal stenosis. C5-C6: Small posterior disc osteophyte complex and mild right-sided facet/uncovertebral hypertrophy. Mild right foraminal stenosis without significant canal stenosis. C6-C7: No significant disc protrusion, foraminal stenosis, or canal stenosis. C7-T1: No significant disc protrusion, foraminal stenosis, or canal stenosis. IMPRESSION: 1. No evidence of acute intracranial abnormality.  No acute infarct. 2. 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 Electronically Signed   By: Feliberto Harts MD   On: 10/31/2020 13:42   MR Cervical Spine Wo Contrast  Result Date: 10/31/2020 CLINICAL DATA:  Left-sided weakness. EXAM: MRI HEAD WITHOUT CONTRAST MRI CERVICAL SPINE WITHOUT CONTRAST TECHNIQUE: Multiplanar, multiecho pulse sequences of the brain and surrounding structures, and cervical spine, to include the craniocervical junction and cervicothoracic junction, were obtained without intravenous contrast. COMPARISON:  Same day CT exams FINDINGS: MRI HEAD FINDINGS Brain: No acute infarction, hemorrhage, hydrocephalus, extra-axial collection or mass lesion. There  are a few punctate T2/FLAIR hyperintensities within the white matter, within normal limits for age and most likely related to chronic microvascular ischemic disease. Vascular: Major arterial flow voids are maintained at the skull base. Skull: Normal marrow signal. Sinuses/Orbits: No substantial sinus disease.  Unremarkable orbits. Other: No mastoid  effusions. MRI CERVICAL SPINE FINDINGS Alignment: Normal. Vertebrae: Vertebral body heights are maintained. No specific evidence of acute fracture, discitis/osteomyelitis, or suspicious bone lesion. Mild retroflexion of the dens. Cord: Normal cord signal. Posterior Fossa, vertebral arteries, paraspinal tissues: Negative. Disc levels: Short pedicles throughout the cervical spine. C2-C3: No significant disc protrusion, foraminal stenosis, or canal stenosis. C3-C4: Mild facet/uncovertebral hypertrophy without significant canal or foraminal stenosis. C4-C5: Small posterior disc osteophyte complex and mild right facet/uncovertebral hypertrophy with resulting mild right foraminal stenosis. C5-C6: Small posterior disc osteophyte complex and mild right-sided facet/uncovertebral hypertrophy. Mild right foraminal stenosis without significant canal stenosis. C6-C7: No significant disc protrusion, foraminal stenosis, or canal stenosis. C7-T1: No significant disc protrusion, foraminal stenosis, or canal stenosis. IMPRESSION: 1. No evidence of acute intracranial abnormality.  No acute infarct. 2. 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 Electronically Signed   By: Feliberto HartsFrederick S Jones MD   On: 10/31/2020 13:42     Assessment:  30 year old male with history of bipolar disorder was on Tegretol but now off, alcohol abuse, substance abuse admitted to hospital after unconsciousness due to fentanyl overdose.  He received Narcan at 3 AM regained consciousness.  However,  patient was found to have left arm weakness and left facial numbness.  He was sent to ER for evaluation.  In ER, his left facial numbness gradually improved, however still has isolated left arm weakness and decreased sensation, NIHSS = 4.  CT head negative.  CT head and neck no LVO. Patient not TPA candidate given outside window, not IR candidate due to no LVO.  Subsequent MRI showed no infarct and MRI C-spine no spinal cord compression.  Etiology for patient's symptoms not quite clear, examination not a consistent with stroke, also atypical for radiculopathy or peripheral nerve disease.  Questionable conversion disorder, however DDx also including radiculopathy and peripheral nerve disease.  Currently patient will be admitted for psychiatry, recommend further PT/OT in psychiatry unit, and then close follow-up with neurology outpatient ASAP after discharge for NCS and EMG study.   Recommendations:   Okay with admission to psych unit   recommend further PT/OT in psychiatry unit  close follow-up with neurology outpatient ASAP after discharge for NCS and EMG study.  Discussed with EDP Dr. Rhunette CroftNanavati   Consult Participants: me and RN Location of the provider: [facility] Location of the patient: [facility]  This consult was provided via telemedicine with 2-way video and audio communication. The patient/family was informed that care would be provided in this way and agreed to receive care in this manner.   This patient is receiving care for possible acute neurological changes. There was 45 minutes of care by this provider at the time of service, including time for direct evaluation via telemedicine, review of medical records, imaging studies and discussion of findings with providers, the patient and/or family.  Marvel PlanJindong Nehemiah Montee, MD PhD Stroke Neurology 10/31/2020 5:23 PM
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
## 2020-10-31 NOTE — ED Notes (Signed)
Pt given breakfast tray

## 2020-10-31 NOTE — ED Notes (Signed)
Pt sleeping at present, A&O x 4, no distress noted. Calm & cooperative, monitoring for safety.

## 2020-10-31 NOTE — ED Provider Notes (Signed)
[facility]-EMERGENCY DEPT Provider Note   CSN: [id number] Arrival date & time: 10/31/20  0320   History Chief Complaint  Patient presents with  . Drug Overdose    Bryan Harper is a 30 y.o. male.  The history is provided by the patient.  Drug Overdose  He has history of bipolar disorder, alcohol abuse, substance abuse and comes in after an intentional overdose of fentanyl.  He admits that he was trying to kill himself.  He had also consumed about half of 1/5 of liquor during the course of the day but denies other drug use.  He does have a history of suicide attempts in the past.  He states he still wants to kill himself.  He denies any hallucinations currently but has had hallucinations in the past.  He admits that he is not taking his medications for his bipolar disorder.  A bystander apparently gave him naloxone.  Since getting that, he has had nausea and vomiting.  Past Medical History:  Diagnosis Date  . Bipolar 1 disorder (HCC)   . ETOH abuse   . Polysubstance abuse [facility])     Patient Active Problem List   Diagnosis Date Noted  . Polysubstance abuse (HCC) 06/21/2020  . Substance induced mood disorder (HCC) 06/21/2020  . Suicidal ideation 06/21/2020    History reviewed. No pertinent surgical history.     No family history on file.  Social History   Tobacco Use  . Smoking status: Current Every Day Smoker  . Smokeless tobacco: Never Used  Substance Use Topics  . Alcohol use: Yes    Comment: 2 hours ago  . Drug use: Yes    Types: Cocaine, IV    Comment: 2 hours ago    Home Medications Prior to Admission medications   Not on File    Allergies    Patient has no known allergies.  Review of Systems   Review of Systems  All other systems reviewed and are negative.   Physical Exam Updated Vital Signs BP (!) 148/105 (BP Location: Right Arm)   Pulse 100   Temp 97.9 F (36.6 C) (Oral)   Resp 16   SpO2 98%   Physical  Exam Vitals and nursing note reviewed.   30 year old male, actively retching and vomiting, but is in no acute distress. Vital signs are significant for elevated blood pressure. Oxygen saturation is 98%, which is normal. Head is normocephalic and atraumatic. PERRLA, EOMI. Oropharynx is clear. Neck is nontender and supple without adenopathy or JVD. Back is nontender and there is no CVA tenderness. Lungs are clear without rales, wheezes, or rhonchi. Chest is nontender. Heart has regular rate and rhythm without murmur. Abdomen is soft, flat, nontender without masses or hepatosplenomegaly and peristalsis is normoactive. Extremities have no cyanosis or edema, full range of motion is present. Skin is warm and dry without rash. Neurologic: Mental status is normal, cranial nerves are intact, there are no motor or sensory deficits.  ED Results / Procedures / Treatments   Labs (all labs ordered are listed, but only abnormal results are displayed) Labs Reviewed  COMPREHENSIVE METABOLIC PANEL - Abnormal; Notable for the following components:      Result Value   Glucose, Bld 117 (*)    Calcium 8.7 (*)    AST 193 (*)    ALT 94 (*)    All other components within normal limits  ACETAMINOPHEN LEVEL - Abnormal; Notable for the following components:   Acetaminophen (Tylenol), Serum <  10 (*)    All other components within normal limits  SALICYLATE LEVEL - Abnormal; Notable for the following components:   Salicylate Lvl <7.0 (*)    All other components within normal limits  CBC WITH DIFFERENTIAL/PLATELET - Abnormal; Notable for the following components:   WBC 14.2 (*)    RBC 4.21 (*)    Neutro Abs 13.0 (*)    All other components within normal limits  ETHANOL - Abnormal; Notable for the following components:   Alcohol, Ethyl (B) 104 (*)    All other components within normal limits  RESP PANEL BY RT-PCR (FLU A&B, COVID) ARPGX2  RAPID URINE DRUG SCREEN, HOSP PERFORMED    Procedures Procedures   CRITICAL CARE Performed by: Dione Booze Total critical care time: 45 minutes Critical care time was exclusive of separately billable procedures and treating other patients. Critical care was necessary to treat or prevent imminent or life-threatening deterioration. Critical care was time spent personally by me on the following activities: development of treatment plan with patient and/or surrogate as well as nursing, discussions with consultants, evaluation of patient's response to treatment, examination of patient, obtaining history from patient or surrogate, ordering and performing treatments and interventions, ordering and review of laboratory studies, ordering and review of radiographic studies, pulse oximetry and re-evaluation of patient's condition.  Medications Ordered in ED Medications  ondansetron (ZOFRAN-ODT) disintegrating tablet 8 mg (has no administration in time range)  nicotine (NICODERM CQ - dosed in mg/24 hours) patch 14 mg (has no administration in time range)    ED Course  I have reviewed the triage vital signs and the nursing notes.  Pertinent labs & imaging results that were available during my care of the patient were reviewed by me and considered in my medical decision making (see chart for details).  MDM Rules/Calculators/A&P Fentanyl overdose with suicidal intent.  Nausea and vomiting secondary to naloxone administration.  Old records are reviewed, and he has multiple ED visits for substance abuse and suicidal thoughts.  He will need to be observed in the ED to make sure that he does not have respiratory depression as thr naloxone is metabolized.  Once he has been observed for 4 hours, he will be cleared for psychiatric evaluation.  7:29 AM Patient continues to be awake and alert and maintaining adequate oxygen saturations.  He is felt to be medically cleared for psychiatric evaluation.  Labs did show ethanol level which is above the legal limit of intoxication, but  not excessively high.  Transaminases are also mildly to moderately elevated which is new and may be related to alcohol abuse or hepatitis.  TTS consultation is requested regarding his suicide attempt.  Final Clinical Impression(s) / ED Diagnoses Final diagnoses:  Opiate overdose, intentional self-harm, initial encounter (HCC)  Alcohol intoxication, uncomplicated (HCC)  Elevated transaminase level    Rx / DC Orders ED Discharge Orders    None       Dione Booze, MD 10/31/20 [phone]
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
## 2020-10-31 NOTE — ED Triage Notes (Signed)
Pt arrives EMS after an overdose on fentanyl or heroin. Pt received Narcan by bystander. C/o left arm numbness.

## 2020-11-01 ENCOUNTER — Encounter (HOSPITAL_COMMUNITY): Payer: Self-pay | Admitting: Psychiatry

## 2020-11-01 ENCOUNTER — Inpatient Hospital Stay (HOSPITAL_COMMUNITY)
Admission: RE | Admit: 2020-11-01 | Discharge: 2020-11-03 | DRG: 897 | Disposition: A | Discharge status: Home / Self Care | Payer: Federal, State, Local not specified - Other | Source: Intra-hospital | Attending: Psychiatry | Admitting: Psychiatry

## 2020-11-01 ENCOUNTER — Telehealth (HOSPITAL_COMMUNITY): Payer: Self-pay | Admitting: General Practice

## 2020-11-01 DIAGNOSIS — Z9114 Patient's other noncompliance with medication regimen: Secondary | ICD-10-CM | POA: Diagnosis not present

## 2020-11-01 DIAGNOSIS — F419 Anxiety disorder, unspecified: Secondary | ICD-10-CM | POA: Diagnosis present

## 2020-11-01 DIAGNOSIS — F10239 Alcohol dependence with withdrawal, unspecified: Secondary | ICD-10-CM | POA: Diagnosis present

## 2020-11-01 DIAGNOSIS — F1020 Alcohol dependence, uncomplicated: Secondary | ICD-10-CM

## 2020-11-01 DIAGNOSIS — Z9151 Personal history of suicidal behavior: Secondary | ICD-10-CM | POA: Diagnosis not present

## 2020-11-01 DIAGNOSIS — Z79899 Other long term (current) drug therapy: Secondary | ICD-10-CM | POA: Diagnosis not present

## 2020-11-01 DIAGNOSIS — F1994 Other psychoactive substance use, unspecified with psychoactive substance-induced mood disorder: Secondary | ICD-10-CM | POA: Diagnosis present

## 2020-11-01 DIAGNOSIS — G4700 Insomnia, unspecified: Secondary | ICD-10-CM | POA: Diagnosis present

## 2020-11-01 DIAGNOSIS — G8929 Other chronic pain: Secondary | ICD-10-CM | POA: Diagnosis present

## 2020-11-01 DIAGNOSIS — F319 Bipolar disorder, unspecified: Secondary | ICD-10-CM | POA: Diagnosis present

## 2020-11-01 DIAGNOSIS — F1494 Cocaine use, unspecified with cocaine-induced mood disorder: Secondary | ICD-10-CM | POA: Diagnosis present

## 2020-11-01 DIAGNOSIS — F17210 Nicotine dependence, cigarettes, uncomplicated: Secondary | ICD-10-CM | POA: Diagnosis present

## 2020-11-01 LAB — LIPID PANEL
Cholesterol: 193 mg/dL (ref 0–200)
HDL: 95 mg/dL (ref >40)
LDL Cholesterol: 81 mg/dL (ref 0–99)
Total CHOL/HDL Ratio: 2 RATIO
Triglycerides: 83 mg/dL (ref <150)
VLDL: 17 mg/dL (ref 0–40)

## 2020-11-01 LAB — HEMOGLOBIN A1C
Hgb A1c MFr Bld: 5.1 % (ref 4.8–5.6)
Mean Plasma Glucose: 99.67 mg/dL

## 2020-11-01 LAB — TSH: TSH: 4.542 u[IU]/mL — ABNORMAL HIGH (ref 0.350–4.500)

## 2020-11-01 MED ORDER — METHOCARBAMOL 500 MG PO TABS
500.0000 mg | ORAL_TABLET | Freq: Three times a day (TID) | ORAL | Status: DC | PRN
  Administered 2020-11-02: 500 mg via ORAL
  Filled 2020-11-01: qty 1

## 2020-11-01 MED ORDER — CLONIDINE HCL 0.1 MG PO TABS: 0.1000 mg | ORAL_TABLET | Freq: Every day | ORAL | Status: DC

## 2020-11-01 MED ORDER — CLONIDINE HCL 0.1 MG PO TABS
0.1000 mg | ORAL_TABLET | Freq: Four times a day (QID) | ORAL | Status: DC
  Administered 2020-11-01 – 2020-11-02 (×4): 0.1 mg via ORAL
  Filled 2020-11-01 (×8): qty 1

## 2020-11-01 MED ORDER — THIAMINE HCL 100 MG PO TABS
100.0000 mg | ORAL_TABLET | Freq: Every day | ORAL | Status: DC
  Administered 2020-11-01 – 2020-11-03 (×3): 100 mg via ORAL
  Filled 2020-11-01 (×5): qty 1

## 2020-11-01 MED ORDER — CLONIDINE HCL 0.1 MG PO TABS
0.1000 mg | ORAL_TABLET | ORAL | Status: DC
  Filled 2020-11-01: qty 1

## 2020-11-01 MED ORDER — GABAPENTIN 100 MG PO CAPS
200.0000 mg | ORAL_CAPSULE | Freq: Three times a day (TID) | ORAL | Status: DC
  Administered 2020-11-01 – 2020-11-02 (×5): 200 mg via ORAL
  Filled 2020-11-01 (×8): qty 2

## 2020-11-01 MED ORDER — FOLIC ACID 1 MG PO TABS
1.0000 mg | ORAL_TABLET | Freq: Every day | ORAL | Status: DC
  Administered 2020-11-01 – 2020-11-03 (×3): 1 mg via ORAL
  Filled 2020-11-01 (×5): qty 1

## 2020-11-01 MED ORDER — HYDROXYZINE HCL 25 MG PO TABS
25.0000 mg | ORAL_TABLET | Freq: Three times a day (TID) | ORAL | Status: DC | PRN
  Administered 2020-11-01 – 2020-11-02 (×3): 25 mg via ORAL
  Filled 2020-11-01: qty 10
  Filled 2020-11-01 (×2): qty 1

## 2020-11-01 MED ORDER — NAPROXEN 500 MG PO TABS
500.0000 mg | ORAL_TABLET | Freq: Two times a day (BID) | ORAL | Status: DC | PRN
  Administered 2020-11-02: 500 mg via ORAL
  Filled 2020-11-01: qty 1

## 2020-11-01 MED ORDER — LOPERAMIDE HCL 2 MG PO CAPS: 2.0000 mg | ORAL_CAPSULE | ORAL | Status: DC | PRN

## 2020-11-01 MED ORDER — FLUOXETINE HCL 20 MG PO CAPS
20.0000 mg | ORAL_CAPSULE | Freq: Every day | ORAL | Status: DC
  Administered 2020-11-01 – 2020-11-03 (×3): 20 mg via ORAL
  Filled 2020-11-01: qty 1
  Filled 2020-11-01: qty 7
  Filled 2020-11-01 (×4): qty 1

## 2020-11-01 MED ORDER — HYDROXYZINE HCL 25 MG PO TABS
25.0000 mg | ORAL_TABLET | Freq: Four times a day (QID) | ORAL | Status: DC | PRN
  Filled 2020-11-01 (×2): qty 1

## 2020-11-01 MED ORDER — ONDANSETRON 4 MG PO TBDP: 4.0000 mg | ORAL_TABLET | Freq: Four times a day (QID) | ORAL | Status: DC | PRN

## 2020-11-01 MED ORDER — TRAZODONE HCL 50 MG PO TABS
50.0000 mg | ORAL_TABLET | Freq: Every evening | ORAL | Status: DC | PRN
  Administered 2020-11-01: 50 mg via ORAL
  Filled 2020-11-01 (×2): qty 1
  Filled 2020-11-01: qty 7

## 2020-11-01 MED ORDER — LORAZEPAM 1 MG PO TABS: 1.0000 mg | ORAL_TABLET | Freq: Four times a day (QID) | ORAL | Status: DC | PRN

## 2020-11-01 MED ORDER — DICYCLOMINE HCL 20 MG PO TABS: 20.0000 mg | ORAL_TABLET | Freq: Four times a day (QID) | ORAL | Status: DC | PRN
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
## 2020-11-01 NOTE — BHH Suicide Risk Assessment (Signed)
BHH INPATIENT:  Family/Significant Other Suicide Prevention Education  Suicide Prevention Education:  Contact Attempts: Bryan Harper [phone] (Mother) has been identified by the patient as the family member/significant other with whom the patient will be residing, and identified as the person(s) who will aid the patient in the event of a mental health crisis.  With written consent from the patient, two attempts were made to provide suicide prevention education, prior to and/or following the patient's discharge.  We were unsuccessful in providing suicide prevention education.  A suicide education pamphlet was given to the patient to share with family/significant other.  Date and time of first attempt: 11/01/2020 at 12:50pm,  The recording states that the number is no longer in service.  CSW will talk with the patient to see if there is another number to contact his mother or another family member.    [name] 11/01/2020, 12:54 PM

## 2020-11-01 NOTE — Progress Notes (Signed)
Recreation Therapy Notes  Date: 3.9.22 Time: 0930 Location: [address] Dayroom  Group Topic: Stress Management  Goal Area(s) Addresses:  Patient will identify positive stress management techniques. Patient will identify benefits of using stress management post d/c.  Intervention: Stress Management  Activity:  Meditation.  LRT played a meditation that focused on taking on the characteristics of a mountain by standing strong in the face of adversity.    Education:  Stress Management, Discharge Planning.   Education Outcome: Acknowledges Education  Clinical Observations/Feedback: Pt did not attend group session.     Caroll Rancher, LRT/CTRS         Lillia Abed, Marjette A 11/01/2020 11:15 AM

## 2020-11-01 NOTE — Tx Team (Signed)
Initial Treatment Plan 11/01/2020 2:37 AM Bryan Harper ZJQ:[id number]    PATIENT STRESSORS: Medication change or noncompliance Substance abuse   PATIENT STRENGTHS: Average or above average intelligence Motivation for treatment/growth   PATIENT IDENTIFIED PROBLEMS: Suicidal ideations   Substance abuse - OD'd on Fentanyl  Anxiety  Depression  Medication non-compliance  (pt wants to get his mind back together)           DISCHARGE CRITERIA:  Adequate post-discharge living arrangements Improved stabilization in mood, thinking, and/or behavior Verbal commitment to aftercare and medication compliance  PRELIMINARY DISCHARGE PLAN: Attend PHP/IOP Outpatient therapy Placement in alternative living arrangements  PATIENT/FAMILY INVOLVEMENT: This treatment plan has been presented to and reviewed with the patient, Bryan Harper, and/or family member.  The patient and family have been given the opportunity to ask questions and make suggestions.  [name], RN 11/01/2020, 2:37 AM
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
## 2020-11-01 NOTE — Progress Notes (Signed)
Pt c/o dizziness this morning. Pt gait evaluated. Pt steady on his feet. Pt given Gatorade to drink. Pt VSS this morning. Pt also had blood drawn for labs.

## 2020-11-01 NOTE — Plan of Care (Signed)
  Problem: Activity: Goal: Interest or engagement in activities will improve Outcome: Progressing Goal: Sleeping patterns will improve Outcome: Progressing   

## 2020-11-01 NOTE — Progress Notes (Signed)
D: Patient presents with sad and depressed affect. Patient is pleasant and cooperative during assessment. Patient reports no feeling in his left arm and is concerned about if he will regain feeling in his arm. Patient is positive for passive SI and reports he has thoughts of harming himself all the time but has no intention on acting on these ideations. Patient contracts for safety and agrees to approach staff if he feels he will harm himself. Patient denies HI/AH/VH at this time.  A: Provided positive reinforcement and encouragement.  R: Patient cooperative and receptive to efforts. Patient remains safe on the unit.   11/01/20 2058  Psych Admission Type (Psych Patients Only)  Admission Status Voluntary  Psychosocial Assessment  Patient Complaints Depression;Self-harm thoughts  Eye Contact Fair  Facial Expression Sad;Pensive  Affect Depressed;Sad  Speech Logical/coherent  Interaction Assertive  Motor Activity Slow  Appearance/Hygiene Unremarkable;In scrubs  Behavior Characteristics Cooperative;Appropriate to situation  Mood Depressed;Sad  Thought Process  Coherency WDL  Content WDL  Delusions None reported or observed  Perception WDL  Hallucination None reported or observed  Judgment Impaired  Confusion None  Danger to Self  Current suicidal ideation? Passive  Self-Injurious Behavior No self-injurious ideation or behavior indicators observed or expressed   Agreement Not to Harm Self Yes  Description of Agreement verbal contract  Danger to Others  Danger to Others None reported or observed
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
## 2020-11-01 NOTE — Progress Notes (Signed)
   11/01/20 0140  Psych Admission Type (Psych Patients Only)  Admission Status Voluntary  Psychosocial Assessment  Patient Complaints Substance abuse;Self-harm thoughts  Eye Contact Fair  Facial Expression Flat  Affect Flat  Speech Logical/coherent  Interaction Assertive  Motor Activity Slow  Appearance/Hygiene In scrubs  Behavior Characteristics Cooperative;Appropriate to situation  Mood Depressed;Anxious  Thought Process  Coherency WDL  Content Blaming self  Delusions None reported or observed  Perception WDL  Hallucination None reported or observed  Judgment Impaired  Confusion None  Danger to Self  Current suicidal ideation? Passive  Self-Injurious Behavior No self-injurious ideation or behavior indicators observed or expressed   Agreement Not to Harm Self Yes  Description of Agreement verbal agreement  Danger to Others  Danger to Others None reported or observed

## 2020-11-01 NOTE — Telephone Encounter (Signed)
Care Management - Follow Up [facility] Discharges   Patient is hospitalized at a inpatient psychiatric hospital [facility].

## 2020-11-01 NOTE — Tx Team (Signed)
Interdisciplinary Treatment and Diagnostic Plan Update  11/01/2020 Time of Session: 9:15am  Bryan Harper MRN: [id number]  Principal Diagnosis: <principal problem not specified>  Secondary Diagnoses: Active Problems:   Substance induced mood disorder (HCC)   Current Medications:  Current Facility-Administered Medications  Medication Dose Route Frequency Provider Last Rate Last Admin  . cloNIDine (CATAPRES) tablet 0.1 mg  0.1 mg Oral QID Sharma Covert, MD       Followed by  . [START ON 11/03/2020] cloNIDine (CATAPRES) tablet 0.1 mg  0.1 mg Oral BH-qamhs Clary, Cordie Grice, MD       Followed by  . [START ON 11/05/2020] cloNIDine (CATAPRES) tablet 0.1 mg  0.1 mg Oral QAC breakfast Sharma Covert, MD      . dicyclomine (BENTYL) tablet 20 mg  20 mg Oral Q6H PRN Sharma Covert, MD      . FLUoxetine (PROZAC) capsule 20 mg  20 mg Oral Daily Lindon Romp A, NP   20 mg at 11/01/20 0859  . folic acid (FOLVITE) tablet 1 mg  1 mg Oral Daily Sharma Covert, MD      . gabapentin (NEURONTIN) capsule 200 mg  200 mg Oral TID Lindon Romp A, NP   200 mg at 11/01/20 0859  . hydrOXYzine (ATARAX/VISTARIL) tablet 25 mg  25 mg Oral Q6H PRN Sharma Covert, MD      . hydrOXYzine (ATARAX/VISTARIL) tablet 25 mg  25 mg Oral TID PRN Sharma Covert, MD      . loperamide (IMODIUM) capsule 2-4 mg  2-4 mg Oral PRN Sharma Covert, MD      . LORazepam (ATIVAN) tablet 1 mg  1 mg Oral Q6H PRN Sharma Covert, MD      . methocarbamol (ROBAXIN) tablet 500 mg  500 mg Oral Q8H PRN Sharma Covert, MD      . naproxen (NAPROSYN) tablet 500 mg  500 mg Oral BID PRN Sharma Covert, MD      . ondansetron (ZOFRAN-ODT) disintegrating tablet 4 mg  4 mg Oral Q6H PRN Sharma Covert, MD      . thiamine tablet 100 mg  100 mg Oral Daily Sharma Covert, MD      . traZODone (DESYREL) tablet 50 mg  50 mg Oral QHS PRN Sharma Covert, MD       PTA Medications: No medications prior to  admission.    Patient Stressors: Medication change or noncompliance Substance abuse  Patient Strengths: Average or above average intelligence Motivation for treatment/growth  Treatment Modalities: Medication Management, Group therapy, Case management,  1 to 1 session with clinician, Psychoeducation, Recreational therapy.   Physician Treatment Plan for Primary Diagnosis: <principal problem not specified> Long Term Goal(s):     Short Term Goals:    Medication Management: Evaluate patient's response, side effects, and tolerance of medication regimen.  Therapeutic Interventions: 1 to 1 sessions, Unit Group sessions and Medication administration.  Evaluation of Outcomes: Not Met  Physician Treatment Plan for Secondary Diagnosis: Active Problems:   Substance induced mood disorder ([place])  Long Term Goal(s):     Short Term Goals:       Medication Management: Evaluate patient's response, side effects, and tolerance of medication regimen.  Therapeutic Interventions: 1 to 1 sessions, Unit Group sessions and Medication administration.  Evaluation of Outcomes: Not Met   RN Treatment Plan for Primary Diagnosis: <principal problem not specified> Long Term Goal(s): Knowledge of disease and therapeutic regimen to maintain health will  improve  Short Term Goals: Ability to remain free from injury will improve, Ability to participate in decision making will improve, Ability to verbalize feelings will improve, Ability to disclose and discuss suicidal ideas and Ability to identify and develop effective coping behaviors will improve  Medication Management: RN will administer medications as ordered by provider, will assess and evaluate patient's response and provide education to patient for prescribed medication. RN will report any adverse and/or side effects to prescribing provider.  Therapeutic Interventions: 1 on 1 counseling sessions, Psychoeducation, Medication administration, Evaluate  responses to treatment, Monitor vital signs and CBGs as ordered, Perform/monitor CIWA, COWS, AIMS and Fall Risk screenings as ordered, Perform wound care treatments as ordered.  Evaluation of Outcomes: Not Met   LCSW Treatment Plan for Primary Diagnosis: <principal problem not specified> Long Term Goal(s): Safe transition to appropriate next level of care at discharge, Engage patient in therapeutic group addressing interpersonal concerns.  Short Term Goals: Engage patient in aftercare planning with referrals and resources, Increase social support, Increase emotional regulation, Facilitate acceptance of mental health diagnosis and concerns, Facilitate patient progression through stages of change regarding substance use diagnoses and concerns, Identify triggers associated with mental health/substance abuse issues and Increase skills for wellness and recovery  Therapeutic Interventions: Assess for all discharge needs, 1 to 1 time with Social worker, Explore available resources and support systems, Assess for adequacy in community support network, Educate family and significant other(s) on suicide prevention, Complete Psychosocial Assessment, Interpersonal group therapy.  Evaluation of Outcomes: Not Met   Progress in Treatment: Attending groups: No. Participating in groups: No. Taking medication as prescribed: Yes. Toleration medication: Yes. Family/Significant other contact made: Yes, individual(s) contacted:  Mother  Patient understands diagnosis: Yes. Discussing patient identified problems/goals with staff: Yes. Medical problems stabilized or resolved: Yes. Denies suicidal/homicidal ideation: Yes. Issues/concerns per patient self-inventory: No.   New problem(s) identified: No, Describe:  None   New Short Term/Long Term Goal(s): medication stabilization, elimination of SI thoughts, development of comprehensive mental wellness plan.   Patient Goals:  "To get my thoughts and feelings  straight"   Discharge Plan or Barriers: Patient recently admitted. CSW will continue to follow and assess for appropriate referrals and possible discharge planning.   Reason for Continuation of Hospitalization: Depression Medication stabilization Suicidal ideation Withdrawal symptoms  Estimated Length of Stay: 3 to 5 days   Attendees: Patient: Bryan Harper 11/01/2020   Physician: Myles Lipps, MD 11/01/2020   Nursing:  11/01/2020   RN Care Manager: 11/01/2020   Social Worker: Verdis Frederickson, [place] 11/01/2020   Recreational Therapist:  11/01/2020   Other:  11/01/2020   Other:  11/01/2020   Other: 11/01/2020     Scribe for Treatment Team: Darleen Crocker, [place] 11/01/2020 11:31 AM

## 2020-11-01 NOTE — BHH Counselor (Signed)
CSW provided the patient with a resource packet that contained housing and shelter services, food resources, [place] cards, suicide prevention numbers, and information for disability and the [employer] card.  CSW will continue to work with the patient to form a discharge plan.
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
## 2020-11-01 NOTE — Progress Notes (Signed)
   11/01/20 1530  Psych Admission Type (Psych Patients Only)  Admission Status Voluntary  Psychosocial Assessment  Patient Complaints Depression;Self-harm thoughts  Eye Contact Fair  Facial Expression Flat  Affect Flat  Speech Logical/coherent  Interaction Assertive  Motor Activity Slow  Appearance/Hygiene Unremarkable;In scrubs  Behavior Characteristics Cooperative;Appropriate to situation  Mood Depressed;Anxious  Thought Process  Coherency WDL  Content Blaming others  Delusions None reported or observed  Perception WDL  Hallucination None reported or observed  Judgment Impaired  Confusion None  Danger to Self  Current suicidal ideation? Passive  Self-Injurious Behavior No self-injurious ideation or behavior indicators observed or expressed   Agreement Not to Harm Self Yes  Description of Agreement verbal agreement to not harm self  Danger to Others  Danger to Others None reported or observed  He has been in his room much of the day.  He reported feeling ill and would rather rest today.  Encouraged him to complete self inventory and as of this time he has not.  He continued to report passive SI and verbally agrees to not harm himself on the unit.  Will continue to encourage him to fill out daily inventory.

## 2020-11-01 NOTE — [facility] Group Notes (Signed)
[facility] LCSW Group Therapy  11/01/2020 1:51 PM  Type of Therapy:  Gratitude Activity   Participation Level:  Minimal  Participation Quality:  Appropriate  Affect:  Depressed  Cognitive:  Appropriate  Insight:  Developing/Improving  Engagement in Therapy:  Developing/Improving  Modes of Intervention:  Activity and Discussion  Summary of Progress/Problems: Kell stated that he is grateful for someone teaching him how to fish.  Pacen decorated his bag but states that it is difficult to find things to be grateful for at this time.    [place] Kung Devan Danzer 11/01/2020, 1:51 PM

## 2020-11-02 DIAGNOSIS — F1994 Other psychoactive substance use, unspecified with psychoactive substance-induced mood disorder: Secondary | ICD-10-CM | POA: Diagnosis not present

## 2020-11-02 MED ORDER — GABAPENTIN 300 MG PO CAPS
300.0000 mg | ORAL_CAPSULE | Freq: Three times a day (TID) | ORAL | Status: DC
  Administered 2020-11-02 – 2020-11-03 (×2): 300 mg via ORAL
  Filled 2020-11-02: qty 21
  Filled 2020-11-02 (×3): qty 1
  Filled 2020-11-02: qty 21
  Filled 2020-11-02: qty 1
  Filled 2020-11-02: qty 21
  Filled 2020-11-02 (×2): qty 1
  Filled 2020-11-02: qty 21

## 2020-11-02 NOTE — Progress Notes (Signed)
   11/02/20 2203  Psych Admission Type (Psych Patients Only)  Admission Status Voluntary  Psychosocial Assessment  Patient Complaints None  Eye Contact Fair  Facial Expression Pensive;Sad  Affect Depressed;Sad  Speech Logical/coherent  Interaction Assertive  Motor Activity Slow  Appearance/Hygiene In scrubs;Unremarkable  Behavior Characteristics Cooperative;Appropriate to situation  Mood Sad  Thought Process  Coherency WDL  Content WDL  Delusions None reported or observed  Perception WDL  Hallucination None reported or observed  Judgment Impaired  Confusion None  Danger to Self  Current suicidal ideation? Passive  Self-Injurious Behavior No self-injurious ideation or behavior indicators observed or expressed   Agreement Not to Harm Self Yes  Description of Agreement verbal contract  Danger to Others  Danger to Others None reported or observed

## 2020-11-02 NOTE — Progress Notes (Signed)
Psychoeducational Group Note  Date:  11/02/2020 Time:  2015  Group Topic/Focus:  wrap up group  Participation Level: Did Not Attend  Participation Quality:  Not Applicable  Affect:  Not Applicable  Cognitive:  Not Applicable  Insight:  Not Applicable  Engagement in Group: Not Applicable  Additional Comments:  Pt did not attend.  Marcille Buffy 11/02/2020, 9:12 PM

## 2020-11-02 NOTE — Progress Notes (Signed)
Pt reports that he is having SI and said "If I weren't in the hospital, I'd either hang myself or stab myself in the neck."  Pt said he will let staff know if he has intention of harming himself while in the hospital. Pt is having no signs or symptoms of withdrawal.  Pt took medications without incident and no adverse reactions were noted.  Pt remains safe on the unit with q 15 min safety checks in place.

## 2020-11-02 NOTE — Progress Notes (Signed)
[facility] MD Progress Note  11/02/2020 11:20 AM Bryan Harper  MRN:  [id number] Subjective: Patient is a 30 year old male with a past psychiatric history significant for polysubstance dependence who presented to the [facility] emergency department on 10/31/2020 after an intentional overdose of fentanyl or heroin.  He apparently received Narcan by a bystander, and then was taken to the emergency department.  He admitted to suicidal ideation in the emergency department.  Objective: Patient is seen and examined.  Patient is a 30 year old male with the above-stated past psychiatric history who is seen in follow-up.  We discussed the findings from the electronic medical record today.  He stated today that he was not interested in residential substance abuse treatment as he had told me yesterday, but wants to go to a mental health facility that he can stay long enough to get the medicines in his system.  He then stated that he is unable to afford his medicines, and was not taking them.  We discussed the fact that he had been in a residential program in the [place] area shortly before coming to [place].  He stated they did not give him his medicines there.  We discussed the need to stay off substances, but he stated the reason why he uses substances is because he is depressed and no one treats him for that.  His vital signs are stable, he is afebrile.  His most recent CIWA from yesterday was only 4.  His most recent COWS this morning was only 3.  His lipid panel from 11/01/2020 was essentially normal.  His hemoglobin A1c from 3/9 was 5.1.  TSH was 4.542.  He slept approximately 7 hours last night.  Principal Problem: <principal problem not specified> Diagnosis: Active Problems:   Substance induced mood disorder (HCC)  Total Time spent with patient: 20 minutes  Past Psychiatric History: See admission H&P  Past Medical History:  Past Medical History:  Diagnosis Date  . Bipolar 1 disorder  (HCC)   . ETOH abuse   . Polysubstance abuse (HCC)    History reviewed. No pertinent surgical history. Family History: History reviewed. No pertinent family history. Family Psychiatric  History: See admission H&P Social History:  Social History   Substance and Sexual Activity  Alcohol Use Yes   Comment: 2 days ago; drinks 1/5 liquor daily     Social History   Substance and Sexual Activity  Drug Use Yes  . Types: Cocaine, IV, Fentanyl   Comment: 2 days ago    Social History   Socioeconomic History  . Marital status: Single    Spouse name: Not on file  . Number of children: Not on file  . Years of education: Not on file  . Highest education level: Not on file  Occupational History  . Not on file  Tobacco Use  . Smoking status: Current Every Day Smoker    Packs/day: 1.00  . Smokeless tobacco: Never Used  Vaping Use  . Vaping Use: Never used  Substance and Sexual Activity  . Alcohol use: Yes    Comment: 2 days ago; drinks 1/5 liquor daily  . Drug use: Yes    Types: Cocaine, IV, Fentanyl    Comment: 2 days ago  . Sexual activity: Not on file  Other Topics Concern  . Not on file  Social History Narrative  . Not on file   Social Determinants of Health   Financial Resource Strain: Not on file  Food Insecurity: Not on file  Transportation  Needs: Not on file  Physical Activity: Not on file  Stress: Not on file  Social Connections: Not on file   Additional Social History:                         Sleep: Good  Appetite:  Good  Current Medications: Current Facility-Administered Medications  Medication Dose Route Frequency Provider Last Rate Last Admin  . cloNIDine (CATAPRES) tablet 0.1 mg  0.1 mg Oral QID Antonieta Pert, MD   0.1 mg at 11/02/20 0746   Followed by  . [START ON 11/03/2020] cloNIDine (CATAPRES) tablet 0.1 mg  0.1 mg Oral BH-qamhs Clary, Marlane Mingle, MD       Followed by  . [START ON 11/05/2020] cloNIDine (CATAPRES) tablet 0.1 mg  0.1  mg Oral QAC breakfast Antonieta Pert, MD      . dicyclomine (BENTYL) tablet 20 mg  20 mg Oral Q6H PRN Antonieta Pert, MD      . FLUoxetine (PROZAC) capsule 20 mg  20 mg Oral Daily Nira Conn A, NP   20 mg at 11/02/20 0747  . folic acid (FOLVITE) tablet 1 mg  1 mg Oral Daily Antonieta Pert, MD   1 mg at 11/02/20 0746  . gabapentin (NEURONTIN) capsule 200 mg  200 mg Oral TID Nira Conn A, NP   200 mg at 11/02/20 0746  . hydrOXYzine (ATARAX/VISTARIL) tablet 25 mg  25 mg Oral Q6H PRN Antonieta Pert, MD      . hydrOXYzine (ATARAX/VISTARIL) tablet 25 mg  25 mg Oral TID PRN Antonieta Pert, MD   25 mg at 11/02/20 0746  . loperamide (IMODIUM) capsule 2-4 mg  2-4 mg Oral PRN Antonieta Pert, MD      . LORazepam (ATIVAN) tablet 1 mg  1 mg Oral Q6H PRN Antonieta Pert, MD      . methocarbamol (ROBAXIN) tablet 500 mg  500 mg Oral Q8H PRN Antonieta Pert, MD   500 mg at 11/02/20 0746  . naproxen (NAPROSYN) tablet 500 mg  500 mg Oral BID PRN Antonieta Pert, MD      . ondansetron (ZOFRAN-ODT) disintegrating tablet 4 mg  4 mg Oral Q6H PRN Antonieta Pert, MD      . thiamine tablet 100 mg  100 mg Oral Daily Antonieta Pert, MD   100 mg at 11/02/20 0747  . traZODone (DESYREL) tablet 50 mg  50 mg Oral QHS PRN Antonieta Pert, MD   50 mg at 11/01/20 2056    Lab Results:  Results for orders placed or performed during the hospital encounter of 11/01/20 (from the past 48 hour(s))  Hemoglobin A1c     Status: None   Collection Time: 11/01/20  6:29 AM  Result Value Ref Range   Hgb A1c MFr Bld 5.1 4.8 - 5.6 %    Comment: (NOTE) Pre diabetes:          5.7%-6.4%  Diabetes:              >6.4%  Glycemic control for   <7.0% adults with diabetes    Mean Plasma Glucose 99.67 mg/dL    Comment: Performed at [facility], [address], [place], [place] [address]  Lipid panel     Status: None   Collection Time: 11/01/20  6:29 AM  Result Value Ref Range   Cholesterol  193 0 - 200 mg/dL   Triglycerides 83 <  150 mg/dL   HDL 95 >27 mg/dL   Total CHOL/HDL Ratio 2.0 RATIO   VLDL 17 0 - 40 mg/dL   LDL Cholesterol 81 0 - 99 mg/dL    Comment:        Total Cholesterol/HDL:CHD Risk Coronary Heart Disease Risk Table                     Men   Women  1/2 Average Risk   3.4   3.3  Average Risk       5.0   4.4  2 X Average Risk   9.6   7.1  3 X Average Risk  23.4   11.0        Use the calculated Patient Ratio above and the CHD Risk Table to determine the patient's CHD Risk.        ATP III CLASSIFICATION (LDL):  <100     mg/dL   Optimal  035-009  mg/dL   Near or Above                    Optimal  130-159  mg/dL   Borderline  381-829  mg/dL   High  >937     mg/dL   Very High Performed at [facility], [address] [place], [place] [address]   TSH     Status: Abnormal   Collection Time: 11/01/20  6:29 AM  Result Value Ref Range   TSH 4.542 (H) 0.350 - 4.500 uIU/mL    Comment: Performed by a 3rd Generation assay with a functional sensitivity of <=0.01 uIU/mL. Performed at [facility], [address] [place], [place] [address]     Blood Alcohol level:  Lab Results  Component Value Date   ETH 104 (H) 10/31/2020   ETH 27 (H) 06/21/2020    Metabolic Disorder Labs: Lab Results  Component Value Date   HGBA1C 5.1 11/01/2020   MPG 99.67 11/01/2020   No results found for: PROLACTIN Lab Results  Component Value Date   CHOL 193 11/01/2020   TRIG 83 11/01/2020   HDL 95 11/01/2020   CHOLHDL 2.0 11/01/2020   VLDL 17 11/01/2020   LDLCALC 81 11/01/2020    Physical Findings: AIMS:  , ,  ,  ,    CIWA:    COWS:  COWS Total Score: 3  Musculoskeletal: Strength & Muscle Tone: within normal limits Gait & Station: normal Patient leans: N/A  Psychiatric Specialty Exam:  Presentation  General Appearance: Fairly Groomed  Eye Contact:Fair  Speech:Normal Rate  Speech  Volume:Normal  Handedness:Right   Mood and Affect  Mood:Dysphoric  Affect:Appropriate   Thought Process  Thought Processes:Coherent  Descriptions of Associations:Circumstantial  Orientation:Full (Time, Place and Person)  Thought Content:Illogical  History of Schizophrenia/Schizoaffective disorder:No data recorded Duration of Psychotic Symptoms:No data recorded Hallucinations:Hallucinations: None  Ideas of Reference:None  Suicidal Thoughts:Suicidal Thoughts: No SI Active Intent and/or Plan: Without Intent; Without Plan SI Passive Intent and/or Plan: Without Intent; Without Plan  Homicidal Thoughts:Homicidal Thoughts: No   Sensorium  Memory:Immediate Poor; Recent Poor; Remote Poor  Judgment:Poor  Insight:Lacking   Executive Functions  Concentration:Good  Attention Span:Good  Recall:Poor  Fund of Knowledge:Good  Language:Good   Psychomotor Activity  Psychomotor Activity:Psychomotor Activity: Normal   Assets  Assets:Desire for Improvement; Resilience   Sleep  Sleep:Sleep: Good Number of Hours of Sleep: 7    Physical Exam: Physical Exam Vitals and nursing note reviewed.  Constitutional:  Appearance: Normal appearance.  HENT:     Head: Normocephalic and atraumatic.  Pulmonary:     Effort: Pulmonary effort is normal.  Neurological:     General: No focal deficit present.     Mental Status: He is alert and oriented to person, place, and time.    ROS Blood pressure 103/69, pulse 87, temperature 97.8 F (36.6 C), temperature source Oral, height 5\' 8"  (1.727 m), weight 52.6 kg, SpO2 98 %. Body mass index is 17.64 kg/m.   Treatment Plan Summary: Daily contact with patient to assess and evaluate symptoms and progress in treatment, Medication management and Plan : Patient is seen and examined.  Patient is a 30 year old male with the above-stated past psychiatric history who is seen in follow-up.   Diagnosis: 1.  Substance-induced mood  disorder. 2.  Alcohol dependence. 3.  Reported opiate dependence. 4.  Reported cocaine dependence 5.  Left upper extremity weakness  Pertinent findings on examination today: 1.  No evidence of withdrawal symptoms currently. 2.  Patient now stated that he does not want to go to a substance abuse residential facility but wants to go to some form of a long stay psychiatric facility.  Plan: 1.  Stop clinical opiate withdrawal protocol. 2.  Stop lorazepam for as needed withdrawal symptoms 3.  Continue fluoxetine 20 mg p.o. daily for anxiety and depression. 4.  Continue folic acid 1 mg p.o. daily for nutritional supplementation. 5.  Continue Neurontin 200 mg p.o. 3 times daily for anxiety and chronic pain. 6.  Continue hydroxyzine 25 mg p.o. every 6 hours as needed anxiety. 7.  Awaiting OT evaluation 8.  Continue trazodone 50 mg p.o. nightly as needed insomnia. 9.  Disposition planning-probable discharge tomorrow morning.  Antonieta PertGreg Lawson Clary, MD 11/02/2020, 11:20 AM

## 2020-11-02 NOTE — Evaluation (Signed)
Occupational Therapy Evaluation Patient Details Name: Bryan Harper MRN: [id number] DOB: 07/20/1991 Today's Date: 11/02/2020    History of Present Illness Roddy is a 30 y/o male with PMHx signficant for polysubstance use/dependence who presented to the [facility] ED on 10/31/2020 after an intentional overdose on heroin or fentanyl. Pt was found to have L arm weakness and L facial numbness after a fall. L facial numbness improved during stay in ED, L arm weakness remains. He reports he is unable to move it. Neurological evaluation showed no any visual changes, speech difficulty or leg weakness.  A CT scan of the head was negative, CT scan of head and neck showed no significant abnormalities.  MRI of the brain showed no infarcts.  His MRI of the C-spine showed no spinal cord compression.  He was then transferred to [facility]. OT ordered to address L arm weakness.   Clinical Impression   Pt was seen on the unit for individual OT evaluation and assessment to address new onset of L arm weakness. Pt was found unconscious out in the community after an overdose on fentanyl/heroin; pt reports not knowing how long he was unconscious for, however upon arrival to hospital, reports onset of L arm weakness and inability to use L arm. Prior to admission, pt was independent with all ADL/iADLs and denies any issues/concerns with L arm prior to overdose. Suspected radial nerve injury - possible Saturday night palsy as result of unconscious OD. Pt was provided demonstration and education on self ROM exercises for L shoulder and hand - forward arm lift, "rock the baby" stretch, shoulder I/E rotation, elbow flexion/extension, supination/pronation, and wrist extension/flexion. Pt also provided with education on sensory retraining exercises for L arm/shoulder - rubbing a wet washcloth, rubbing sleeve of shirt, putting on lotion. Pt able to demonstrate exercises back and receptive to education. Pt also provided education around  compensatory techniques to support L arm while inpatient on the unit without use of sling - supporting L arm with R arm and using pocket of sweatshirt to support arm. Recommend MD order sling for patient use at discharge. Recommend outpatient OT follow up to address L arm weakness if symptoms do not improve. No additional OT needs identified, will sign off.     Follow Up Recommendations  Outpatient OT    Equipment Recommendations   (Sling)    Recommendations for Other Services       Precautions / Restrictions Precautions Required Braces or Orthoses: Sling Restrictions Weight Bearing Restrictions: Yes LUE Weight Bearing: Weight bearing as tolerated Other Position/Activity Restrictions: Recommend MD order sling for patient use at discharge to stabilize and support L arm. Recommend WBAT. Provided compensatory techniques while inpatient at [facility] - support arm in pocket of sweatshirt, support with R functioning arm (slings not allowed on unit due to ligature risk)             ADL either performed or assessed with clinical judgement   ADL Overall ADL's : Independent                                       General ADL Comments: Pt independent with all ADL/iADLs - reports minor difficulty with UB and LB dressing - educated and provided compensatory strategies to complete dressing and showering tasks using one-handed technique     Vision Baseline Vision/History: No visual deficits       Perception  Praxis      Pertinent Vitals/Pain Pain Assessment: 0-10 Pain Location: L shoulder, L hand, all 5 digits Pain Descriptors / Indicators: Numbness;Tingling Pain Intervention(s): Monitored during session;Repositioned     Hand Dominance Right   Extremity/Trunk Assessment             Communication Communication Communication: No difficulties   Cognition Arousal/Alertness: Awake/alert Behavior During Therapy: WFL for tasks assessed/performed Overall Cognitive  Status: Within Functional Limits for tasks assessed                                     General Comments       Exercises Other Exercises Other Exercises: Pt provided, demonstrated, and educated on self ROM exercises for L shoulder and hand - forward arm lift, 'rock the baby' stretch, shoulder I/E rotation, elbow flexion/extension, supination/pronation, wrist extension/flexion Other Exercises: Pt provided, demonstrated, and educated on sensory retraining exercises for L arm/shoulder - rubbing a wet washcloth, rubbing sleeve of shirt, putting on lotion   Shoulder Instructions      Home Living Family/patient expects to be discharged to:: Shelter/Homeless Living Arrangements: Alone                               Additional Comments: Pt homeless, recently relocated from [place], reports intent to stay in [place] at discharge.      Prior Functioning/Environment Level of Independence: Independent                 OT Problem List: Decreased strength;Decreased range of motion;Decreased activity tolerance;Decreased knowledge of use of DME or AE;Impaired UE functional use      OT Treatment/Interventions:      OT Goals(Current goals can be found in the care plan section) Acute Rehab OT Goals Patient Stated Goal: "I want my arm back to working normally again" OT Goal Formulation: With patient Time For Goal Achievement: 11/16/20 Potential to Achieve Goals: Good  OT Frequency:     Barriers to D/C:            Co-evaluation              AM-PAC OT "6 Clicks" Daily Activity     Outcome Measure Help from another person eating meals?: None Help from another person taking care of personal grooming?: None Help from another person toileting, which includes using toliet, bedpan, or urinal?: None Help from another person bathing (including washing, rinsing, drying)?: None Help from another person to put on and taking off regular upper body clothing?:  None Help from another person to put on and taking off regular lower body clothing?: None 6 Click Score: 24   End of Session Nurse Communication: Precautions  Activity Tolerance: Patient tolerated treatment well Patient left: in bed  OT Visit Diagnosis: Muscle weakness (generalized) (M62.81)                Time: 1610-9604 OT Time Calculation (min): 20 min Charges:  OT General Charges $OT Visit: 1 Visit OT Evaluation $OT Eval Low Complexity: 1 Low OT Treatments $Therapeutic Exercise: 8-22 mins  11/02/2020  Donne Hazel, MOT, OTR/L   [place] Chiyoko Torrico 11/02/2020, 3:23 PM

## 2020-11-02 NOTE — [facility] Group Notes (Signed)
The focus of this group is to help patients establish daily goals to achieve during treatment and discuss how the patient can incorporate goal setting into their daily lives to aide in recovery.   Patient did not attend group. 

## 2020-11-03 DIAGNOSIS — F1020 Alcohol dependence, uncomplicated: Secondary | ICD-10-CM

## 2020-11-03 MED ORDER — FLUOXETINE HCL 20 MG PO CAPS: 20.0000 mg | ORAL_CAPSULE | Freq: Every day | ORAL | 0 refills | Status: AC

## 2020-11-03 MED ORDER — TRAZODONE HCL 50 MG PO TABS: 50.0000 mg | ORAL_TABLET | Freq: Every evening | ORAL | 0 refills | Status: AC | PRN

## 2020-11-03 MED ORDER — HYDROXYZINE HCL 25 MG PO TABS: 25.0000 mg | ORAL_TABLET | Freq: Three times a day (TID) | ORAL | 0 refills | Status: AC | PRN

## 2020-11-03 MED ORDER — GABAPENTIN 300 MG PO CAPS: 300.0000 mg | ORAL_CAPSULE | Freq: Three times a day (TID) | ORAL | 0 refills | Status: AC
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
## 2020-11-03 NOTE — Progress Notes (Signed)
  [facility] Adult Case Management Discharge Plan :  Will you be returning to the same living situation after discharge:  No. [facility]  At discharge, do you have transportation home?: Yes,  Safe Transport  Do you have the ability to pay for your medications: No.  Release of information consent forms completed and in the chart;  Patient's signature needed at discharge.  Patient to Follow up at:  Follow-up Information    [facility]. Go on 11/13/2020.   Specialty: Behavioral Health Why: You have a walk in appointment for therapy services on 11/13/20 at 7:45 am .  You also have a walk in appointment for medication management on 12/01/20 at 7:45 am.  Walk in appointments are first come, first served and are held in person.  Contact information: [address] [place] [address] [phone]              Next level of care provider has access to [facility] Link:yes  Safety Planning and Suicide Prevention discussed: Yes,  with patient   Have you used any form of tobacco in the last 30 days? (Cigarettes, Smokeless Tobacco, Cigars, and/or Pipes): Yes  Has patient been referred to the Quitline?: Patient refused referral  Patient has been referred for addiction treatment: Pt. refused referral  Aram Beecham, LCSWA 11/03/2020, 9:23 AM

## 2020-11-03 NOTE — Progress Notes (Signed)
Pt discharged to lobby. Pt was stable and appreciative at that time. All papers, samples and prescriptions were given and valuables returned. Verbal understanding expressed. Denies SI/HI and A/VH. Pt given opportunity to express concerns and ask questions.  

## 2020-11-03 NOTE — BHH Group Notes (Signed)
Patent did not attend morning goal and setback in recovery group.

## 2020-11-03 NOTE — Progress Notes (Signed)
Recreation Therapy Notes  Date:  3.11.22 Time: 0930 Location: [address] Dayroom  Group Topic: Stress Management  Goal Area(s) Addresses:  Patient will identify positive stress management techniques. Patient will identify benefits of using stress management post d/c.  Intervention: Stress Management  Activity:  Meditation.  LRT played a meditation that focused on reducing anxiety in stressful situations.  Patients were to listen and focus on the meditation as it played.    Education:  Stress Management, Discharge Planning.   Education Outcome: Acknowledges Education  Clinical Observations/Feedback: Pt did not attend activity.    Caroll Rancher, LRT/CTRS         Lillia Abed, Marjette A 11/03/2020 11:10 AM

## 2020-11-03 NOTE — BHH Suicide Risk Assessment (Signed)
[facility] Discharge Suicide Risk Assessment   Principal Problem: <principal problem not specified> Discharge Diagnoses: Active Problems:   Substance induced mood disorder (HCC)   Total Time spent with patient: 15 minutes  Musculoskeletal: Strength & Muscle Tone: abnormal Gait & Station: normal Patient leans: N/A  Psychiatric Specialty Exam: Review of Systems  Neurological: Positive for weakness and numbness.  All other systems reviewed and are negative.   Blood pressure (!) 129/97, pulse 86, temperature 97.8 F (36.6 C), temperature source Oral, height 5\' 8"  (1.727 m), weight 52.6 kg, SpO2 98 %.Body mass index is 17.64 kg/m.  General Appearance: Fairly Groomed  ::  Fair  Speech:  Normal [id number]  Volume:  Normal  Mood:  Irritable  Affect:  Congruent  Thought Process:  Coherent and Descriptions of Associations: Intact  Orientation:  Full (Time, Place, and Person)  Thought Content:  Logical  Suicidal Thoughts:  No  Homicidal Thoughts:  No  Memory:  Immediate;   Fair Recent;   Fair Remote;   Fair  Judgement:  Fair  Insight:  Fair  Psychomotor Activity:  Normal  Concentration:  Fair  Recall:  [id number] of Knowledge:Fair  Language: Good  Akathisia:  Negative  Handed:  Right  AIMS (if indicated):     Assets:  Desire for Improvement Resilience  Sleep:  Number of Hours: 6.75  Cognition: WNL  ADL's:  Intact   Mental Status Per Nursing Assessment::   On Admission:  Suicidal ideation indicated by patient  Demographic Factors:  Male, Caucasian, Low socioeconomic status, Living alone and Unemployed  Loss Factors: Financial problems/change in socioeconomic status  Historical Factors: Impulsivity  Risk Reduction Factors:   NA  Continued Clinical Symptoms:  Depression:   Comorbid alcohol abuse/dependence Impulsivity Alcohol/Substance Abuse/Dependencies  Cognitive Features That Contribute To Risk:  Thought constriction (tunnel vision)    Suicide Risk:   Minimal: No identifiable suicidal ideation.  Patients presenting with no risk factors but with morbid ruminations; may be classified as minimal risk based on the severity of the depressive symptoms   Follow-up Information    [facility]. Go on 11/13/2020.   Specialty: Behavioral Health Why: You have a walk in appointment for therapy services on 11/13/20 at 7:45 am .  You also have a walk in appointment for medication management on 12/01/20 at 7:45 am.  Walk in appointments are first come, first served and are held in person.  Contact information: [address] [place] [phone]              Plan Of Care/Follow-up recommendations:  Activity:  ad lib  [phone], MD 11/03/2020, 8:21 AM

## 2021-07-02 ENCOUNTER — Inpatient Hospital Stay
Admit: 2021-07-02 | Discharge: 2021-07-09 | Disposition: A | Discharge status: Home / Self Care | Payer: MEDICAID | Attending: Psychiatry | Admitting: Psychiatry

## 2021-07-02 DIAGNOSIS — F332 Major depressive disorder, recurrent severe without psychotic features: Secondary | ICD-10-CM

## 2021-07-02 DIAGNOSIS — F32A Depression, unspecified: Secondary | ICD-10-CM

## 2021-07-02 LAB — CBC WITH AUTOMATED DIFF
ABS. BASOPHILS: 0.1 10*3/uL (ref 0.0–0.1)
ABS. EOSINOPHILS: 0.3 10*3/uL (ref 0.0–0.4)
ABS. IMM. GRANS.: 0 10*3/uL (ref 0.00–0.04)
ABS. LYMPHOCYTES: 1.6 10*3/uL (ref 0.8–3.5)
ABS. MONOCYTES: 0.6 10*3/uL (ref 0.0–1.0)
ABS. NEUTROPHILS: 10.1 10*3/uL — ABNORMAL HIGH (ref 1.8–8.0)
ABSOLUTE NRBC: 0 10*3/uL (ref 0.00–0.01)
BASOPHILS: 1 % (ref 0–1)
EOSINOPHILS: 2 % (ref 0–7)
HCT: 43.9 % (ref 36.6–50.3)
HGB: 15.1 g/dL (ref 12.1–17.0)
IMMATURE GRANULOCYTES: 0 % (ref 0.0–0.5)
LYMPHOCYTES: 12 % (ref 12–49)
MCH: 34 PG (ref 26.0–34.0)
MCHC: 34.4 g/dL (ref 30.0–36.5)
MCV: 98.9 FL (ref 80.0–99.0)
MONOCYTES: 5 % (ref 5–13)
MPV: 9.6 FL (ref 8.9–12.9)
NEUTROPHILS: 80 % — ABNORMAL HIGH (ref 32–75)
NRBC: 0 PER 100 WBC
PLATELET: 249 10*3/uL (ref 150–400)
RBC: 4.44 M/uL (ref 4.10–5.70)
RDW: 12.3 % (ref 11.5–14.5)
WBC: 12.6 10*3/uL — ABNORMAL HIGH (ref 4.1–11.1)

## 2021-07-02 LAB — URINALYSIS W/MICROSCOPIC
Bacteria: NEGATIVE /hpf
Bilirubin: NEGATIVE
Blood: NEGATIVE
Glucose: NEGATIVE mg/dL
Leukocyte Esterase: NEGATIVE
Nitrites: NEGATIVE
Specific gravity: 1.017 (ref 1.003–1.030)
Urobilinogen: 1 EU/dL (ref 0.2–1.0)
pH (UA): 6 (ref 5.0–8.0)

## 2021-07-02 LAB — METABOLIC PANEL, COMPREHENSIVE
A-G Ratio: 0.9 — ABNORMAL LOW (ref 1.1–2.2)
ALT (SGPT): 34 U/L (ref 12–78)
AST (SGOT): 34 U/L (ref 15–37)
Albumin: 3.7 g/dL (ref 3.5–5.0)
Alk. phosphatase: 61 U/L (ref 45–117)
Anion gap: 3 mmol/L — ABNORMAL LOW (ref 5–15)
BUN/Creatinine ratio: 11 — ABNORMAL LOW (ref 12–20)
BUN: 10 MG/DL (ref 6–20)
Bilirubin, total: 0.4 MG/DL (ref 0.2–1.0)
CO2: 32 mmol/L (ref 21–32)
Calcium: 8.8 MG/DL (ref 8.5–10.1)
Chloride: 107 mmol/L (ref 97–108)
Creatinine: 0.89 MG/DL (ref 0.70–1.30)
Globulin: 3.9 g/dL (ref 2.0–4.0)
Glucose: 97 mg/dL (ref 65–100)
Potassium: 3.9 mmol/L (ref 3.5–5.1)
Protein, total: 7.6 g/dL (ref 6.4–8.2)
Sodium: 142 mmol/L (ref 136–145)
eGFR: >60 mL/min/{1.73_m2} (ref >60)

## 2021-07-02 LAB — DRUG SCREEN, URINE
AMPHETAMINES: NEGATIVE
Amphetamine Screen, Urine: NEGATIVE
BARBITURATES: NEGATIVE
BENZODIAZEPINES: NEGATIVE
Barbiturate Screen, Urine: NEGATIVE
Benzodiazepine Screen, Urine: NEGATIVE
COCAINE: NEGATIVE
Cocaine Screen Urine: NEGATIVE
METHADONE: NEGATIVE
Methadone Screen, Urine: NEGATIVE
OPIATES: NEGATIVE
Opiate Screen, Urine: NEGATIVE
PCP Screen, Urine: NEGATIVE
PCP(PHENCYCLIDINE): NEGATIVE
THC (TH-CANNABINOL): NEGATIVE
THC Screen, Urine: NEGATIVE

## 2021-07-02 LAB — ETHYL ALCOHOL
ALCOHOL(ETHYL),SERUM: 199 MG/DL — ABNORMAL HIGH (ref <10)
Ethyl Alcohol: 199 MG/DL — ABNORMAL HIGH (ref <10)

## 2021-07-02 LAB — SALICYLATE
Salicylate level: 1.7 MG/DL — ABNORMAL LOW (ref 2.8–20.0)
Salicylate: 1.7 MG/DL — ABNORMAL LOW (ref 2.8–20.0)

## 2021-07-02 LAB — URINE CULTURE HOLD SAMPLE

## 2021-07-02 LAB — ACETAMINOPHEN: Acetaminophen level: <2 ug/mL — ABNORMAL LOW (ref 10–30)

## 2021-07-02 LAB — URINALYSIS WITH MICROSCOPIC
BACTERIA, URINE: NEGATIVE /hpf
Bilirubin, Urine: NEGATIVE
Blood, Urine: NEGATIVE
Glucose, Ur: NEGATIVE mg/dL
Leukocyte Esterase, Urine: NEGATIVE
Nitrite, Urine: NEGATIVE
Specific Gravity, UA: 1.017 (ref 1.003–1.030)
Urobilinogen, UA, POCT: 1 EU/dL (ref 0.2–1.0)
pH, UA: 6 (ref 5.0–8.0)

## 2021-07-02 LAB — COMPREHENSIVE METABOLIC PANEL
ALT: 34 U/L (ref 12–78)
AST: 34 U/L (ref 15–37)
Albumin/Globulin Ratio: 0.9 — ABNORMAL LOW (ref 1.1–2.2)
Albumin: 3.7 g/dL (ref 3.5–5.0)
Alkaline Phosphatase: 61 U/L (ref 45–117)
Anion Gap: 3 mmol/L — ABNORMAL LOW (ref 5–15)
BUN: 10 MG/DL (ref 6–20)
Bun/Cre Ratio: 11 — ABNORMAL LOW (ref 12–20)
CO2: 32 mmol/L (ref 21–32)
Calcium: 8.8 MG/DL (ref 8.5–10.1)
Chloride: 107 mmol/L (ref 97–108)
Creatinine: 0.89 MG/DL (ref 0.70–1.30)
ESTIMATED GLOMERULAR FILTRATION RATE: >60 mL/min/{1.73_m2} (ref >60)
Globulin: 3.9 g/dL (ref 2.0–4.0)
Glucose: 97 mg/dL (ref 65–100)
Potassium: 3.9 mmol/L (ref 3.5–5.1)
Sodium: 142 mmol/L (ref 136–145)
Total Bilirubin: 0.4 MG/DL (ref 0.2–1.0)
Total Protein: 7.6 g/dL (ref 6.4–8.2)

## 2021-07-02 LAB — CBC WITH AUTO DIFFERENTIAL
Basophils %: 1 % (ref 0–1)
Basophils Absolute: 0.1 10*3/uL (ref 0.0–0.1)
Eosinophils %: 2 % (ref 0–7)
Eosinophils Absolute: 0.3 10*3/uL (ref 0.0–0.4)
Granulocyte Absolute Count: 0 10*3/uL (ref 0.00–0.04)
Hematocrit: 43.9 % (ref 36.6–50.3)
Hemoglobin: 15.1 g/dL (ref 12.1–17.0)
Immature Granulocytes: 0 % (ref 0.0–0.5)
Lymphocytes %: 12 % (ref 12–49)
Lymphocytes Absolute: 1.6 10*3/uL (ref 0.8–3.5)
MCH: 34 PG (ref 26.0–34.0)
MCHC: 34.4 g/dL (ref 30.0–36.5)
MCV: 98.9 FL (ref 80.0–99.0)
MPV: 9.6 FL (ref 8.9–12.9)
Monocytes %: 5 % (ref 5–13)
Monocytes Absolute: 0.6 10*3/uL (ref 0.0–1.0)
NRBC Absolute: 0 10*3/uL (ref 0.00–0.01)
Neutrophils %: 80 % — ABNORMAL HIGH (ref 32–75)
Neutrophils Absolute: 10.1 10*3/uL — ABNORMAL HIGH (ref 1.8–8.0)
Nucleated RBCs: 0 PER 100 WBC
Platelets: 249 10*3/uL (ref 150–400)
RBC: 4.44 M/uL (ref 4.10–5.70)
RDW: 12.3 % (ref 11.5–14.5)
WBC: 12.6 10*3/uL — ABNORMAL HIGH (ref 4.1–11.1)

## 2021-07-02 LAB — ACETAMINOPHEN LEVEL: Acetaminophen Level: <2 ug/mL — ABNORMAL LOW (ref 10–30)
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
## 2021-07-02 NOTE — ED Notes (Signed)
Bedside shift change report given to Arline Asp (Cabin crew) by Tobi Bastos (offgoing nurse). Report included the following information SBAR.

## 2021-07-02 NOTE — ED Notes (Signed)
11:50 PM  Change of shift. Care of patient taken over from Dr. Arvin Collard; H&P reviewed, handoff complete.  Awaiting labs/imaging/consultant.    ED Course as of 07/04/21 1237   Mon Jul 02, 2021   2351 Psychiatric admit for SI.  Possible Dr. Lennox Grumbles admit.  Medically clear.  Will require doxycycline Rx for cellulitis.  Medications ordered in epic for while in the ED [AL]      ED Course User Index  [AL] Bertha Stakes., MD       MEDICATIONS GIVEN:  Medications   diazePAM (VALIUM) tablet 10 mg (has no administration in time range)   diazePAM (VALIUM) tablet 20 mg (has no administration in time range)   acetaminophen (TYLENOL) tablet 1,000 mg (1,000 mg Oral Given 07/02/21 2214)   doxycycline (VIBRA-TABS) tablet 100 mg (has no administration in time range)   doxycycline (VIBRA-TABS) tablet 100 mg (100 mg Oral Given 07/02/21 2231)   lidocaine-EPINEPHrine (XYLOCAINE) 2 %-1:100,000 injection 300 mg (300 mg SubCUTAneous Given by Provider 07/02/21 2231)   diazePAM (VALIUM) tablet 10 mg (10 mg Oral Given 07/02/21 2214)     MDM: Patient with no acute events overnight.  Patient with likely placement to psychiatry in this hospital but final decision has not been made yet.    0700 07/03/2021  Change of shift.  Care of patient signed over to Dr. Zoe Lan.  Handoff complete.      IMPRESSION:  1. Depression, unspecified depression type    2. Alcohol abuse    3. Abscess        DISPOSITION:   BH hold    Drexel Ivey D. Michel Harrow, MD
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
## 2021-07-03 LAB — COVID-19 WITH INFLUENZA A/B
Influenza A By PCR: NOT DETECTED
Influenza A by PCR: NOT DETECTED
Influenza B By PCR: NOT DETECTED
Influenza B by PCR: NOT DETECTED
SARS-CoV-2 by PCR: NOT DETECTED
SARS-CoV-2: NOT DETECTED

## 2021-07-03 MED ORDER — ACETAMINOPHEN 500 MG TAB
500 mg | Freq: Four times a day (QID) | ORAL | Status: AC | PRN
Start: 2021-07-03 — End: 2021-07-04
  Administered 2021-07-03 – 2021-07-04 (×3): via ORAL

## 2021-07-03 MED ORDER — DOXYCYCLINE HYCLATE 100 MG TAB
100 mg | Freq: Two times a day (BID) | ORAL | Status: AC
Start: 2021-07-03 — End: 2021-07-09
  Administered 2021-07-03 – 2021-07-09 (×13): via ORAL

## 2021-07-03 MED ORDER — DOXYCYCLINE HYCLATE 100 MG TAB
100 mg | ORAL | Status: AC
Start: 2021-07-03 — End: 2021-07-02
  Administered 2021-07-03: 04:00:00 via ORAL

## 2021-07-03 MED ORDER — LIDOCAINE-EPINEPHRINE 2 %-1:100,000 IJ SOLN
2 %-1:100,000 | INTRAMUSCULAR | Status: AC
Start: 2021-07-03 — End: 2021-07-02
  Administered 2021-07-03: 04:00:00 via SUBCUTANEOUS

## 2021-07-03 MED ORDER — DIAZEPAM 10 MG TAB
10 mg | ORAL | Status: DC | PRN
Start: 2021-07-03 — End: 2021-07-04
  Administered 2021-07-03: 16:00:00 via ORAL

## 2021-07-03 MED ORDER — DIAZEPAM 10 MG TAB
10 mg | ORAL | Status: DC | PRN
Start: 2021-07-03 — End: 2021-07-04

## 2021-07-03 MED ORDER — DIAZEPAM 5 MG TAB
5 mg | ORAL | Status: AC
Start: 2021-07-03 — End: 2021-07-02
  Administered 2021-07-03: 03:00:00 via ORAL

## 2021-07-03 MED FILL — XYLOCAINE WITH EPINEPHRINE 2 %-1:100,000 INJECTION SOLUTION: 2 %-1:100,000 | INTRAMUSCULAR | Qty: 15

## 2021-07-03 MED FILL — DOXYCYCLINE HYCLATE 100 MG TAB: 100 mg | ORAL | Qty: 1

## 2021-07-03 MED FILL — DIAZEPAM 5 MG TAB: 5 mg | ORAL | Qty: 2

## 2021-07-03 MED FILL — ACETAMINOPHEN 500 MG TAB: 500 mg | ORAL | Qty: 2

## 2021-07-03 NOTE — ED Notes (Signed)
Pt asleep on stretcher.  Sitter remains @ bedside

## 2021-07-03 NOTE — ED Notes (Signed)
Assumed care of patient. Pt resting in position of comfort. Sitter at bedside for 1:1 observation. Report received from Maryjane Hurter RN

## 2021-07-03 NOTE — Consults (Signed)
PSYCHIATRY CONSULT NOTE    REASON FOR CONSULT:Psych hold      HISTORY OF PRESENTING COMPLAINT:  Bradley Calhoun is a 30 y.o. WHITE/NON-HISPANIC male who is currently seen in the ED at [facility]. Patient presented to the ED due to alcohol abuse and suicidal thoughts. Patient is calm and cooperative. He reports having a bad headache and was just recently given tylenol. He states that he has been drinking lately and was having suicidal thoughts with plan to jump into traffic. He reports his stressors as family and "a lot of different things". He reports having " a little bit" of suicidal thoughts with no plan at this time. He also denies homicidal thoughts and VA hallucinations. Patient states that he just came in from [place] to participate in a sober living house. He denies concerns with sleep and appetite.    PAST PSYCHIATRIC HISTORY: Patient reports a hx of inpatient psychiatric admission in [place] and hx of suicide attempt via overdose and must recent few days ago when he attempted to jump into traffic. He denies out patient mental health services.     SUBSTANCE ABUSE HISTORY: Patient reports he drinks 1/5 of vodka daily. When asked about other substance use, he states "not anymore". He describes himself as a drug addict and used to take "everything".    PAST MEDICAL HISTORY:  Please see H&P for details.     Past Medical History:   Diagnosis Date    Bipolar 1 disorder ([place])     ETOH abuse      Prior to Admission medications    Not on File     Vitals:    07/02/21 1629 07/02/21 1836 07/02/21 2208 07/03/21 0900   BP: (!) 138/94 (!) 134/91  (!) 138/94   Pulse: (!) 105 84  74   Resp: '16 18  15   ' Temp: 98.7 ??F (37.1 ??C) 98.6 ??F (37 ??C)  98.3 ??F (36.8 ??C)   SpO2: 96% 98%  97%   Weight:   59 kg (130 lb)    Height:   '5\' 6"'  (1.676 m)      Lab Results   Component Value Date/Time    WBC 12.6 (H) 07/02/2021 04:54 PM    HGB 15.1 07/02/2021 04:54 PM    HCT 43.9 07/02/2021 04:54 PM    PLATELET 249 07/02/2021 04:54  PM    MCV 98.9 07/02/2021 04:54 PM     Lab Results   Component Value Date/Time    Sodium 142 07/02/2021 04:54 PM    Potassium 3.9 07/02/2021 04:54 PM    Chloride 107 07/02/2021 04:54 PM    CO2 32 07/02/2021 04:54 PM    Anion gap 3 (L) 07/02/2021 04:54 PM    Glucose 97 07/02/2021 04:54 PM    BUN 10 07/02/2021 04:54 PM    Creatinine 0.89 07/02/2021 04:54 PM    BUN/Creatinine ratio 11 (L) 07/02/2021 04:54 PM    Calcium 8.8 07/02/2021 04:54 PM    Bilirubin, total 0.4 07/02/2021 04:54 PM    Alk. phosphatase 61 07/02/2021 04:54 PM    Protein, total 7.6 07/02/2021 04:54 PM    Albumin 3.7 07/02/2021 04:54 PM    Globulin 3.9 07/02/2021 04:54 PM    A-G Ratio 0.9 (L) 07/02/2021 04:54 PM    ALT (SGPT) 34 07/02/2021 04:54 PM    AST (SGOT) 34 07/02/2021 04:54 PM     No results found for: VALF2, VALAC, VALP, VALPR, DS6, CRBAM, CRBAMP, CARB2, XCRBAM  No  results found for: LITHM  RADIOLOGY REPORTS:(reviewed/updated 07/03/2021)  No results found.  No results found for: [place], [id number], [id number], [id number], PREGU, POCHCG, MHCGN, HCGQR, THCGA1, SHCG, HCGN, HCGSERUM, HCGURQLPOC    PSYCHOSOCIAL HISTORY:Patient is single and has no child. He states that he works at a [employer] called [employer]. Per chart patient has non violent misdemeanor charges in [place]:  General appearance:  moderately  groomed, psychomotor activity is wnl  Eye contact: good eye contact  Speech: Spontaneous, soft, decreased output.   Affect : Depressed, decreased range  Mood: "I have a bad headache "  Thought Process: Logical, goal directed  Perception: Denies AH or VH.   Thought Content: endorse SI, no plan. Denies HI or Plan  Insight: Partial  Judgement: Fair  Cognition: Intact grossly.     ASSESSMENT AND PLAN:  Bradley Calhoun meets criteria for a diagnosis of  alcohol abuse, MDD.  Will start patient on zoloft 25 mg daily to help with depressed mood.  Continue with CIWA protocol.  Would recommend inpatient psychiatric admission  after patient is medically cleared.      Thank your your consult. Please feel free to consult [place] again as needed.

## 2021-07-03 NOTE — ED Notes (Signed)
Report given to Michelle B RN using sbar

## 2021-07-03 NOTE — Progress Notes (Signed)
Problem: Falls - Risk of  Goal: *Absence of Falls  Description: Document Schmid Fall Risk and appropriate interventions in the flowsheet.  Outcome: Progressing Towards Goal  Note: Fall Risk Interventions:      Medication Interventions: Teach patient to arise slowly    Patient received resting quietly in bed. No signs of distress. Even and unlabored breathing. Staff will continue to monitor safety q15 and provide support.

## 2021-07-03 NOTE — Behavioral Health Treatment Team (Signed)
Admission unit:Acute    Received from: ED    Admission diagnosis:alcohol detox    Admission status: Voluntary     Mood/ affect/ thought process / behaviors: Pt presents as calm and cooperative, restricted affect. Thought processes logical and linear. Severe headache, mild tremor, mild anxiety present. High dose phenobarb taper started per orders. Pt denies current SI/HI/AVH and contracts for safety with RN. Dressing on R chest CDI.     Alcohol/drug: BAL 199 on admission to ED     PTA meds verified: Pt does not take medications currently    PT or consults required: None         Primary Nurse Margot Chimes, RN and Shaun, RN performed a dual skin assessment on this patient Impairment noted- see wound doc flow sheet  Braden score is 23

## 2021-07-03 NOTE — Behavioral Health Treatment Team (Signed)
TRANSFER - IN REPORT:    Verbal report received from Garrett Park, RN on Bradley Calhoun  being received from [facility] ED for routine progression of care      Report consisted of patient's Situation, Background, Assessment and   Recommendations(SBAR).     Information from the following report(s) SBAR, Kardex, ED Summary, MAR, and Recent Results was reviewed with the receiving nurse.    Opportunity for questions and clarification was provided.      Assessment completed upon patient's arrival to unit and care assumed.

## 2021-07-04 MED ORDER — TRAZODONE 50 MG TAB
50 mg | Freq: Every evening | ORAL | Status: DC | PRN
Start: 2021-07-04 — End: 2021-07-05
  Administered 2021-07-04 – 2021-07-05 (×3): via ORAL

## 2021-07-04 MED ORDER — BENZTROPINE 1 MG TAB
1 mg | Freq: Two times a day (BID) | ORAL | Status: DC | PRN
Start: 2021-07-04 — End: 2021-07-09

## 2021-07-04 MED ORDER — THIAMINE HCL 100 MG TAB
100 mg | Freq: Every day | ORAL | Status: DC
Start: 2021-07-04 — End: 2021-07-09
  Administered 2021-07-04 – 2021-07-09 (×5): via ORAL

## 2021-07-04 MED ORDER — PHENOBARBITAL 64.8 MG TAB
64.8 mg | Freq: Four times a day (QID) | ORAL | Status: AC
Start: 2021-07-04 — End: 2021-07-04
  Administered 2021-07-04 (×4): via ORAL

## 2021-07-04 MED ORDER — IBUPROFEN 400 MG TAB
400 mg | Freq: Four times a day (QID) | ORAL | Status: AC | PRN
Start: 2021-07-04 — End: 2021-07-05
  Administered 2021-07-04: 23:00:00 via ORAL

## 2021-07-04 MED ORDER — PHENOBARBITAL 32.4 MG TAB
32.4 mg | Freq: Four times a day (QID) | ORAL | Status: AC | PRN
Start: 2021-07-04 — End: 2021-07-07

## 2021-07-04 MED ORDER — MIDAZOLAM 1 MG/ML IJ SOLN
1 mg/mL | Freq: Four times a day (QID) | INTRAMUSCULAR | Status: DC | PRN
Start: 2021-07-04 — End: 2021-07-09

## 2021-07-04 MED ORDER — HALOPERIDOL LACTATE 5 MG/ML IJ SOLN
5 mg/mL | Freq: Four times a day (QID) | INTRAMUSCULAR | Status: DC | PRN
Start: 2021-07-04 — End: 2021-07-09

## 2021-07-04 MED ORDER — OLANZAPINE 5 MG TAB
5 mg | Freq: Four times a day (QID) | ORAL | Status: DC | PRN
Start: 2021-07-04 — End: 2021-07-09

## 2021-07-04 MED ORDER — PHENOBARBITAL 32.4 MG TAB
32.4 mg | Freq: Two times a day (BID) | ORAL | Status: AC
Start: 2021-07-04 — End: 2021-07-06
  Administered 2021-07-06 (×2): via ORAL

## 2021-07-04 MED ORDER — PHENOBARBITAL 64.8 MG TAB
64.8 mg | Freq: Four times a day (QID) | ORAL | Status: AC | PRN
Start: 2021-07-04 — End: 2021-07-04

## 2021-07-04 MED ORDER — THERAPEUTIC MULTIVITAMIN TAB
Freq: Every day | ORAL | Status: DC
Start: 2021-07-04 — End: 2021-07-09
  Administered 2021-07-04 – 2021-07-09 (×6): via ORAL

## 2021-07-04 MED ORDER — PHENOBARBITAL 32.4 MG TAB
32.4 mg | Freq: Two times a day (BID) | ORAL | Status: AC
Start: 2021-07-04 — End: 2021-07-07
  Administered 2021-07-07 (×2): via ORAL

## 2021-07-04 MED ORDER — PHENOBARBITAL 32.4 MG TAB
32.4 mg | Freq: Four times a day (QID) | ORAL | Status: AC
Start: 2021-07-04 — End: 2021-07-05
  Administered 2021-07-05 (×5): via ORAL

## 2021-07-04 MED ORDER — PHENOBARBITAL 32.4 MG TAB
32.4 mg | Freq: Four times a day (QID) | ORAL | Status: AC | PRN
Start: 2021-07-04 — End: 2021-07-06

## 2021-07-04 MED ORDER — HYDROXYZINE 50 MG TAB
50 mg | Freq: Three times a day (TID) | ORAL | Status: DC | PRN
Start: 2021-07-04 — End: 2021-07-09
  Administered 2021-07-04: 02:00:00 via ORAL

## 2021-07-04 MED ORDER — ACETAMINOPHEN 325 MG TABLET
325 mg | ORAL | Status: DC | PRN
Start: 2021-07-04 — End: 2021-07-05
  Administered 2021-07-04: 21:00:00 via ORAL

## 2021-07-04 MED ORDER — FOLIC ACID 1 MG TAB
1 mg | Freq: Every day | ORAL | Status: DC
Start: 2021-07-04 — End: 2021-07-09
  Administered 2021-07-04 – 2021-07-09 (×6): via ORAL

## 2021-07-04 MED ORDER — DIPHENHYDRAMINE HCL 50 MG/ML IJ SOLN
50 mg/mL | Freq: Two times a day (BID) | INTRAMUSCULAR | Status: DC | PRN
Start: 2021-07-04 — End: 2021-07-09

## 2021-07-04 MED ORDER — NICOTINE 21 MG/24 HR DAILY PATCH
2124 mg/24 hr | TRANSDERMAL | Status: DC
Start: 2021-07-04 — End: 2021-07-06

## 2021-07-04 MED ORDER — MAGNESIUM HYDROXIDE 400 MG/5 ML ORAL SUSP
400 mg/5 mL | Freq: Every day | ORAL | Status: DC | PRN
Start: 2021-07-04 — End: 2021-07-09

## 2021-07-04 MED ORDER — FLUOXETINE 20 MG CAP
20 mg | Freq: Every day | ORAL | Status: AC
Start: 2021-07-04 — End: 2021-07-09
  Administered 2021-07-04 – 2021-07-09 (×6): via ORAL

## 2021-07-04 MED FILL — DIAZEPAM 10 MG TAB: 10 mg | ORAL | Qty: 2

## 2021-07-04 MED FILL — FLUOXETINE 20 MG CAP: 20 mg | ORAL | Qty: 1

## 2021-07-04 MED FILL — ACETAMINOPHEN 325 MG TABLET: 325 mg | ORAL | Qty: 2

## 2021-07-04 MED FILL — NICOTINE 21 MG/24 HR DAILY PATCH: 21 mg/24 hr | TRANSDERMAL | Qty: 1

## 2021-07-04 MED FILL — PHENOBARBITAL 64.8 MG TAB: 64.8 mg | ORAL | Qty: 1

## 2021-07-04 MED FILL — THIAMINE HCL 100 MG TAB: 100 mg | ORAL | Qty: 1

## 2021-07-04 MED FILL — FOLIC ACID 1 MG TAB: 1 mg | ORAL | Qty: 1

## 2021-07-04 MED FILL — TRAZODONE 50 MG TAB: 50 mg | ORAL | Qty: 1

## 2021-07-04 MED FILL — ACETAMINOPHEN 500 MG TAB: 500 mg | ORAL | Qty: 2

## 2021-07-04 MED FILL — DOXYCYCLINE HYCLATE 100 MG TAB: 100 mg | ORAL | Qty: 1

## 2021-07-04 MED FILL — IBUPROFEN 400 MG TAB: 400 mg | ORAL | Qty: 2

## 2021-07-04 MED FILL — THERAPEUTIC MULTIVITAMIN TAB: ORAL | Qty: 1

## 2021-07-04 MED FILL — HYDROXYZINE 50 MG TAB: 50 mg | ORAL | Qty: 1

## 2021-07-04 NOTE — H&P (Signed)
PSYCHIATRY EVALUATION NOTE    CHIEF COMPLAINT:  "Myself gets in the way."    HISTORY OF PRESENTING COMPLAINT:  Bradley Calhoun is a 30 y.o. WHITE/NON-HISPANIC male who is currently admitted to the medical floor at [place] says that he was on the way to the hospital to get his lesion on his chest taken care of, but he ended up relapsing on alcohol. He doesn't recall the rest of the details. Patient describes feeling quite depressed and down. He's been feeling this way for some time as he also struggles with alcohol use. He feels that his depression hasn't been addressed. Bradley Calhoun reports experiencing audio hallucinations and paranoia that interfere with his function as well. He has those symptoms with and without the alcohol.    5-6 days ago relapsed on 1/5 day of vodka. He would drink 2-3 forlocos at night, experiences memory blackout has had 1 withdrawal seizure.   Bradley Calhoun reports losing his job and that his family is not talking with him.    At this time he is experiencing tremors, diaphoresis, headache.that he feels is unrelenting.     PAST PSYCHIATRIC HISTORY   Numerous past inpatient admissions, several detox/dual diagnoses.    SUBSTANCE USE HISTORY:  Alcohol:5-6 days ago relapsed on 1/5 day of vodka. He would drink 2-3 forlocos at night, experiences memory blackout has had 1 withdrawal seizure  Last detox 10/9, then he was sent to [facility] in [place]. He was able to remain 25 days sober. Longest sobriety was 52month.  7-8 past suicide attempts in various ways including hanging, overdoses (w/icu admissions),   Patient recalls having tried naltrexone, topamax, campril.  Recalls being on zyprexa, zoloft, seroquel, trazodone, remeron,   Tobacco: 1PPD  IVD 2 years, used fentanyl, methamphetimnes, off and on for 13years.    PAST MEDICAL HISTORY:  NKDA    Please see H&P for details.     Past Medical History:   Diagnosis Date    Bipolar 1 disorder ([place]     ETOH abuse      Prior to Admission  medications    Not on File     Vitals:    07/03/21 1256 07/03/21 2114 07/03/21 2300 07/04/21 0741   BP: (!) 138/92 (!) 147/97 105/69 115/86   Pulse: 77 75 62 87   Resp: _0 Temp:  97.8 ??F (36.6 ??C)  98.5 ??F (36.9 ??C)   SpO2: 99% 99% 96% 96%   Weight:       Height:         Lab Results   Component Value Date/Time    WBC 12.6 (H) 07/02/2021 04:54 PM    HGB 15.1 07/02/2021 04:54 PM    HCT 43.9 07/02/2021 04:54 PM    PLATELET 249 07/02/2021 04:54 PM    MCV 98.9 07/02/2021 04:54 PM     Lab Results   Component Value Date/Time    Sodium 142 07/02/2021 04:54 PM    Potassium 3.9 07/02/2021 04:54 PM    Chloride 107 07/02/2021 04:54 PM    CO2 32 07/02/2021 04:54 PM    Anion gap 3 (L) 07/02/2021 04:54 PM    Glucose 97 07/02/2021 04:54 PM    BUN 10 07/02/2021 04:54 PM    Creatinine 0.89 07/02/2021 04:54 PM    BUN/Creatinine ratio 11 (L) 07/02/2021 04:54 PM    Calcium 8.8 07/02/2021 04:54 PM    Bilirubin, total 0.4 07/02/2021 04:54 PM  Alk. phosphatase 61 07/02/2021 04:54 PM    Protein, total 7.6 07/02/2021 04:54 PM    Albumin 3.7 07/02/2021 04:54 PM    Globulin 3.9 07/02/2021 04:54 PM    A-G Ratio 0.9 (L) 07/02/2021 04:54 PM    ALT (SGPT) 34 07/02/2021 04:54 PM    AST (SGOT) 34 07/02/2021 04:54 PM     No results found for: LITHM  RADIOLOGY REPORTS:(reviewed/updated 07/04/2021)  No results found.    FAMILY HISTORY:    PSYCHOSOCIAL HISTORY:  MGMA: attempted suicide  Mother, sister, MGM: BP d/o  PGF, Puncle: substance    MENTAL STATUS EXAM:  30yo thin, balding WM has tatoos on his hand and is cooperative with my evaluation.  General appearance:    groomed, psychomotor activity is   Eye contact: Avoids eye contact  Speech: Spontaneous, soft, decreased output.   Mood: "depressed"  Affect : dysthymic.  Thought Process: Logical, goal directed  Recurrent AH, last experienced in the context of withdrawal.  Recurrent SI w/multiple plans but no intent  Insight: poor  Judgement: poor  Cognition: Intact grossly.     ASSESSMENT AND  PLAN:  Bradley Calhoun meets criteria for a diagnosis of   .      Mdd, recurrent, severe, w/o psychosis  Alcohol use d/o    Inpatient psychiatric admission to mitigate risk of suicide and further decompensation.  Will obtain collaterals, labs, and encourage patient to attend groups.  May board for groups.    Discussed starting prozac 10m po qday for depression. Discussed use of antipsychotic. Patient did benefit from zyprexa in the past. At this time he doesn't want to be on it because it may preclude him from being going back to his place shelter.    I certify that this patients inpatient psychiatric hospital services furnished since the previous certification were, and continue to be, required for treatment that could reasonably be expected to improve the patient's condition, or for diagnostic study, and that the patient continues to need, on a daily basis, active treatment furnished directly by or requiring the supervision of inpatient psychiatric facility personnel. In addition, the hospital records show that services furnished were intensive treatment services, admission or related services, or equivalent services.      A coordinated, multidisplinary treatment team round was conducted with the patient, nurses, pharmcist, sCatering managerpresent. Discussions held with case manager, and/or with family members; Complete current electronic health record for patient was reviewed in full including consultant notes, ancillary staff notes, nurses and tech notes, labs and vitals.

## 2021-07-04 NOTE — Progress Notes (Signed)
Problem: Falls - Risk of  Goal: *Absence of Falls  Description: Document Bradley Calhoun Fall Risk and appropriate interventions in the flowsheet.  Outcome: Progressing Towards Goal  Note: Fall Risk Interventions:      Medication Interventions: Teach patient to arise slowly    Problem: Depressed Mood (Adult/Pediatric)  Goal: *STG: Verbalizes anger, guilt, and other feelings in a constructive manor  Outcome: Progressing Towards Goal  Goal: *STG: Remains safe in hospital  Outcome: Progressing Towards Goal  Goal: *STG: Complies with medication therapy  Outcome: Progressing Towards Goal     Problem: Alcohol Withdrawal  Goal: *STG: Vital signs within defined limits  Outcome: Progressing Towards Goal

## 2021-07-04 NOTE — Progress Notes (Signed)
Behavioral Services  Medicare Certification Upon Admission    I certify that this patient's inpatient psychiatric hospital admission is medically necessary for:    [x]  (1) Treatment which could reasonably be expected to improve this patient's condition,       []  (2) Or for diagnostic study;     AND     [x] (2) The inpatient psychiatric services are provided while the individual is under the care of a physician and are included in the individualized plan of care.    Estimated length of stay/service 5-7    Plan for post-hospital care Rehab    Electronically signed by , MD on 07/04/2021 at 11:32 AM

## 2021-07-04 NOTE — Behavioral Health Treatment Team (Signed)
PSYCHOSOCIAL ASSESSMENT  :Patient identifying info:   Bradley Calhoun is a 30 y.o., male admitted 07/02/2021  4:36 PM     Presenting problem and precipitating factors: [facility] [place] to the [place] with the [facility] wanting ETOH detox and having suicidal ideations     Mental status assessment: alert , oriented x's 3 , pleasant and compliant with his treatment team, denies SI,     Strengths/Recreation/Coping Skills:willingness to seek treatment -   Collateral information:   He did not sign a ROI  Current psychiatric /substance abuse providers and contact info:   No current provider   Previous psychiatric/substance abuse providers and response to treatment:   Numerous past inpatient admissions, several detox/dual diagnoses.  Family history of mental illness or substance abuse:   MGMA: attempted suicide  Mother, sister, MGM: BP d/o  PGF, uncle: substance  Substance abuse history:    Social History     Tobacco Use    Smoking status: Every Day     Packs/day: 1.00     Types: Cigarettes    Smokeless tobacco: Not on file   Substance Use Topics    Alcohol use: Not on file       History of biomedical complications associated with substance abuse: he reports history of seizures     Patient's current acceptance of treatment or motivation for change: he is a voluntary patient     Family constellation: single     Is significant other involved?     Describe support system:     Describe living arrangements and home environment: he was living in a sober living home - cannot return  - he is homeless     GUARDIAN/POA: NO    Guardian Name:     Guardian Contact:     Health issues:   Hospital Problems  Never Reviewed            Codes Class Noted POA    Depression, unspecified depression type ICD-10-CM: F32.A  ICD-9-CM: 311  07/02/2021 Unknown        Hx of bipolar disorder ICD-10-CM: Z86.59  ICD-9-CM: V11.1  07/02/2021 Unknown        Alcohol use ICD-10-CM: Z78.9  ICD-9-CM: V49.89  07/02/2021 Unknown           Trauma history: none noted    Legal  issues: he reports some misdemeanor charges in [place]     History of military service: no    Financial status: employed at this time     Religious/cultural factors: none noted     Education/work history: high school education     Have you been licensed as a Clinical cytogeneticist (current or expired): no    Describe coping skills:ineffectual     Carilyn Goodpasture  07/04/2021

## 2021-07-04 NOTE — Progress Notes (Signed)
 Admission Medication Reconciliation:    Information obtained from:  patient interview and VAPMP  RxQuery data available:  NO    Comments/Recommendations: Updated PTA meds/reviewed patient's allergies.    1)  The patient denies taking medications prior to admission but he has been on psychiatric medications previously (olanzapine, sertraline, quetiapine, mirtazapine, trazodone , topiramate, acamprosate, naltrexone). He reports that he never took the medications long enough for them to work (also longest sobriety 2 months).      2)  The [place]  Prescription Monitoring Program (PMP) was assessed to determine fill history of any controlled medications. The patient has filled the following controlled medications in the last  2 years.  - 05/18/21: chlordiazepoxide 25 mg, #6 for 3 day supply    3)  Medication changes (since last review): none   RxQuery pharmacy benefit data reflects medications filled and processed through the patient's insurance, however this data does NOT capture whether the medication was picked up or is currently being taken by the patient.    Allergies:  Patient has no known allergies.    Significant PMH/Disease States:   Past Medical History:   Diagnosis Date    Bipolar 1 disorder (HCC)     ETOH abuse      Chief Complaint for this Admission:    Chief Complaint   Patient presents with    Mental Health Problem    Alcohol Problem    Abscess     Prior to Admission Medications:   None     Isaiah Mainland, [facility]

## 2021-07-04 NOTE — Behavioral Health Treatment Team (Deleted)
GROUP THERAPY PROGRESS NOTE    Bradley Calhoun did not participate in Self Care group.  Due to time constraints and acuity of unit, handouts were left for pts to review individually.

## 2021-07-04 NOTE — Behavioral Health Treatment Team (Deleted)
GROUP THERAPY PROGRESS NOTE    Bradley Calhoun did not participate in Leisure-Creative Group.

## 2021-07-04 NOTE — Progress Notes (Signed)
Pt is calm, cooperative and pleasant.  Demonstrates good insight into his drug and alcohol addiction. He has been isolative to his room d/t not feeling well as he goes thru detox.  Denies S/I, H/I, A/H, V/H.  Med/meal compliant.  No behavior issues.      Problem: Falls - Risk of  Goal: *Absence of Falls  Description: Document Bridgette Habermann Fall Risk and appropriate interventions in the flowsheet.  Outcome: Progressing Towards Goal  Note: Fall Risk Interventions:  Medication Interventions: Teach patient to arise slowly  Problem: Patient Education: Go to Patient Education Activity  Goal: Patient/Family Education  Outcome: Progressing Towards Goal  Problem: Anxiety  Goal: *Alleviation of anxiety  Outcome: Progressing Towards Goal  Problem: Depressed Mood (Adult/Pediatric)  Goal: *STG: Remains safe in hospital  Outcome: Progressing Towards Goal  Problem: Alcohol Withdrawal  Goal: *STG: Remains safe in hospital  Outcome: Progressing Towards Goal  Goal: *STG: Seeks staff when symptoms of withdrawal increase  Outcome: Progressing Towards Goal

## 2021-07-05 MED ORDER — TRAZODONE 100 MG TAB
100 mg | Freq: Every evening | ORAL | Status: DC | PRN
Start: 2021-07-05 — End: 2021-07-07
  Administered 2021-07-06 – 2021-07-07 (×2): via ORAL

## 2021-07-05 MED ORDER — PHENOBARBITAL 32.4 MG TAB
32.4 mg | Freq: Once | ORAL | Status: AC
Start: 2021-07-05 — End: 2021-07-05
  Administered 2021-07-05: 20:00:00 via ORAL

## 2021-07-05 MED ORDER — IBUPROFEN 400 MG TAB
400 mg | Freq: Four times a day (QID) | ORAL | Status: DC | PRN
Start: 2021-07-05 — End: 2021-07-09
  Administered 2021-07-05 – 2021-07-09 (×7): via ORAL

## 2021-07-05 MED ORDER — ACETAMINOPHEN 325 MG TABLET
325 mg | ORAL | Status: DC | PRN
Start: 2021-07-05 — End: 2021-07-09
  Administered 2021-07-05 – 2021-07-09 (×10): via ORAL

## 2021-07-05 MED FILL — FOLIC ACID 1 MG TAB: 1 mg | ORAL | Qty: 1

## 2021-07-05 MED FILL — FLUOXETINE 20 MG CAP: 20 mg | ORAL | Qty: 1

## 2021-07-05 MED FILL — DOXYCYCLINE HYCLATE 100 MG TAB: 100 mg | ORAL | Qty: 1

## 2021-07-05 MED FILL — IBUPROFEN 400 MG TAB: 400 mg | ORAL | Qty: 2

## 2021-07-05 MED FILL — PHENOBARBITAL 32.4 MG TAB: 32.4 mg | ORAL | Qty: 1

## 2021-07-05 MED FILL — THERAPEUTIC MULTIVITAMIN TAB: ORAL | Qty: 1

## 2021-07-05 MED FILL — TRAZODONE 50 MG TAB: 50 mg | ORAL | Qty: 1

## 2021-07-05 MED FILL — THIAMINE HCL 100 MG TAB: 100 mg | ORAL | Qty: 1

## 2021-07-05 MED FILL — ACETAMINOPHEN 325 MG TABLET: 325 mg | ORAL | Qty: 2

## 2021-07-05 MED FILL — NICOTINE 21 MG/24 HR DAILY PATCH: 21 mg/24 hr | TRANSDERMAL | Qty: 1

## 2021-07-05 NOTE — Behavioral Health Treatment Team (Signed)
GROUP THERAPY PROGRESS NOTE    Patient did not participate in Healthy Living and Wellness group.    Katherine Gillies, MSW, QMHP-A

## 2021-07-05 NOTE — Progress Notes (Signed)
Problem: Depressed Mood (Adult/Pediatric)  Goal: *STG: Participates in 1:1 therapy sessions  Outcome: Progressing Towards Goal     Problem: Depressed Mood (Adult/Pediatric)  Goal: *STG: Verbalizes anger, guilt, and other feelings in a constructive manor  Outcome: Progressing Towards Goal

## 2021-07-05 NOTE — Behavioral Health Treatment Team (Signed)
Wound Note    This Clinical research associate entered an order for a would care consult, because the wound on the patient's R chest has a deep part that looks like it might need to be packed.

## 2021-07-05 NOTE — Progress Notes (Signed)
Problem: Depressed Mood (Adult/Pediatric)  Goal: *STG: Complies with medication therapy  Outcome: Progressing Towards Goal

## 2021-07-05 NOTE — Progress Notes (Signed)
Spirituality Group      Meeting Topic: Methods of Spiritual Pathways    Meeting Time:  45-60 minutes    Meeting Goal: Patients will discuss various approaches to making spiritual connections. Participants will be invited to explore a new spiritual practice.     Today meeting focus:Spiritual connections through guided visualization    Jess Toney attended and participated in the group appropriately respective of their current diagnosis.        For additional spiritual care, please contact the chaplain on-call at [phone]).    Rev. Jacobo Forest, MDiv, MS, [facility]  Staff Chaplain

## 2021-07-05 NOTE — Progress Notes (Signed)
Spiritual Care Assessment/Progress Note  [facility]      NAME: Bradley Calhoun      MRN: [id number]  AGE: 30 y.o. SEX: male  Religious Affiliation: No religion   Language: English     07/05/2021     Total Time (in minutes): 14     Spiritual Assessment begun in [facility] AC[facility] through conversation with:         [x] Patient        []  Family    []  Friend(s)        Reason for Consult: Initial/Spiritual assessment, patient floor     Spiritual beliefs: (Please include comment if needed)     []  Identifies with a faith tradition:         []  Supported by a faith community:            [x]  Claims no spiritual orientation:           []  Seeking spiritual identity:                []  Adheres to an individual form of spirituality:           []  Not able to assess:                           Identified resources for coping:      []  Prayer                               []  Music                  []  Guided Imagery     []  Family/friends                 []  Pet visits     []  Devotional reading                         [x]  Unknown     []  Other:                                               Interventions offered during this visit: (See comments for more details)    Patient Interventions: Catharsis/review of pertinent events in supportive environment, Affirmation of emotions/emotional suffering           Plan of Care:     [x]  Support spiritual and/or cultural needs    []  Support AMD and/or advance care planning process      []  Support grieving process   []  Coordinate Rites and/or Rituals    []  Coordination with community clergy   []  No spiritual needs identified at this time   []  Detailed Plan of Care below (See Comments)  []  Make referral to Music Therapy  []  Make referral to Pet Therapy     []  Make referral to Addiction services  []  Make referral to [facility] Passages  []  Make referral to Spiritual Care Partner  []  No future visits requested        []  Contact Spiritual Care for further referrals     Comments: Referral source:  Chaplain initiated visit as a result of Marcin Holte length of stay [facility] AC[facility]. I reviewed  the patient's medical record prior to this encounter.     Assessment: Uncertain about the future due to current condition    [name] shared spiritual background and the reasons for this admission. He reports having a strained connection with God due to the deaths of his son, and other important family members. He fins AA meetings helpful for coping and prayer sometimes. I cultivated a relationship of support through expressions of care and concern; explored spiritual needs, hopes and related resources. I offered words of encouragement and blessing for continued healing.    Outcome:  Toure Edmonds expressed appreciation for our meeting    Plan of Care: No further spiritual needs were identified during this visit.    Please contact Spiritual Care services for any additional needs ([phone])    _________________________________________  Rev. Jacobo Forest, Staff Chaplain, MDiv, MS, [facility]

## 2021-07-05 NOTE — Behavioral Health Treatment Team (Signed)
PRN Medication Documentation    Specific patient behavior that led to need for PRN medication: headache    Staff interventions attempted prior to PRN being given: rest,   PRN medication given: Motrin 800 mg  Patient response/effectiveness of PRN medication: lttle effect     PRN Medication Documentation  @1235   Specific patient behavior that led to need for PRN medication: headache  Staff interventions attempted prior to PRN being given: medication  PRN medication given: tylenol  Patient response/effectiveness of PRN medication: slight improvement

## 2021-07-05 NOTE — Behavioral Health Treatment Team (Signed)
Chief Complaint:  "I just have a headache."    Length of Stay: 3 Days    Interval History:  Nursing report that patient is eating his meals, took prns for HA.    This morning patient reports that he didn't sleep well at all overnight and that he is still having a nagging headache that is tightening around his temples. No visual changes reported, no nausea.  He is still experiencing significant diaphoresis at night and poor sleep. His appetite and mood are fair.    Past Medical History:  Past Medical History:   Diagnosis Date    Bipolar 1 disorder ([place])     ETOH abuse          Labs:  Lab Results   Component Value Date/Time    WBC 12.6 (H) 07/02/2021 04:54 PM    HGB 15.1 07/02/2021 04:54 PM    HCT 43.9 07/02/2021 04:54 PM    PLATELET 249 07/02/2021 04:54 PM    MCV 98.9 07/02/2021 04:54 PM      Lab Results   Component Value Date/Time    Sodium 142 07/02/2021 04:54 PM    Potassium 3.9 07/02/2021 04:54 PM    Chloride 107 07/02/2021 04:54 PM    CO2 32 07/02/2021 04:54 PM    Anion gap 3 (L) 07/02/2021 04:54 PM    Glucose 97 07/02/2021 04:54 PM    BUN 10 07/02/2021 04:54 PM    Creatinine 0.89 07/02/2021 04:54 PM    BUN/Creatinine ratio 11 (L) 07/02/2021 04:54 PM    Calcium 8.8 07/02/2021 04:54 PM    Bilirubin, total 0.4 07/02/2021 04:54 PM    Alk. phosphatase 61 07/02/2021 04:54 PM    Protein, total 7.6 07/02/2021 04:54 PM    Albumin 3.7 07/02/2021 04:54 PM    Globulin 3.9 07/02/2021 04:54 PM    A-G Ratio 0.9 (L) 07/02/2021 04:54 PM    ALT (SGPT) 34 07/02/2021 04:54 PM      Vitals:    07/04/21 1223 07/04/21 1540 07/04/21 1930 07/05/21 0726   BP: 125/85 (!) 130/92 108/67 120/81   Pulse: 90 90 75 (!) 59   Resp: _0 Temp: 98.4 ??F (36.9 ??C) 98.4 ??F (36.9 ??C) 97.6 ??F (36.4 ??C) 98.5 ??F (36.9 ??C)   SpO2: 98% 97% 96% 96%   Weight:       Height:             Current Facility-Administered Medications   Medication Dose Route Frequency Provider Last Rate Last Admin    ibuprofen (MOTRIN) tablet 800 mg  800 mg Oral Q6H PRN  Payden Docter R, MD   800 mg at 07/05/21 0840    acetaminophen (TYLENOL) tablet 650 mg  650 mg Oral Q4H PRN Karyssa Amaral R, MD        nicotine (NICODERM CQ) 21 mg/24 hr patch 1 Patch  1 Patch TransDERmal Q24H Daaiel Starlin R, MD   1 Patch at 07/04/21 1221    FLUoxetine (PROzac) capsule 20 mg  20 mg Oral DAILY Janyia Guion R, MD   20 mg at 07/05/21 0839    OLANZapine (ZyPREXA) tablet 5 mg  5 mg Oral Q6H PRN Omer Jack, NP        haloperidol lactate (HALDOL) injection 5 mg  5 mg IntraMUSCular Q6H PRN Basir, Merrie Roof, NP        benztropine (COGENTIN) tablet 1 mg  1 mg Oral BID PRN Omer Jack, NP  diphenhydrAMINE (BENADRYL) injection 50 mg  50 mg IntraMUSCular BID PRN Omer Jack, NP        hydrOXYzine HCL (ATARAX) tablet 50 mg  50 mg Oral TID PRN Omer Jack, NP   50 mg at 07/03/21 2122    traZODone (DESYREL) tablet 50 mg  50 mg Oral QHS PRN Omer Jack, NP   50 mg at 07/04/21 2051    magnesium hydroxide (MILK OF MAGNESIA) 400 mg/5 mL oral suspension 30 mL  30 mL Oral DAILY PRN Omer Jack, NP        midazolam (VERSED) injection 2 mg  2 mg IntraMUSCular Q6H PRN Omer Jack, NP        PHENobarbitaL tablet 32.4 mg  32.4 mg Oral QID Omer Jack, NP   32.4 mg at 07/05/21 9450    Followed by    Derrill Memo ON 07/06/2021] PHENobarbitaL tablet 32.4 mg  32.4 mg Oral BID Omer Jack, NP        Followed by    Derrill Memo ON 07/07/2021] PHENobarbitaL tablet 16.2 mg  16.2 mg Oral BID Omer Jack, NP        PHENobarbitaL tablet 32.4 mg  32.4 mg Oral Q6H PRN Omer Jack, NP        Followed by    Derrill Memo ON 07/06/2021] PHENobarbitaL tablet 16.2 mg  16.2 mg Oral Q6H PRN Omer Jack, NP        thiamine HCL (B-1) tablet 100 mg  100 mg Oral DAILY Basir, Nasr, NP   100 mg at 38/88/28 0034    folic acid (FOLVITE) tablet 1 mg  1 mg Oral DAILY Omer Jack, NP   1 mg at 07/05/21 9179    therapeutic multivitamin (THERAGRAN) tablet 1 Tablet  1 Tablet Oral DAILY Omer Jack, NP   1 Tablet at 07/05/21 0838    doxycycline (VIBRA-TABS) tablet 100 mg  100 mg  Oral Q12H Lutricia Horsfall A, MD   100 mg at 07/05/21 1505     Mental Status Exam:  30yo WM is thin, has several tattoos is dressed in hospital gown.   Speech is spontaneous  Mood is "not great.'  Affect: dysthymic and congruent  No SI  No AVH  Cognition is grossly intact    Physical Exam:  Body habitus: Body mass index is 20.98 kg/m??.  Musculoskeletal system: normal gait  Tremor - neg  Cog wheeling - neg    Assessment and Plan:  Bradley Calhoun meets criteria for a diagnosis of      Mdd, recurrent, severe, w/o psychosis  Alcohol use d/o    Provide one time dose of phenobarb 31m.  Increase trazodone from535mpo qhs to 10034mo qhs to promote better sleep in the context of treatment for alcohol withdrawal  Disposition planning to continue.   Anticipate discharge next week    A coordinated, multidisplinary treatment team round was conducted with the patient, nurses, pharmcist, socCatering manageresent. Discussions held with case manager, and/or with family members; Complete current electronic health record for patient was reviewed in full including consultant notes, ancillary staff notes, nurses and tech notes, labs and vitals.    I certify that this patients inpatient psychiatric hospital services furnished since the previous certification were, and continue to be, required for treatment that could reasonably be expected to improve the patient's condition, or for diagnostic study, and that the patient continues to need, on a daily basis, active treatment furnished directly by or requiring the supervision of inpatient psychiatric facility  personnel. In addition, the hospital records show that services furnished were intensive treatment services, admission or related services, or equivalent services.

## 2021-07-05 NOTE — Progress Notes (Signed)
[place] [employer]  Master Treatment Plan for Bradley Calhoun    Date Treatment Plan Initiated: 07/05/21    Treatment Plan Modalities:  Type of Modality Amount  (x minutes) Frequency (x/week) Duration (x days) Name of Responsible Staff   Community & wrap-up meetings to encourage peer interactions 15 7 1  ASTHA BHT     Group psychotherapy to assist in building coping skills and internal controls 60 7 1 Harvest Forest MSW   Therapeutic activity groups to build coping skills 60 7 1 Harvest Forest MSW   Psychoeducation in group setting to address:   Medication education   15 7 1  Dannielle Karvonen PharmD   Coping skills   30 3 1  Shawn Caswell Corwin, Chinita Pester Scott   Relaxation techniques      Sherene Sires RN    Symptom management      STAFF   Discharge planning   60 2 1 Rexene Edison   Spirituality    60 2 1 Chaplain    NAMI   60 1 1 volunteer   Conservation officer, historic buildings   Physician medication management   15 7 1  Dr Angelina Sheriff NP   Family meeting/discharge planning   15 2 1  Lanae Crumbly                                    Problem: Depressed Mood (Adult/Pediatric)  Goal: *STG: Attends activities and groups  Outcome: Not Progressing Towards Goal  TENDS TO ISOLATE   Goal: *STG: Demonstrates reduction in symptoms and increase in insight into coping skills/future focused  Outcome: Not Progressing Towards Goal  ETOH DETOX PROTOCOL  Goal: *STG: Remains safe in hospital  Outcome: Progressing Towards Goal  Pt denies any suicidal or homicidal thoughts. Contracts for safety. Remains on q 15 min safety checks.

## 2021-07-05 NOTE — Behavioral Health Treatment Team (Signed)
GROUP THERAPY PROGRESS NOTE    Patient is participating in psychotherapy group.    Group time: 60 minutes    Personal goal for participation: To gain an understanding Rumination and Worry     Goal orientation: Personal    Group therapy participation: Active     Therapeutic interventions reviewed and discussed:  Group members were guided through understanding the differences between worry and rumination. Members gained an idea of the effects of overthinking. Members were supported in identifying reoccurring or intrusive thoughts and applying different strategies to adjust their thought patterns. Handouts provided.     Impression of participation:  Pt was present and engaged in group discussion. Pt added insight to group topic. Pt was supportive of peers. Pt was calm, cooperative.       Karie Kirks, MSW, QMHP-A

## 2021-07-06 MED ORDER — COLLAGENASE 250 UNIT/G OINTMENT
250 unit/gram | Freq: Every day | CUTANEOUS | Status: AC
Start: 2021-07-06 — End: 2021-07-09
  Administered 2021-07-07 – 2021-07-09 (×2): via TOPICAL

## 2021-07-06 MED ORDER — NICOTINE (POLACRILEX) 2 MG BUCCAL LOZENGE
2 mg | BUCCAL | Status: AC | PRN
Start: 2021-07-06 — End: 2021-07-09
  Administered 2021-07-06 – 2021-07-09 (×9): via ORAL

## 2021-07-06 MED FILL — FLUOXETINE 20 MG CAP: 20 mg | ORAL | Qty: 1

## 2021-07-06 MED FILL — DOXYCYCLINE HYCLATE 100 MG TAB: 100 mg | ORAL | Qty: 1

## 2021-07-06 MED FILL — PHENOBARBITAL 32.4 MG TAB: 32.4 mg | ORAL | Qty: 1

## 2021-07-06 MED FILL — IBUPROFEN 400 MG TAB: 400 mg | ORAL | Qty: 2

## 2021-07-06 MED FILL — THERAPEUTIC MULTIVITAMIN TAB: ORAL | Qty: 1

## 2021-07-06 MED FILL — THIAMINE HCL 100 MG TAB: 100 mg | ORAL | Qty: 1

## 2021-07-06 MED FILL — TRAZODONE 100 MG TAB: 100 mg | ORAL | Qty: 1

## 2021-07-06 MED FILL — NICOTINE (POLACRILEX) 2 MG BUCCAL LOZENGE: 2 mg | BUCCAL | Qty: 1

## 2021-07-06 MED FILL — ACETAMINOPHEN 325 MG TABLET: 325 mg | ORAL | Qty: 2

## 2021-07-06 MED FILL — NICOTINE 21 MG/24 HR DAILY PATCH: 21 mg/24 hr | TRANSDERMAL | Qty: 1

## 2021-07-06 MED FILL — FOLIC ACID 1 MG TAB: 1 mg | ORAL | Qty: 1

## 2021-07-06 MED FILL — SANTYL 250 UNIT/GRAM TOPICAL OINTMENT: 250 unit/gram | CUTANEOUS | Qty: 30

## 2021-07-06 NOTE — Progress Notes (Signed)
Patient resting in bed with eyes closed. No complaints or distress noted. Safety measures in place. Will continue to monitor q15 minutes.    Problem: Falls - Risk of  Goal: *Absence of Falls  Description: Document Bridgette Habermann Fall Risk and appropriate interventions in the flowsheet.  Outcome: Progressing Towards Goal  Note: Fall Risk Interventions:            Medication Interventions: Teach patient to arise slowly    Elimination Interventions: Toilet paper/wipes in reach

## 2021-07-06 NOTE — Behavioral Health Treatment Team (Signed)
GROUP THERAPY PROGRESS NOTE    Patient did not participate in recreational therapy group.    Katherine Gillies, MSW, QMHP-A

## 2021-07-06 NOTE — Behavioral Health Treatment Team (Signed)
GROUP THERAPY PROGRESS NOTE    Patient did not participate in Healthy Living and Wellness group.    Katherine Gillies, MSW, QMHP-A

## 2021-07-06 NOTE — Progress Notes (Signed)
Problem: Depressed Mood (Adult/Pediatric)  Goal: *STG: Participates in treatment plan  Outcome: Progressing Towards Goal     Problem: Depressed Mood (Adult/Pediatric)  Goal: *STG: Participates in 1:1 therapy sessions  Outcome: Progressing Towards Goal

## 2021-07-06 NOTE — Behavioral Health Treatment Team (Signed)
Chief Complaint:  "Starting to feel better."    Length of Stay: 4 Days    Interval History:  Nursing report that patient is eating his meals and sleeping.    Today he says that he feels no withdrawal symptoms and his mood is improved.  He still has a headache and pain in his chest wound.    Past Medical History:  Past Medical History:   Diagnosis Date    Bipolar 1 disorder ([place])     ETOH abuse          Labs:  Lab Results   Component Value Date/Time    WBC 12.6 (H) 07/02/2021 04:54 PM    HGB 15.1 07/02/2021 04:54 PM    HCT 43.9 07/02/2021 04:54 PM    PLATELET 249 07/02/2021 04:54 PM    MCV 98.9 07/02/2021 04:54 PM      Lab Results   Component Value Date/Time    Sodium 142 07/02/2021 04:54 PM    Potassium 3.9 07/02/2021 04:54 PM    Chloride 107 07/02/2021 04:54 PM    CO2 32 07/02/2021 04:54 PM    Anion gap 3 (L) 07/02/2021 04:54 PM    Glucose 97 07/02/2021 04:54 PM    BUN 10 07/02/2021 04:54 PM    Creatinine 0.89 07/02/2021 04:54 PM    BUN/Creatinine ratio 11 (L) 07/02/2021 04:54 PM    Calcium 8.8 07/02/2021 04:54 PM    Bilirubin, total 0.4 07/02/2021 04:54 PM    Alk. phosphatase 61 07/02/2021 04:54 PM    Protein, total 7.6 07/02/2021 04:54 PM    Albumin 3.7 07/02/2021 04:54 PM    Globulin 3.9 07/02/2021 04:54 PM    A-G Ratio 0.9 (L) 07/02/2021 04:54 PM    ALT (SGPT) 34 07/02/2021 04:54 PM      Vitals:    07/05/21 0726 07/05/21 1703 07/06/21 0732 07/06/21 0912   BP: 120/81 (!) 145/80 118/77 127/84   Pulse: (!) 59 73 82 92   Resp: '16 16 16 16   '$ Temp: 98.5 ??F (36.9 ??C) 98 ??F (36.7 ??C) 98.1 ??F (36.7 ??C) 98.2 ??F (36.8 ??C)   SpO2: 96%  94% 97%   Weight:       Height:             Current Facility-Administered Medications   Medication Dose Route Frequency Provider Last Rate Last Admin    collagenase (SANTYL) 250 unit/gram ointment   Topical DAILY Aubryn Spinola R, MD        ibuprofen (MOTRIN) tablet 800 mg  800 mg Oral Q6H PRN Yeimy Brabant R, MD   800 mg at 07/06/21 1110    acetaminophen (TYLENOL) tablet 650 mg  650 mg Oral  Q4H PRN Camielle Sizer R, MD   650 mg at 07/06/21 1324    traZODone (DESYREL) tablet 100 mg  100 mg Oral QHS PRN Vinicius Brockman R, MD   100 mg at 07/05/21 2005    nicotine (NICODERM CQ) 21 mg/24 hr patch 1 Patch  1 Patch TransDERmal Q24H Foye Haggart R, MD   1 Patch at 07/06/21 1110    FLUoxetine (PROzac) capsule 20 mg  20 mg Oral DAILY Avital Dancy R, MD   20 mg at 07/06/21 0859    OLANZapine (ZyPREXA) tablet 5 mg  5 mg Oral Q6H PRN Omer Jack, NP        haloperidol lactate (HALDOL) injection 5 mg  5 mg IntraMUSCular Q6H PRN Omer Jack, NP  benztropine (COGENTIN) tablet 1 mg  1 mg Oral BID PRN Omer Jack, NP        diphenhydrAMINE (BENADRYL) injection 50 mg  50 mg IntraMUSCular BID PRN Omer Jack, NP        hydrOXYzine HCL (ATARAX) tablet 50 mg  50 mg Oral TID PRN Omer Jack, NP   50 mg at 07/03/21 2122    magnesium hydroxide (MILK OF MAGNESIA) 400 mg/5 mL oral suspension 30 mL  30 mL Oral DAILY PRN Omer Jack, NP        midazolam (VERSED) injection 2 mg  2 mg IntraMUSCular Q6H PRN Omer Jack, NP        PHENobarbitaL tablet 32.4 mg  32.4 mg Oral BID Omer Jack, NP   32.4 mg at 07/06/21 0859    Followed by    Derrill Memo ON 07/07/2021] PHENobarbitaL tablet 16.2 mg  16.2 mg Oral BID Omer Jack, NP        PHENobarbitaL tablet 32.4 mg  32.4 mg Oral Q6H PRN Omer Jack, NP        Followed by    PHENobarbitaL tablet 16.2 mg  16.2 mg Oral Q6H PRN Omer Jack, NP        thiamine HCL (B-1) tablet 100 mg  100 mg Oral DAILY Basir, Nasr, NP   100 mg at 78/29/56 2130    folic acid (FOLVITE) tablet 1 mg  1 mg Oral DAILY Omer Jack, NP   1 mg at 07/06/21 0859    therapeutic multivitamin (THERAGRAN) tablet 1 Tablet  1 Tablet Oral DAILY Omer Jack, NP   1 Tablet at 07/06/21 0859    doxycycline (VIBRA-TABS) tablet 100 mg  100 mg Oral Q12H Lutricia Horsfall A, MD   100 mg at 07/06/21 0859     Mental Status Exam:  30yo WM is thin, has several tattoos is dressed in hospital gown.   Speech is spontaneous  Mood is "better.'  Affect:  dysthymic and congruent  No SI  No AVH  Cognition is grossly intact    Physical Exam:  Body habitus: Body mass index is 20.98 kg/m??.  Musculoskeletal system: normal gait  Tremor - neg  Cog wheeling - neg    Assessment and Plan:  Deke Tilghman meets criteria for a diagnosis of      Mdd, recurrent, severe, w/o psychosis  Alcohol use d/o    Discussed continuing current medications  Expect d/c on monday    11/10  Provide one time dose of phenobarb 27m.  Increase trazodone from547mpo qhs to 10032mo qhs to promote better sleep in the context of treatment for alcohol withdrawal  Disposition planning to continue.   Anticipate discharge next week    A coordinated, multidisplinary treatment team round was conducted with the patient, nurses, pharmcist, socCatering manageresent. Discussions held with case manager, and/or with family members; Complete current electronic health record for patient was reviewed in full including consultant notes, ancillary staff notes, nurses and tech notes, labs and vitals.    I certify that this patients inpatient psychiatric hospital services furnished since the previous certification were, and continue to be, required for treatment that could reasonably be expected to improve the patient's condition, or for diagnostic study, and that the patient continues to need, on a daily basis, active treatment furnished directly by or requiring the supervision of inpatient psychiatric facility personnel. In addition, the hospital records show that services furnished were intensive treatment services, admission or related services, or equivalent  services.

## 2021-07-06 NOTE — Behavioral Health Treatment Team (Signed)
Wound Note    Patient showered and dressing was changed after shower by this Clinical research associate. Wound was cleaned with NS, and NS soaked packing placed in wound, covered by bandage, as ordered by wound care nurse.    Santyl ointment was not applied since it was not available. Santyl now available and placed in patient bin.    Patient educated to keep dressing on in shower to keep wound from getting wet.

## 2021-07-06 NOTE — Progress Notes (Signed)
/  Pediatric)Goal: *STG: Participates in treatment plan  Outcome: Progressing Towards Goal  Willl attend and participate  Goal: *STG: Verbalizes anger, guilt, and other feelings in a constructive manor  Outcome: Progressing Towards Goal  Is articulate and expressive  Goal: *STG: Complies with medication therapy  Outcome: Progressing Towards Goal   Medication compliant denies side effects

## 2021-07-06 NOTE — Progress Notes (Signed)
Problem: Falls - Risk of  Goal: *Absence of Falls  Description: Document Bradley Calhoun Fall Risk and appropriate interventions in the flowsheet.  Outcome: Progressing Towards Goal  Note:   Patient received resting quietly in bed. No signs of distress. Even and unlabored breathing. Staff will continue to monitor safety q15 and provide support.        Fall Risk Interventions:              Medication Interventions: Teach patient to arise slowly    Elimination Interventions: Toilet paper/wipes in reach

## 2021-07-06 NOTE — Progress Notes (Signed)
 Problem: Discharge Planning  Goal: *Discharge to safe environment  Outcome: Progressing Towards Goal  Goal: *Knowledge of medication management  Outcome: Progressing Towards Goal  Goal: *Knowledge of discharge instructions  Outcome: Progressing Towards Goal

## 2021-07-07 MED ORDER — TRAZODONE 50 MG TAB
50 mg | Freq: Every evening | ORAL | Status: DC | PRN
Start: 2021-07-07 — End: 2021-07-09
  Administered 2021-07-08 – 2021-07-09 (×2): via ORAL

## 2021-07-07 MED FILL — NICOTINE (POLACRILEX) 2 MG BUCCAL LOZENGE: 2 mg | BUCCAL | Qty: 1

## 2021-07-07 MED FILL — TRAZODONE 100 MG TAB: 100 mg | ORAL | Qty: 1

## 2021-07-07 MED FILL — IBUPROFEN 400 MG TAB: 400 mg | ORAL | Qty: 2

## 2021-07-07 MED FILL — DOXYCYCLINE HYCLATE 100 MG TAB: 100 mg | ORAL | Qty: 1

## 2021-07-07 MED FILL — FOLIC ACID 1 MG TAB: 1 mg | ORAL | Qty: 1

## 2021-07-07 MED FILL — FLUOXETINE 20 MG CAP: 20 mg | ORAL | Qty: 1

## 2021-07-07 MED FILL — ACETAMINOPHEN 325 MG TABLET: 325 mg | ORAL | Qty: 2

## 2021-07-07 MED FILL — PHENOBARBITAL 32.4 MG TAB: 32.4 mg | ORAL | Qty: 1

## 2021-07-07 MED FILL — THIAMINE HCL 100 MG TAB: 100 mg | ORAL | Qty: 1

## 2021-07-07 MED FILL — THERAPEUTIC MULTIVITAMIN TAB: ORAL | Qty: 1

## 2021-07-07 NOTE — Behavioral Health Treatment Team (Signed)
Wound care completed as ordered by wound care nurse. The hole part of the wound is looking shallower, and the superficial part of the wound has less pus in it.

## 2021-07-07 NOTE — Behavioral Health Treatment Team (Signed)
Chief Complaint: "I feel great."      Length of Stay: 5 Days    Interval History:  Nursing reports that he has been calm and cooperative on the unit.  He reports he did not sleep all that well last night.  He states he can get to sleep but is having a hard time staying asleep.  He reports mood is good.  He denies any withdrawals or cravings currently.  He plans on returning to his sober living house upon discharge.  Denies SI/HI/AVH      Past Medical History:  Past Medical History:   Diagnosis Date    Bipolar 1 disorder ([place])     ETOH abuse          Labs:  Lab Results   Component Value Date/Time    WBC 12.6 (H) 07/02/2021 04:54 PM    HGB 15.1 07/02/2021 04:54 PM    HCT 43.9 07/02/2021 04:54 PM    PLATELET 249 07/02/2021 04:54 PM    MCV 98.9 07/02/2021 04:54 PM      Lab Results   Component Value Date/Time    Sodium 142 07/02/2021 04:54 PM    Potassium 3.9 07/02/2021 04:54 PM    Chloride 107 07/02/2021 04:54 PM    CO2 32 07/02/2021 04:54 PM    Anion gap 3 (L) 07/02/2021 04:54 PM    Glucose 97 07/02/2021 04:54 PM    BUN 10 07/02/2021 04:54 PM    Creatinine 0.89 07/02/2021 04:54 PM    BUN/Creatinine ratio 11 (L) 07/02/2021 04:54 PM    Calcium 8.8 07/02/2021 04:54 PM    Bilirubin, total 0.4 07/02/2021 04:54 PM    Alk. phosphatase 61 07/02/2021 04:54 PM    Protein, total 7.6 07/02/2021 04:54 PM    Albumin 3.7 07/02/2021 04:54 PM    Globulin 3.9 07/02/2021 04:54 PM    A-G Ratio 0.9 (L) 07/02/2021 04:54 PM    ALT (SGPT) 34 07/02/2021 04:54 PM      Vitals:    07/06/21 0732 07/06/21 0912 07/06/21 1513 07/07/21 0742   BP: 118/77 127/84 119/76 112/69   Pulse: 82 92 86 80   Resp: '16 16 16 14   ' Temp: 98.1 ??F (36.7 ??C) 98.2 ??F (36.8 ??C) 98.5 ??F (36.9 ??C) 98.2 ??F (36.8 ??C)   SpO2: 94% 97% 96% 96%   Weight:       Height:             Current Facility-Administered Medications   Medication Dose Route Frequency Provider Last Rate Last Admin    collagenase (SANTYL) 250 unit/gram ointment   Topical DAILY Zanoun, Norva Karvonen, MD   Given at  07/07/21 0834    nicotine buccal (POLACRILEX) lozenge 2 mg  2 mg Oral Q2H PRN Epstein, Steffanie A, NP   2 mg at 07/07/21 0834    ibuprofen (MOTRIN) tablet 800 mg  800 mg Oral Q6H PRN Zanoun, Rami R, MD   800 mg at 07/06/21 1648    acetaminophen (TYLENOL) tablet 650 mg  650 mg Oral Q4H PRN Zanoun, Rami R, MD   650 mg at 07/07/21 0833    traZODone (DESYREL) tablet 100 mg  100 mg Oral QHS PRN Zanoun, Rami R, MD   100 mg at 07/06/21 2011    FLUoxetine (PROzac) capsule 20 mg  20 mg Oral DAILY Zanoun, Rami R, MD   20 mg at 07/07/21 0833    OLANZapine (ZyPREXA) tablet 5 mg  5 mg Oral Q6H PRN Omer Jack,  NP        haloperidol lactate (HALDOL) injection 5 mg  5 mg IntraMUSCular Q6H PRN Omer Jack, NP        benztropine (COGENTIN) tablet 1 mg  1 mg Oral BID PRN Omer Jack, NP        diphenhydrAMINE (BENADRYL) injection 50 mg  50 mg IntraMUSCular BID PRN Omer Jack, NP        hydrOXYzine HCL (ATARAX) tablet 50 mg  50 mg Oral TID PRN Omer Jack, NP   50 mg at 07/03/21 2122    magnesium hydroxide (MILK OF MAGNESIA) 400 mg/5 mL oral suspension 30 mL  30 mL Oral DAILY PRN Omer Jack, NP        midazolam (VERSED) injection 2 mg  2 mg IntraMUSCular Q6H PRN Omer Jack, NP        PHENobarbitaL tablet 16.2 mg  16.2 mg Oral BID Omer Jack, NP   16.2 mg at 07/07/21 7371    PHENobarbitaL tablet 16.2 mg  16.2 mg Oral Q6H PRN Omer Jack, NP        thiamine HCL (B-1) tablet 100 mg  100 mg Oral DAILY Basir, Merrie Roof, NP   100 mg at 02/18/93 8546    folic acid (FOLVITE) tablet 1 mg  1 mg Oral DAILY Omer Jack, NP   1 mg at 07/07/21 2703    therapeutic multivitamin (THERAGRAN) tablet 1 Tablet  1 Tablet Oral DAILY Omer Jack, NP   1 Tablet at 07/07/21 5009    doxycycline (VIBRA-TABS) tablet 100 mg  100 mg Oral Q12H Lutricia Horsfall A, MD   100 mg at 07/07/21 3818     Mental Status Exam:  30yo WM is thin, has several tattoos is dressed in hospital gown.   Speech is spontaneous  Mood is "better.'  Affect: dysthymic and congruent  No SI  No  AVH  Cognition is grossly intact    Physical Exam:  Body habitus: Body mass index is 20.98 kg/m??.  Musculoskeletal system: normal gait  Tremor - neg  Cog wheeling - neg    Assessment and Plan:  Hamdan Toscano meets criteria for a diagnosis of      Mdd, recurrent, severe, w/o psychosis  Alcohol use d/o    Complete phenobarb taper today  Increase PRN Trazodone to 150 mg nightly  Expect d/c on monday    11/10  Provide one time dose of phenobarb 69m.  Increase trazodone from564mpo qhs to 10034mo qhs to promote better sleep in the context of treatment for alcohol withdrawal  Disposition planning to continue.   Anticipate discharge next week    A coordinated, multidisplinary treatment team round was conducted with the patient, nurses, pharmcist, socCatering manageresent. Discussions held with case manager, and/or with family members; Complete current electronic health record for patient was reviewed in full including consultant notes, ancillary staff notes, nurses and tech notes, labs and vitals.    I certify that this patients inpatient psychiatric hospital services furnished since the previous certification were, and continue to be, required for treatment that could reasonably be expected to improve the patient's condition, or for diagnostic study, and that the patient continues to need, on a daily basis, active treatment furnished directly by or requiring the supervision of inpatient psychiatric facility personnel. In addition, the hospital records show that services furnished were intensive treatment services, admission or related services, or equivalent services.

## 2021-07-08 MED FILL — DOXYCYCLINE HYCLATE 100 MG TAB: 100 mg | ORAL | Qty: 1

## 2021-07-08 MED FILL — FLUOXETINE 20 MG CAP: 20 mg | ORAL | Qty: 1

## 2021-07-08 MED FILL — NICOTINE (POLACRILEX) 2 MG BUCCAL LOZENGE: 2 mg | BUCCAL | Qty: 1

## 2021-07-08 MED FILL — TRAZODONE 50 MG TAB: 50 mg | ORAL | Qty: 1

## 2021-07-08 MED FILL — THERAPEUTIC MULTIVITAMIN TAB: ORAL | Qty: 1

## 2021-07-08 MED FILL — ACETAMINOPHEN 325 MG TABLET: 325 mg | ORAL | Qty: 2

## 2021-07-08 MED FILL — FOLIC ACID 1 MG TAB: 1 mg | ORAL | Qty: 1

## 2021-07-08 MED FILL — THIAMINE HCL 100 MG TAB: 100 mg | ORAL | Qty: 1

## 2021-07-08 MED FILL — IBUPROFEN 400 MG TAB: 400 mg | ORAL | Qty: 2

## 2021-07-08 NOTE — Behavioral Health Treatment Team (Signed)
Chief Complaint: "I am great."      Length of Stay: 6 Days    Interval History:  Pt states he did not sleep any better with the increase of Trazodone last night.  He is not concerned about this once he leaves inpatient though as he states he sleeps fine when he is working.  He is eating well.  Mood is good with no complaints.  No withdrawals or cravings. Denies SI/HI/AVH.      Past Medical History:  Past Medical History:   Diagnosis Date    Bipolar 1 disorder ([place])     ETOH abuse          Labs:  Lab Results   Component Value Date/Time    WBC 12.6 (H) 07/02/2021 04:54 PM    HGB 15.1 07/02/2021 04:54 PM    HCT 43.9 07/02/2021 04:54 PM    PLATELET 249 07/02/2021 04:54 PM    MCV 98.9 07/02/2021 04:54 PM      Lab Results   Component Value Date/Time    Sodium 142 07/02/2021 04:54 PM    Potassium 3.9 07/02/2021 04:54 PM    Chloride 107 07/02/2021 04:54 PM    CO2 32 07/02/2021 04:54 PM    Anion gap 3 (L) 07/02/2021 04:54 PM    Glucose 97 07/02/2021 04:54 PM    BUN 10 07/02/2021 04:54 PM    Creatinine 0.89 07/02/2021 04:54 PM    BUN/Creatinine ratio 11 (L) 07/02/2021 04:54 PM    Calcium 8.8 07/02/2021 04:54 PM    Bilirubin, total 0.4 07/02/2021 04:54 PM    Alk. phosphatase 61 07/02/2021 04:54 PM    Protein, total 7.6 07/02/2021 04:54 PM    Albumin 3.7 07/02/2021 04:54 PM    Globulin 3.9 07/02/2021 04:54 PM    A-G Ratio 0.9 (L) 07/02/2021 04:54 PM    ALT (SGPT) 34 07/02/2021 04:54 PM      Vitals:    07/06/21 1513 07/07/21 0742 07/07/21 1544 07/08/21 0724   BP: 119/76 112/69 135/87 106/67   Pulse: 86 80 67 78   Resp: '16 14 14 16   ' Temp: 98.5 ??F (36.9 ??C) 98.2 ??F (36.8 ??C) 98.2 ??F (36.8 ??C) 98.3 ??F (36.8 ??C)   SpO2: 96% 96% 98% 94%   Weight:    59.5 kg (131 lb 3.2 oz)   Height:             Current Facility-Administered Medications   Medication Dose Route Frequency Provider Last Rate Last Admin    traZODone (DESYREL) tablet 150 mg  150 mg Oral QHS PRN Ladene Artist A, NP   150 mg at 07/07/21 2013    collagenase (SANTYL) 250  unit/gram ointment   Topical DAILY Marigene Ehlers, MD   Given at 07/07/21 0834    nicotine buccal (POLACRILEX) lozenge 2 mg  2 mg Oral Q2H PRN Thamas Jaegers A, NP   2 mg at 07/07/21 1834    ibuprofen (MOTRIN) tablet 800 mg  800 mg Oral Q6H PRN Zanoun, Rami R, MD   800 mg at 07/07/21 1344    acetaminophen (TYLENOL) tablet 650 mg  650 mg Oral Q4H PRN Zanoun, Rami R, MD   650 mg at 07/08/21 0924    FLUoxetine (PROzac) capsule 20 mg  20 mg Oral DAILY Zanoun, Rami R, MD   20 mg at 07/08/21 0922    OLANZapine (ZyPREXA) tablet 5 mg  5 mg Oral Q6H PRN Omer Jack, NP  haloperidol lactate (HALDOL) injection 5 mg  5 mg IntraMUSCular Q6H PRN Omer Jack, NP        benztropine (COGENTIN) tablet 1 mg  1 mg Oral BID PRN Omer Jack, NP        diphenhydrAMINE (BENADRYL) injection 50 mg  50 mg IntraMUSCular BID PRN Omer Jack, NP        hydrOXYzine HCL (ATARAX) tablet 50 mg  50 mg Oral TID PRN Omer Jack, NP   50 mg at 07/03/21 2122    magnesium hydroxide (MILK OF MAGNESIA) 400 mg/5 mL oral suspension 30 mL  30 mL Oral DAILY PRN Omer Jack, NP        midazolam (VERSED) injection 2 mg  2 mg IntraMUSCular Q6H PRN Omer Jack, NP        thiamine HCL (B-1) tablet 100 mg  100 mg Oral DAILY Basir, Merrie Roof, NP   100 mg at 95/18/84 1660    folic acid (FOLVITE) tablet 1 mg  1 mg Oral DAILY Omer Jack, NP   1 mg at 07/08/21 6301    therapeutic multivitamin (THERAGRAN) tablet 1 Tablet  1 Tablet Oral DAILY Omer Jack, NP   1 Tablet at 07/08/21 6010    doxycycline (VIBRA-TABS) tablet 100 mg  100 mg Oral Q12H Lutricia Horsfall A, MD   100 mg at 07/08/21 9323     Mental Status Exam:  30yo WM is thin, has several tattoos is dressed in hospital gown.   Speech is spontaneous  Mood is "better.'  Affect: dysthymic and congruent  No SI  No AVH  Cognition is grossly intact    Physical Exam:  Body habitus: Body mass index is 21.18 kg/m??.  Musculoskeletal system: normal gait  Tremor - neg  Cog wheeling - neg    Assessment and Plan:  Bradley Calhoun meets criteria for a diagnosis of      Mdd, recurrent, severe, w/o psychosis  Alcohol use d/o    Continue current medication regimen at this time  Expect d/c on monday    11/10  Provide one time dose of phenobarb 59m.  Increase trazodone from524mpo qhs to 10047mo qhs to promote better sleep in the context of treatment for alcohol withdrawal  Disposition planning to continue.   Anticipate discharge next week    A coordinated, multidisplinary treatment team round was conducted with the patient, nurses, pharmcist, socCatering manageresent. Discussions held with case manager, and/or with family members; Complete current electronic health record for patient was reviewed in full including consultant notes, ancillary staff notes, nurses and tech notes, labs and vitals.    I certify that this patients inpatient psychiatric hospital services furnished since the previous certification were, and continue to be, required for treatment that could reasonably be expected to improve the patient's condition, or for diagnostic study, and that the patient continues to need, on a daily basis, active treatment furnished directly by or requiring the supervision of inpatient psychiatric facility personnel. In addition, the hospital records show that services furnished were intensive treatment services, admission or related services, or equivalent services.

## 2021-07-08 NOTE — Progress Notes (Signed)
Problem: Falls - Risk of  Goal: *Absence of Falls  Description: Document Bradley Calhoun Fall Risk and appropriate interventions in the flowsheet.  Outcome: Progressing Towards Goal  Note: Fall Risk Interventions:     Medication Interventions: Teach patient to arise slowly    Elimination Interventions: Toilet paper/wipes in reach      Patient received resting quietly in bed. No signs of distress. Even and unlabored breathing. Staff will continue to monitor safety q15 and provide support.

## 2021-07-08 NOTE — Progress Notes (Signed)
Problem: Depressed Mood (Adult/Pediatric)  Goal: *STG: Participates in treatment plan  Outcome: Progressing Towards Goal  Goal: *STG: Verbalizes anger, guilt, and other feelings in a constructive manor  Outcome: Progressing Towards Goal  Goal: *STG: Attends activities and groups  Outcome: Progressing Towards Goal  Goal: *STG: Remains safe in hospital  Outcome: Progressing Towards Goal

## 2021-07-09 MED ORDER — TRAZODONE 150 MG TAB
150 mg | ORAL_TABLET | Freq: Every evening | ORAL | 0 refills | Status: AC | PRN
Start: 2021-07-09 — End: 2021-08-08
  Filled 2021-07-09: qty 30, 30d supply, fill #0

## 2021-07-09 MED ORDER — COLLAGENASE 250 UNIT/G OINTMENT
250 unit/gram | Freq: Every day | CUTANEOUS | 0 refills | Status: AC
Start: 2021-07-09 — End: 2021-07-20

## 2021-07-09 MED ORDER — FLUOXETINE 20 MG CAP
20 mg | ORAL_CAPSULE | Freq: Every day | ORAL | 0 refills | Status: AC
Start: 2021-07-09 — End: 2021-08-09
  Filled 2021-07-09: qty 30, 30d supply, fill #0

## 2021-07-09 MED FILL — FOLIC ACID 1 MG TAB: 1 mg | ORAL | Qty: 1

## 2021-07-09 MED FILL — IBUPROFEN 400 MG TAB: 400 mg | ORAL | Qty: 2

## 2021-07-09 MED FILL — TRAZODONE 50 MG TAB: 50 mg | ORAL | Qty: 1

## 2021-07-09 MED FILL — FLUOXETINE 20 MG CAP: 20 mg | ORAL | Qty: 1

## 2021-07-09 MED FILL — THERAPEUTIC MULTIVITAMIN TAB: ORAL | Qty: 1

## 2021-07-09 MED FILL — ACETAMINOPHEN 325 MG TABLET: 325 mg | ORAL | Qty: 2

## 2021-07-09 MED FILL — NICOTINE (POLACRILEX) 2 MG BUCCAL LOZENGE: 2 mg | BUCCAL | Qty: 1

## 2021-07-09 MED FILL — DOXYCYCLINE HYCLATE 100 MG TAB: 100 mg | ORAL | Qty: 1

## 2021-07-09 MED FILL — THIAMINE HCL 100 MG TAB: 100 mg | ORAL | Qty: 1

## 2021-07-09 NOTE — Progress Notes (Signed)
Problem: Discharge Planning  Goal: *Discharge to safe environment  Outcome: Progressing Towards Goal  Plan is discharge to home  Goal: *Knowledge of medication management  Outcome: Progressing Towards Goal  Medication compliant reports understanding  Goal: *Knowledge of discharge instructions  Outcome: Progressing Towards Goal  Verbalizes understanding         DISCHARGE SUMMARY from Nurse    PATIENT INSTRUCTIONS:  What to do at Home:  Recommended activity: Activity as tolerated,   .    *  Please give a list of your current medications to your Primary Care Provider.    *  Please update this list whenever your medications are discontinued, doses are      changed, or new medications (including over-the-counter products) are added.    *  Please carry medication information at all times in case of emergency situations.    These are general instructions for a healthy lifestyle:    No smoking/ No tobacco products/ Avoid exposure to second hand smoke  Surgeon General's Warning:  Quitting smoking now greatly reduces serious risk to your health.    Obesity, smoking, and sedentary lifestyle greatly increases your risk for illness    A healthy diet, regular physical exercise & weight monitoring are important for maintaining a healthy lifestyle    You may be retaining fluid if you have a history of heart failure or if you experience any of the following symptoms:  Weight gain of 3 pounds or more overnight or 5 pounds in a week, increased swelling in our hands or feet or shortness of breath while lying flat in bed.  Please call your doctor as soon as you notice any of these symptoms; do not wait until your next office visit.        The discharge information has been reviewed with the patient.  The patient verbalized understanding.  Discharge medications reviewed with the patient and appropriate educational materials and side effects teaching were provided.Prescriptions filled and given to patient.Transportation to be provided  by brother.  ___________________________________________________________________________________________________________________________________

## 2021-07-09 NOTE — Discharge Summary (Signed)
DISCHARGE SUMMARY    Some parts of the discharge summary are from the initial Psychiatric interview that was done on admission by the admitting psychiatrist.      Date of Admission: 07/02/2021    Date of Discharge:07/09/2021     TYPE OF DISCHARGE:   REGULAR -  YES    ADMISSION EVALUATION:  CHIEF COMPLAINT:  "Myself gets in the way."     HISTORY OF PRESENTING COMPLAINT:  Bradley Calhoun is a 30 y.o. WHITE/NON-HISPANIC male who is currently admitted to the medical floor at [facility] says that he was on the way to the hospital to get his lesion on his chest taken care of, but he ended up relapsing on alcohol. He doesn't recall the rest of the details. Patient describes feeling quite depressed and down. He's been feeling this way for some time as he also struggles with alcohol use. He feels that his depression hasn't been addressed. Bradley Calhoun reports experiencing audio hallucinations and paranoia that interfere with his function as well. He has those symptoms with and without the alcohol.     5-6 days ago relapsed on 1/5 day of vodka. He would drink 2-3 forlocos at night, experiences memory blackout has had 1 withdrawal seizure.   Bradley Calhoun reports losing his job and that his family is not talking with him.     At this time he is experiencing tremors, diaphoresis, headache.that he feels is unrelenting.      PAST PSYCHIATRIC HISTORY   Numerous past inpatient admissions, several detox/dual diagnoses.     SUBSTANCE USE HISTORY:  Alcohol:5-6 days ago relapsed on 1/5 day of vodka. He would drink 2-3 forlocos at night, experiences memory blackout has had 1 withdrawal seizure  Last detox 10/9, then he was sent to [facility] in [place]. He was able to remain 25 days sober. Longest sobriety was 26month.  7-8 past suicide attempts in various ways including hanging, overdoses (w/[facility] admissions),   Patient recalls having tried naltrexone, topamax, campril.  Recalls being on zyprexa, zoloft, seroquel, trazodone, remeron,    Tobacco: 1PPD  IVD 2 years, used fentanyl, methamphetimnes, off and on for 13years.     PAST MEDICAL HISTORY:  NKDA     Please see H&P for details.           Past Medical History:   Diagnosis Date    Bipolar 1 disorder ([place]      ETOH abuse        Prior to Admission medications    Not on File             Vitals:     07/03/21 1256 07/03/21 2114 07/03/21 2300 07/04/21 0741   BP: (!) 138/92 (!) 147/97 105/69 115/86   Pulse: 77 75 62 87   Resp: '16 14 16 16   ' Temp:   97.8 ??F (36.6 ??C)   98.5 ??F (36.9 ??C)   SpO2: 99% 99% 96% 96%   Weight:           Height:                    Lab Results   Component Value Date/Time     WBC 12.6 (H) 07/02/2021 04:54 PM     HGB 15.1 07/02/2021 04:54 PM     HCT 43.9 07/02/2021 04:54 PM     PLATELET 249 07/02/2021 04:54 PM     MCV 98.9 07/02/2021 04:54 PM  Lab Results   Component Value Date/Time     Sodium 142 07/02/2021 04:54 PM     Potassium 3.9 07/02/2021 04:54 PM     Chloride 107 07/02/2021 04:54 PM     CO2 32 07/02/2021 04:54 PM     Anion gap 3 (L) 07/02/2021 04:54 PM     Glucose 97 07/02/2021 04:54 PM     BUN 10 07/02/2021 04:54 PM     Creatinine 0.89 07/02/2021 04:54 PM     BUN/Creatinine ratio 11 (L) 07/02/2021 04:54 PM     Calcium 8.8 07/02/2021 04:54 PM     Bilirubin, total 0.4 07/02/2021 04:54 PM     Alk. phosphatase 61 07/02/2021 04:54 PM     Protein, total 7.6 07/02/2021 04:54 PM     Albumin 3.7 07/02/2021 04:54 PM     Globulin 3.9 07/02/2021 04:54 PM     A-G Ratio 0.9 (L) 07/02/2021 04:54 PM     ALT (SGPT) 34 07/02/2021 04:54 PM     AST (SGOT) 34 07/02/2021 04:54 PM      No results found for: LITHM  RADIOLOGY REPORTS:(reviewed/updated 07/04/2021)  No results found.     FAMILY HISTORY:     PSYCHOSOCIAL HISTORY:  MGMA: attempted suicide  Mother, sister, MGM: BP d/o  PGF, Puncle: substance     MENTAL STATUS EXAM:  30yo thin, balding WM has tatoos on his hand and is cooperative with my evaluation.  General appearance:    groomed, psychomotor activity is   Eye contact:  Avoids eye contact  Speech: Spontaneous, soft, decreased output.   Mood: "depressed"  Affect : dysthymic.  Thought Process: Logical, goal directed  Recurrent AH, last experienced in the context of withdrawal.  Recurrent SI w/multiple plans but no intent  Insight: poor  Judgement: poor  Cognition: Intact grossly.      ASSESSMENT AND PLAN:  Bradley Calhoun meets criteria for a diagnosis of   .       Mdd, recurrent, severe, w/o psychosis  Alcohol use d/o    Course in the Hospital:     Patient was admitted to the inpatient psychiatry unit for acute psychiatric stabilization in regards to symptomatology as described in the HPI above and placed on Q15 minute checks and withdrawal precautions. While on the unit Bradley Calhoun was involved in individual, group, occupational and milieu therapy.     He was started on phenobarb CIWA and tolerated that well for alcohol withdrawal. We started him on prozac 66m and trazodone 157mpo qhs.  He finished 7 days of AB regimen.    He was quite on the unit, appropriate in his interactions, and cooperative with medications and the unit routine. Please see individual progress notes for more specific details regarding patient's hospitalization course. Patient was discharged as per the plan. He had been doing well on the unit as per the report of the nursing staff and my observations. No PRN medication for agitation, seclusion or restraints were required during the last 48 hours of his stay. Bradley Calhoun improved progressively to the point of being stable for discharge and outpatient FU. At this time he did not offer any complaints. Patient denied any SI or HI. Denied any AH or VH. He denied any delusions. Was not considered a danger to self or to others and is safe for discharge. Will FU with his appointments and remains motivated to be in treatment. The patient verbalized understanding of his discharge instructions.  DISCHARGE DIAGNOSIS:  Mdd, recurrent, severe, w/o  psychosis  Alcohol use d/o    There are no discharge medications for this patient.     No results found for: VALF2, VALAC, VALP, VALPR, DS6, CRBAM, CRBAMP, CARB2, XCRBAM  No results found for: LITHM  Follow-up Information       Follow up With Specialties Details Why Contact Info    [facility] on 07/09/2021 for intake/assessment to set up counseling and medication management. [place]  [phone]          WOUND CARE: none needed.    PROGNOSIS:   Good / Fair based on nature of patient's pathology/ies and treatment compliance issues.  Prognosis is greatly dependent upon patient's ability to  follow up on psychiatric/psychotherapy appointments as well as to comply with psychiatric medications as prescribed.

## 2021-07-09 NOTE — Progress Notes (Signed)
Pharmacist Discharge Medication Reconciliation    Discharging Provider: Dr. Lennox Grumbles    Significant PMH:   Past Medical History:   Diagnosis Date    Bipolar 1 disorder ([facility])     ETOH abuse      Chief Complaint for this Admission:   Chief Complaint   Patient presents with    Mental Health Problem    Alcohol Problem    Abscess     Allergies: Patient has no known allergies.    Discharge Medications:   Current Discharge Medication List        START taking these medications    Details   FLUoxetine (PROzac) 20 mg capsule Take 1 Capsule by mouth daily for 30 days. Indications: major depressive disorder  Qty: 30 Capsule, Refills: 0  Start date: 07/10/2021, End date: 08/09/2021      traZODone (DESYREL) 150 mg tablet Take 1 Tablet by mouth nightly as needed for Sleep (For insomnia) for up to 30 days. Indications: insomnia associated with depression  Qty: 30 Tablet, Refills: 0  Start date: 07/09/2021, End date: 08/08/2021      collagenase (SANTYL) 250 unit/gram ointment Apply 1 mg to affected area daily for 10 days. Indications: a skin ulcer  Qty: 15 g, Refills: 0  Start date: 07/10/2021, End date: 07/20/2021             The patient's chart, MAR and AVS were reviewed by Ramonita Lab, PHARMD.

## 2021-07-09 NOTE — Behavioral Health Treatment Team (Signed)
 Behavioral Health Transition Record to Provider    Patient Name: Bradley Calhoun  Date of Birth: 1991-07-14  Medical Record Number: [id number]  Date of Admission: 07/02/2021  Date of Discharge: 07/09/2021     Attending Provider: No att. providers found  Discharging Provider: Dr. Kennith   To contact this individual call [phone] and ask the operator to page.  If unavailable, ask to be transferred to [facility] Provider on call.  A Behavioral Health Provider will be available on call 24/7 and during holidays.    Primary Care Provider: Unknown, Provider, MD    No Known Allergies    Reason for Admission: CHIEF COMPLAINT:  Myself gets in the way.     HISTORY OF PRESENTING COMPLAINT:  Bradley Calhoun is a 30 y.o. WHITE/NON-HISPANIC male who is currently admitted to the medical floor at [facility].      Bradley Calhoun says that he was on the way to the hospital to get his lesion on his chest taken care of, but he ended up relapsing on alcohol. He doesn't recall the rest of the details. Patient describes feeling quite depressed and down. He's been feeling this way for some time as he also struggles with alcohol use. He feels that his depression hasn't been addressed. Bradley Calhoun reports experiencing audio hallucinations and paranoia that interfere with his function as well. He has those symptoms with and without the alcohol.     5-6 days ago relapsed on 1/5 day of vodka. He would drink 2-3 forlocos at night, experiences memory blackout has had 1 withdrawal seizure.   Bradley Calhoun reports losing his job and that his family is not talking with him.     At this time he is experiencing tremors, diaphoresis, headache.that he feels is unrelenting.        Admission Diagnosis: Depression, unspecified depression type [F32.A]  Alcohol use [Z78.9]  Hx of bipolar disorder [Z86.59]    * No surgery found *    Results for orders placed or performed during the hospital encounter of 07/02/21   URINE CULTURE HOLD SAMPLE    Specimen: Serum;  Urine   Result Value Ref Range    Urine culture hold        Urine on hold in Microbiology dept for 2 days.  If unpreserved urine is submitted, it cannot be used for addtional testing after 24 hours, recollection will be required.   COVID-19 WITH INFLUENZA A/B   Result Value Ref Range    SARS-CoV-2 by PCR Not detected NOTD      Influenza A by PCR Not detected NOTD      Influenza B by PCR Not detected NOTD     CBC WITH AUTOMATED DIFF   Result Value Ref Range    WBC 12.6 (H) 4.1 - 11.1 K/uL    RBC 4.44 4.10 - 5.70 M/uL    HGB 15.1 12.1 - 17.0 g/dL    HCT 56.0 63.3 - 49.6 %    MCV 98.9 80.0 - 99.0 FL    MCH 34.0 26.0 - 34.0 PG    MCHC 34.4 30.0 - 36.5 g/dL    RDW 87.6 88.4 - 85.4 %    PLATELET 249 150 - 400 K/uL    MPV 9.6 8.9 - 12.9 FL    NRBC 0.0 0 PER 100 WBC    ABSOLUTE NRBC 0.00 0.00 - 0.01 K/uL    NEUTROPHILS 80 (H) 32 - 75 %    LYMPHOCYTES 12 12 - 49 %  MONOCYTES 5 5 - 13 %    EOSINOPHILS 2 0 - 7 %    BASOPHILS 1 0 - 1 %    IMMATURE GRANULOCYTES 0 0.0 - 0.5 %    ABS. NEUTROPHILS 10.1 (H) 1.8 - 8.0 K/UL    ABS. LYMPHOCYTES 1.6 0.8 - 3.5 K/UL    ABS. MONOCYTES 0.6 0.0 - 1.0 K/UL    ABS. EOSINOPHILS 0.3 0.0 - 0.4 K/UL    ABS. BASOPHILS 0.1 0.0 - 0.1 K/UL    ABS. IMM. GRANS. 0.0 0.00 - 0.04 K/UL    DF AUTOMATED     METABOLIC PANEL, COMPREHENSIVE   Result Value Ref Range    Sodium 142 136 - 145 mmol/L    Potassium 3.9 3.5 - 5.1 mmol/L    Chloride 107 97 - 108 mmol/L    CO2 32 21 - 32 mmol/L    Anion gap 3 (L) 5 - 15 mmol/L    Glucose 97 65 - 100 mg/dL    BUN 10 6 - 20 MG/DL    Creatinine 9.10 9.29 - 1.30 MG/DL    BUN/Creatinine ratio 11 (L) 12 - 20      eGFR >60 >60 ml/min/1.46m2    Calcium  8.8 8.5 - 10.1 MG/DL    Bilirubin, total 0.4 0.2 - 1.0 MG/DL    ALT (SGPT) 34 12 - 78 U/L    AST (SGOT) 34 15 - 37 U/L    Alk. phosphatase 61 45 - 117 U/L    Protein, total 7.6 6.4 - 8.2 g/dL    Albumin 3.7 3.5 - 5.0 g/dL    Globulin 3.9 2.0 - 4.0 g/dL    A-G Ratio 0.9 (L) 1.1 - 2.2     ETHYL ALCOHOL   Result Value Ref Range     ALCOHOL(ETHYL),SERUM 199 (H) <10 MG/DL   URINALYSIS W/MICROSCOPIC   Result Value Ref Range    Color YELLOW/STRAW      Appearance CLEAR CLEAR      Specific gravity 1.017 1.003 - 1.030      pH (UA) 6.0 5.0 - 8.0      Protein TRACE (A) NEG mg/dL    Glucose Negative NEG mg/dL    Ketone TRACE (A) NEG mg/dL    Bilirubin Negative NEG      Blood Negative NEG      Urobilinogen 1.0 0.2 - 1.0 EU/dL    Nitrites Negative NEG      Leukocyte Esterase Negative NEG      WBC 0-4 0 - 4 /hpf    RBC 0-5 0 - 5 /hpf    Epithelial cells FEW FEW /lpf    Bacteria Negative NEG /hpf    Hyaline cast 2-5 0 - 5 /lpf   DRUG SCREEN, URINE   Result Value Ref Range    AMPHETAMINES Negative NEG      BARBITURATES Negative NEG      BENZODIAZEPINES Negative NEG      COCAINE Negative NEG      METHADONE Negative NEG      OPIATES Negative NEG      PCP(PHENCYCLIDINE) Negative NEG      THC (TH-CANNABINOL) Negative NEG      Drug screen comment (NOTE)    SALICYLATE   Result Value Ref Range    Salicylate level 1.7 (L) 2.8 - 20.0 MG/DL   ACETAMINOPHEN    Result Value Ref Range    Acetaminophen  level <2 (L) 10 - 30 ug/mL  Immunizations administered during this encounter:   There is no immunization history on file for this patient.    Screening for Metabolic Disorders for Patients on Antipsychotic Medications  (Data obtained from the EMR)    Estimated Body Mass Index  Estimated body mass index is 21.18 kg/m as calculated from the following:    Height as of this encounter: 5' 6 (1.676 m).    Weight as of this encounter: 59.5 kg (131 lb 3.2 oz).     Vital Signs/Blood Pressure  Visit Vitals  BP 109/68   Pulse 81   Temp 98.4 F (36.9 C)   Resp 16   Ht 5' 6 (1.676 m)   Wt 59.5 kg (131 lb 3.2 oz)   SpO2 96%   BMI 21.18 kg/m       Blood Glucose/Hemoglobin A1c  Lab Results   Component Value Date/Time    Glucose 97 07/02/2021 04:54 PM       No results found for: HBA1C, HBA1CEXT     Lipid Panel  No results found for: CHOL, CHOLX, CHLST, CHOLV, 884269, HDL, HDLP,  LDL, LDLC, DLDLP, TGLX, TRIGL, TRIGP, CHHD, CHHDX     Discharge Diagnosis: Mdd, recurrent, severe, w/o psychosis  Alcohol use d/o    Discharge Plan: He will be discharge to the sober living home     The patient Bradley Calhoun exhibits the ability to control behavior in a less restrictive environment.  Patient's level of functioning is improving.  No assaultive/destructive behavior has been observed for the past 24 hours.  No suicidal/homicidal threat or behavior has been observed for the past 24 hours.  There is no evidence of serious medication side effects.  Patient has not been in physical or protective restraints for at least the past 24 hours.    If weapons involved, how are they secured? No weapons involved     Is patient aware of and in agreement with discharge plan? He is aware of discharge and is in agreement     Arrangements for medication:  Prescriptions filled at [facility]     Copy of discharge instructions to provider?:  yes, fax to [facility]     Arrangements for transportation home:  lyft     Keep all follow up appointments as scheduled, continue to take prescribed medications per physician instructions.  Mental health crisis number:  911 or your local mental health crisis line number at [phone]             Discharge Medication List and Instructions:   Discharge Medication List as of 07/09/2021 12:09 PM        START taking these medications    Details   FLUoxetine (PROzac) 20 mg capsule Take 1 Capsule by mouth daily for 30 days. Indications: major depressive disorder, Normal, Disp-30 Capsule, R-0      traZODone  (DESYREL ) 150 mg tablet Take 1 Tablet by mouth nightly as needed for Sleep (For insomnia) for up to 30 days. Indications: insomnia associated with depression, Normal, Disp-30 Tablet, R-0      collagenase (SANTYL) 250 unit/gram ointment Apply 1 mg to affected area daily for 10 days. Indications: a skin ulcer, Normal, Disp-15 g, R-0             Unresulted Labs (24h ago, onward)      None           To obtain results of studies pending at discharge, please contact [phone]    Follow-up Information  Follow up With Specialties Details Why Contact Info    [facility]  Go on 07/09/2021 for intake/assessment to set up counseling and medication management. [address]  [place]  [address]  [phone]    Unknown, Provider, MD Allergy   Patient not available to ask              Advanced Directive:   Does the patient have an appointed surrogate decision maker? No  Does the patient have a Medical Advance Directive? No  Does the patient have a Psychiatric Advance Directive? No  If the patient does not have a surrogate or Medical Advance Directive AND Psychiatric Advance Directive, the patient was offered information on these advance directives Patient declined to complete    Patient Instructions: Please continue all medications until otherwise directed by physician.      Tobacco Cessation Discharge Plan:   Is the patient a smoker and needs referral for smoking cessation? Yes  Patient referred to the following for smoking cessation with an appointment? Refused     Patient was offered medication to assist with smoking cessation at discharge? Refused  Was education for smoking cessation added to the discharge instructions? Yes    Alcohol/Substance Abuse Discharge Plan:   Does the patient have a history of substance/alcohol abuse and requires a referral for treatment? Yes  Patient referred to the following for substance/alcohol abuse treatment with an appointment? Yes  Patient was offered medication to assist with alcohol cessation at discharge? No  Was education for substance/alcohol abuse added to discharge instructions? No    Patient discharged to Home; discussed with patient/caregiver and provided to the patient/caregiver either in hard copy or electronically.

## 2021-07-09 NOTE — Progress Notes (Signed)
Wound care note  Cleaned wound with sterile saline, applied Santyl, placed moistened gauze strip, covered with optifoam dressing. No exudate noted, slight sloughing at distal edge, cleaned.

## 2021-07-09 NOTE — Progress Notes (Signed)
 Problem: Anxiety  Goal: *Alleviation of anxiety  Outcome: Progressing Towards Goal   Reports relief from anxiety    Problem: Depressed Mood (Adult/Pediatric)  Goal: *STG: Participates in treatment plan  Outcome: Progressing Towards Goal  Attended and participated appropriately  Goal: *STG: Demonstrates reduction in symptoms and increase in insight into coping skills/future focused  Outcome: Progressing Towards Goal\     No signs and  symptoms of  detox

## 2021-07-13 ENCOUNTER — Inpatient Hospital Stay
Admit: 2021-07-13 | Discharge: 2021-07-14 | Disposition: A | Discharge status: Home / Self Care | Payer: Medicaid - Out of State | Attending: Emergency Medicine

## 2021-07-13 DIAGNOSIS — F1020 Alcohol dependence, uncomplicated: Secondary | ICD-10-CM

## 2021-07-13 NOTE — ED Notes (Signed)
Pt left the ER ambulatory while [facility] was attempting to give him resources.

## 2021-07-13 NOTE — ED Notes (Signed)
Pt arrives for alcohol intoxication last drink prior to arriving here. SI thoughts when not in the hospital, but states he won't act on them in the hospital. Denies HI. Pt was discharged from here last week, sent home with prozac and other meds and he has lost them. States he has been on a drinking bender since leaving, staying in random hotels, etc. Denies pain.

## 2021-07-13 NOTE — ED Provider Notes (Signed)
30yo M w/ hx chronic etoh abuse and bipolar p/w SI. Pt reports past few days of SI w/ plan to "jump in front of traffic." Also hearing voices telling him to kill himself. No HI. Daily drinker, last earlier today. Staying at sober living facility but still drinking, no drugs. Was admitted here for SI 1wk ago. States he lost his medications upon discharge. Has no physical complaints except for chronic abd pain w/ N/V/D (nonbloody) that is unchanged today.       Past Medical History:   Diagnosis Date    Bipolar 1 disorder ([place])     ETOH abuse        No past surgical history on file.      No family history on file.    Social History     Socioeconomic History    Marital status: SINGLE     Spouse name: Not on file    Number of children: Not on file    Years of education: Not on file    Highest education level: Not on file   Occupational History    Not on file   Tobacco Use    Smoking status: Every Day     Packs/day: 1.00     Types: Cigarettes    Smokeless tobacco: Not on file   Substance and Sexual Activity    Alcohol use: Not on file    Drug use: Not on file    Sexual activity: Not on file   Other Topics Concern    Not on file   Social History Narrative    Not on file     Social Determinants of Health     Financial Resource Strain: Not on file   Food Insecurity: Not on file   Transportation Needs: Not on file   Physical Activity: Not on file   Stress: Not on file   Social Connections: Not on file   Intimate Partner Violence: Not on file   Housing Stability: Not on file         ALLERGIES: Patient has no known allergies.    Review of Systems   Constitutional:  Negative for chills, diaphoresis and fever.   HENT:  Negative for facial swelling, mouth sores, nosebleeds, trouble swallowing and voice change.    Eyes:  Negative for pain and visual disturbance.   Respiratory:  Negative for apnea, cough, shortness of breath, wheezing and stridor.    Cardiovascular:  Negative for chest pain, palpitations and leg swelling.    Gastrointestinal:  Positive for abdominal pain, diarrhea, nausea and vomiting. Negative for abdominal distention and blood in stool.   Genitourinary:  Negative for difficulty urinating, dysuria, flank pain, hematuria, scrotal swelling and testicular pain.   Musculoskeletal:  Negative for joint swelling.   Skin:  Negative for color change and rash.   Allergic/Immunologic: Negative for immunocompromised state.   Neurological:  Negative for dizziness, syncope and light-headedness.   Hematological:  Does not bruise/bleed easily.   Psychiatric/Behavioral:  Positive for suicidal ideas. Negative for agitation and behavioral problems.      Vitals:    07/13/21 1632   BP: (!) 149/99   Pulse: (!) 113   Resp: 18   Temp: 97.3 ??F (36.3 ??C)   SpO2: 97%            Physical Exam  Vitals and nursing note reviewed.   Constitutional:       General: He is not in acute distress.     Appearance: Normal appearance.  He is not ill-appearing or toxic-appearing.   HENT:      Head: Normocephalic and atraumatic.      Right Ear: External ear normal.      Left Ear: External ear normal.      Nose: Nose normal.      Mouth/Throat:      Mouth: Mucous membranes are moist.      Pharynx: Oropharynx is clear. No oropharyngeal exudate or posterior oropharyngeal erythema.   Eyes:      General: No scleral icterus.     Extraocular Movements: Extraocular movements intact.      Conjunctiva/sclera: Conjunctivae normal.      Pupils: Pupils are equal, round, and reactive to light.   Cardiovascular:      Rate and Rhythm: Regular rhythm. Tachycardia present.      Pulses: Normal pulses.      Heart sounds: No murmur heard.    No friction rub. No gallop.   Pulmonary:      Effort: Pulmonary effort is normal. No respiratory distress.      Breath sounds: Normal breath sounds. No stridor. No wheezing, rhonchi or rales.   Abdominal:      General: Bowel sounds are normal. There is no distension.      Palpations: Abdomen is soft.      Tenderness: There is no abdominal  tenderness. There is no guarding or rebound.   Musculoskeletal:         General: No deformity. Normal range of motion.      Cervical back: Normal range of motion and neck supple. No rigidity.      Right lower leg: No edema.      Left lower leg: No edema.   Skin:     General: Skin is warm.      Capillary Refill: Capillary refill takes less than 2 seconds.      Coloration: Skin is not jaundiced.   Neurological:      General: No focal deficit present.      Mental Status: He is alert.      Cranial Nerves: No cranial nerve deficit.      Sensory: No sensory deficit.      Motor: No weakness.      Coordination: Coordination normal.   Psychiatric:         Mood and Affect: Mood normal.         Behavior: Behavior normal.         Thought Content: Thought content normal.         Judgment: Judgment normal.      Comments: Calm and cooperative      LABORATORY TESTS:  No visits with results within 1 Day(s) from this visit.   Latest known visit with results is:   Admission on 07/02/2021, Discharged on 07/09/2021   Component Date Value Ref Range Status    WBC 07/02/2021 12.6 (A)  4.1 - 11.1 K/uL Final    RBC 07/02/2021 4.44  4.10 - 5.70 M/uL Final    HGB 07/02/2021 15.1  12.1 - 17.0 g/dL Final    HCT 07/02/2021 43.9  36.6 - 50.3 % Final    MCV 07/02/2021 98.9  80.0 - 99.0 FL Final    MCH 07/02/2021 34.0  26.0 - 34.0 PG Final    MCHC 07/02/2021 34.4  30.0 - 36.5 g/dL Final    RDW 07/02/2021 12.3  11.5 - 14.5 % Final    PLATELET 07/02/2021 249  150 - 400 K/uL Final  MPV 07/02/2021 9.6  8.9 - 12.9 FL Final    NRBC 07/02/2021 0.0  0 PER 100 WBC Final    ABSOLUTE NRBC 07/02/2021 0.00  0.00 - 0.01 K/uL Final    NEUTROPHILS 07/02/2021 80 (A)  32 - 75 % Final    LYMPHOCYTES 07/02/2021 12  12 - 49 % Final    MONOCYTES 07/02/2021 5  5 - 13 % Final    EOSINOPHILS 07/02/2021 2  0 - 7 % Final    BASOPHILS 07/02/2021 1  0 - 1 % Final    IMMATURE GRANULOCYTES 07/02/2021 0  0.0 - 0.5 % Final    ABS. NEUTROPHILS 07/02/2021 10.1 (A)  1.8 - 8.0 K/UL  Final    ABS. LYMPHOCYTES 07/02/2021 1.6  0.8 - 3.5 K/UL Final    ABS. MONOCYTES 07/02/2021 0.6  0.0 - 1.0 K/UL Final    ABS. EOSINOPHILS 07/02/2021 0.3  0.0 - 0.4 K/UL Final    ABS. BASOPHILS 07/02/2021 0.1  0.0 - 0.1 K/UL Final    ABS. IMM. GRANS. 07/02/2021 0.0  0.00 - 0.04 K/UL Final    DF 07/02/2021 AUTOMATED    Final    Sodium 07/02/2021 142  136 - 145 mmol/L Final    Potassium 07/02/2021 3.9  3.5 - 5.1 mmol/L Final    Chloride 07/02/2021 107  97 - 108 mmol/L Final    CO2 07/02/2021 32  21 - 32 mmol/L Final    Anion gap 07/02/2021 3 (A)  5 - 15 mmol/L Final    Glucose 07/02/2021 97  65 - 100 mg/dL Final    BUN 07/02/2021 10  6 - 20 MG/DL Final    Creatinine 07/02/2021 0.89  0.70 - 1.30 MG/DL Final    BUN/Creatinine ratio 07/02/2021 11 (A)  12 - 20   Final    eGFR 07/02/2021 >60  >60 ml/min/1.20m Final    Comment:      Pediatric calculator link: https://www.kidney.org/professionals/kdoqi/gfr_calculatorped       Effective May 28, 2021       These results are not intended for use in patients <155years of age.       eGFR results are calculated without a race factor using  the 2021 CKD-EPI equation. Careful clinical correlation is recommended, particularly when comparing to results calculated using previous equations.  The CKD-EPI equation is less accurate in patients with extremes of muscle mass, extra-renal metabolism of creatinine, excessive creatine ingestion, or following therapy that affects renal tubular secretion.      Calcium 07/02/2021 8.8  8.5 - 10.1 MG/DL Final    Bilirubin, total 07/02/2021 0.4  0.2 - 1.0 MG/DL Final    ALT (SGPT) 07/02/2021 34  12 - 78 U/L Final    AST (SGOT) 07/02/2021 34  15 - 37 U/L Final    Alk. phosphatase 07/02/2021 61  45 - 117 U/L Final    Protein, total 07/02/2021 7.6  6.4 - 8.2 g/dL Final    Albumin 07/02/2021 3.7  3.5 - 5.0 g/dL Final    Globulin 07/02/2021 3.9  2.0 - 4.0 g/dL Final    A-G Ratio 07/02/2021 0.9 (A)  1.1 - 2.2   Final    ALCOHOL(ETHYL),SERUM 07/02/2021 199  (A)  <10 MG/DL Final    Color 07/02/2021 YELLOW/STRAW    Final    Color Reference Range: Straw, Yellow or Dark Yellow    Appearance 07/02/2021 CLEAR  CLEAR   Final    Specific gravity 07/02/2021 1.017  1.003 - 1.030  Final    pH (UA) 07/02/2021 6.0  5.0 - 8.0   Final    Protein 07/02/2021 TRACE (A)  NEG mg/dL Final    Glucose 07/02/2021 Negative  NEG mg/dL Final    Ketone 07/02/2021 TRACE (A)  NEG mg/dL Final    Bilirubin 07/02/2021 Negative  NEG   Final    Blood 07/02/2021 Negative  NEG   Final    Urobilinogen 07/02/2021 1.0  0.2 - 1.0 EU/dL Final    Nitrites 07/02/2021 Negative  NEG   Final    Leukocyte Esterase 07/02/2021 Negative  NEG   Final    WBC 07/02/2021 0-4  0 - 4 /hpf Final    RBC 07/02/2021 0-5  0 - 5 /hpf Final    Epithelial cells 07/02/2021 FEW  FEW /lpf Final    Epithelial cell category consists of squamous cells and /or transitional urothelial cells. Renal tubular cells, if present, are separately identified as such.    Bacteria 07/02/2021 Negative  NEG /hpf Final    Hyaline cast 07/02/2021 2-5  0 - 5 /lpf Final    Urine culture hold 07/02/2021 Urine on hold in Microbiology dept for 2 days.  If unpreserved urine is submitted, it cannot be used for addtional testing after 24 hours, recollection will be required.    Final    AMPHETAMINES 07/02/2021 Negative  NEG   Final    BARBITURATES 07/02/2021 Negative  NEG   Final    BENZODIAZEPINES 07/02/2021 Negative  NEG   Final    COCAINE 07/02/2021 Negative  NEG   Final    METHADONE 07/02/2021 Negative  NEG   Final    OPIATES 07/02/2021 Negative  NEG   Final    PCP(PHENCYCLIDINE) 07/02/2021 Negative  NEG   Final    THC (TH-CANNABINOL) 07/02/2021 Negative  NEG   Final    Drug screen comment 07/02/2021 (NOTE)   Final    Comment: This test is a screen for drugs of abuse in a medical setting only   (i.e., they are unconfirmed results and as such must not be used for   non-medical purposes e.g., employment testing, legal testing). Due to   its inherent nature,  false positive (FP) and false negative (FN)   results may be obtained. Therefore, if necessary for medical care,   recommend confirmation of positive findings by GC/MS. The cutoff   values (i.e., the level at which this screening test becomes positive   for a given drug group) are:    Amphetamine/Methamphetamine: 300 ng/mL  Barbiturates:                200 ng/mL  Benzodiazepines:             200 ng/mL  Cocaine:                     150 ng/mL  Methadone:                   300 ng/mL  Opiates:                     300 ng/mL   Phencyclidine, PCP:           25 ng/mL  Marijuana, THC:               50 ng/mL    This screening test can identify the presence of the following drugs   when above the cutoff value; see list posted on the  intranet. It can   be viewed by sel                           ecting in sequence the following from the [place] home   page: [place]; [place], Resources; [facility], Physician Resources Q to Z; "UDS (Urine Drug Screen   Automated) List of Detectable Drugs."     Or use web address:   [email or web address]/[email or web address]/  UDS%20List%20of%20Detectable%20Drugs.pdf      Salicylate level 14/78/2956 1.7 (A)  2.8 - 20.0 MG/DL Final    Comment: Analgesic: 2-10 mg/dL  Anti-inflammatory: 10-30 mg/dl  Toxic: >30 mg/dl      Acetaminophen level 07/02/2021 <2 (A)  10 - 30 ug/mL Final    Acetaminophen greater than 150 ??g/mL at 4 hours after ingestion and 50 ??g/mL at 12 hours after ingestion is often associated with toxic reactions.    SARS-CoV-2 by PCR 07/02/2021 Not detected  NOTD   Final    Not Detected results do not preclude SARS-CoV-2 infection and should not be used as the sole basis for patient management decisions.Results must be combined with clinical observations, patient history, and epidemiological information.    Influenza A by PCR 07/02/2021 Not detected  NOTD   Final    Influenza B by PCR 07/02/2021 Not detected  NOTD   Final     Comment: Testing was performed using [employer] SARS-CoV-2 and Influenza A/B nucleic acid assay.  This test is a multiplex Real-Time Reverse Transcriptase Polymerase Chain Reaction (RT-PCR) based in vitro diagnostic test intended for the qualitative detection of nucleic acids from SARS-CoV-2, Influenza A, and Influenza B in nasopharyngeal and nasal swab specimens for use under the FDA's Emergency Use Authorization (EUA) only.       Fact sheet for Patients: CartCleaning.hu  Fact sheet for Healthcare Providers: https://www.fda.fov/media/142191/download         IMAGING RESULTS:  No orders to display       MEDICATIONS GIVEN:    IMPRESSION:  1. Alcoholism ([place])    2. Suicidal thoughts        PLAN:  - Discharge    Bevelyn Ngo, MD      MDM  Number of Diagnoses or Management Options  Diagnosis management comments: 30yo M w/ hx chronic etoh abuse and bipolar p/w SI and AH. Pt calm, cooperative, not intoxicated appearing. Afebrile, hemodynamically stable w/o resp distress or hypoxia. No evidence of trauma. Ordered crisis labs, sitter, psych to eval.    1800 Pt is medically stable w/o any signs/symptoms of life threatening emergency medical conditions, no further emergency medical work-up is indicated at this time and pt is thus appropriate for psych evaluation.    1900 Pt seen by [facility]. Pt was very recently admitted to inpatient psych for similar and has difficult social situation c/b substance abuse. [facility] thinks pt is low risk and safe/stable for discharge. Just after [facility] eval, pt became very frustrated, stood up and walked out.       Amount and/or Complexity of Data Reviewed  Clinical lab tests: ordered and reviewed  Tests in the medicine section of CPT??: reviewed and ordered    Risk of Complications, Morbidity, and/or Mortality  Presenting problems: moderate  Diagnostic procedures: moderate  Management options: moderate           Procedures

## 2021-10-30 ENCOUNTER — Inpatient Hospital Stay
Admit: 2021-10-30 | Discharge: 2021-10-30 | Disposition: A | Discharge status: Home / Self Care | Payer: PRIVATE HEALTH INSURANCE | Attending: Emergency Medicine

## 2021-10-30 DIAGNOSIS — K2920 Alcoholic gastritis without bleeding: Secondary | ICD-10-CM

## 2021-10-30 LAB — CBC WITH AUTO DIFFERENTIAL
Absolute Immature Granulocyte: 0 10*3/uL (ref 0.00–0.04)
Basophils %: 1 % (ref 0–2)
Basophils Absolute: 0.1 10*3/uL (ref 0.0–0.1)
Eosinophils %: 0 % (ref 0–5)
Eosinophils Absolute: 0 10*3/uL (ref 0.0–0.4)
Hematocrit: 37.5 % (ref 36.0–48.0)
Hemoglobin: 13.6 g/dL (ref 13.0–16.0)
Immature Granulocytes: 0 % (ref 0.0–0.5)
Lymphocytes %: 9 % — ABNORMAL LOW (ref 21–52)
Lymphocytes Absolute: 1.1 10*3/uL (ref 0.9–3.6)
MCH: 32.2 PG (ref 24.0–34.0)
MCHC: 36.3 g/dL (ref 31.0–37.0)
MCV: 88.7 FL (ref 78.0–100.0)
MPV: 9.8 FL (ref 9.2–11.8)
Monocytes %: 8 % (ref 3–10)
Monocytes Absolute: 1 10*3/uL (ref 0.05–1.2)
Neutrophils %: 82 % — ABNORMAL HIGH (ref 40–73)
Neutrophils Absolute: 10.1 10*3/uL — ABNORMAL HIGH (ref 1.8–8.0)
Nucleated RBCs: 0 PER 100 WBC
Platelets: 250 10*3/uL (ref 135–420)
RBC: 4.23 M/uL — ABNORMAL LOW (ref 4.35–5.65)
RDW: 12.9 % (ref 11.6–14.5)
WBC: 12.3 10*3/uL (ref 4.6–13.2)
nRBC: 0 10*3/uL (ref 0.00–0.01)

## 2021-10-30 LAB — COMPREHENSIVE METABOLIC PANEL
ALT: 57 U/L (ref 16–61)
AST: 69 U/L — ABNORMAL HIGH (ref 10–38)
Albumin/Globulin Ratio: 1.3 (ref 0.8–1.7)
Albumin: 4.3 g/dL (ref 3.4–5.0)
Alk Phosphatase: 58 U/L (ref 45–117)
Anion Gap: 14 mmol/L (ref 3.0–18)
BUN: 11 MG/DL (ref 7.0–18)
Bun/Cre Ratio: 14 (ref 12–20)
CO2: 22 mmol/L (ref 21–32)
Calcium: 9.6 MG/DL (ref 8.5–10.1)
Chloride: 102 mmol/L (ref 100–111)
Creatinine: 0.79 MG/DL (ref 0.6–1.3)
Est, Glom Filt Rate: >60 mL/min/{1.73_m2} (ref >60)
Globulin: 3.2 g/dL (ref 2.0–4.0)
Glucose: 86 mg/dL (ref 74–99)
Potassium: 3.1 mmol/L — ABNORMAL LOW (ref 3.5–5.5)
Sodium: 138 mmol/L (ref 136–145)
Total Bilirubin: 1.5 MG/DL — ABNORMAL HIGH (ref 0.2–1.0)
Total Protein: 7.5 g/dL (ref 6.4–8.2)

## 2021-10-30 LAB — URINALYSIS
Bilirubin Urine: NEGATIVE
Blood, Urine: NEGATIVE
Glucose, UA: NEGATIVE mg/dL
Ketones, Urine: 80 mg/dL — AB
Nitrite, Urine: NEGATIVE
Protein, UA: 30 mg/dL — AB
Specific Gravity, UA: 1.023 (ref 1.005–1.030)
Urobilinogen, Urine: 1 EU/dL (ref 0.2–1.0)
pH, Urine: 7.5 (ref 5.0–8.0)

## 2021-10-30 LAB — URINE DRUG SCREEN
Amphetamine, Urine: POSITIVE — AB
Barbiturates, Urine: NEGATIVE
Benzodiazepines, Urine: NEGATIVE
Cocaine, Urine: POSITIVE — AB
Methadone, Urine: NEGATIVE
Opiates, Urine: NEGATIVE
PCP, Urine: NEGATIVE
THC, TH-Cannabinol, Urine: NEGATIVE

## 2021-10-30 LAB — ETHANOL: Ethanol Lvl: 91 MG/DL — ABNORMAL HIGH (ref 0–3)

## 2021-10-30 LAB — URINALYSIS, MICRO
Epithelial Cells UA: NEGATIVE /lpf (ref 0–5)
WBC, UA: 0 /hpf (ref 0–5)

## 2021-10-30 LAB — MAGNESIUM: Magnesium: 1.5 mg/dL — ABNORMAL LOW (ref 1.6–2.6)

## 2021-10-30 LAB — TROPONIN: Troponin, High Sensitivity: 12 ng/L (ref 0–78)

## 2021-10-30 LAB — LIPASE: Lipase: 60 U/L — ABNORMAL LOW (ref 73–393)

## 2021-10-30 MED ORDER — ONDANSETRON HCL 4 MG PO TABS
4 MG | ORAL_TABLET | Freq: Three times a day (TID) | ORAL | 0 refills | Status: AC | PRN
Start: 2021-10-30 — End: ?

## 2021-10-30 MED ORDER — SODIUM CHLORIDE (PF) 0.9 % IJ SOLN
0.9 % | INTRAMUSCULAR | Status: AC
Start: 2021-10-30 — End: 2021-10-30
  Administered 2021-10-30: 08:00:00 20 mg via INTRAVENOUS

## 2021-10-30 MED ORDER — MAGNESIUM SULFATE 2000 MG/50 ML IVPB PREMIX
2 GM/50ML | Freq: Once | INTRAVENOUS | Status: AC
Start: 2021-10-30 — End: 2021-10-30
  Administered 2021-10-30: 09:00:00 2000 mg via INTRAVENOUS

## 2021-10-30 MED ORDER — MORPHINE SULFATE (PF) 2 MG/ML IV SOLN
2 MG/ML | INTRAVENOUS | Status: DC
Start: 2021-10-30 — End: 2021-10-30

## 2021-10-30 MED ORDER — ONDANSETRON HCL 4 MG/2ML IJ SOLN
42 MG/2ML | INTRAMUSCULAR | Status: AC
Start: 2021-10-30 — End: 2021-10-30
  Administered 2021-10-30: 08:00:00 4 mg via INTRAVENOUS

## 2021-10-30 MED ORDER — LORAZEPAM 2 MG/ML IJ SOLN
2 MG/ML | Freq: Once | INTRAMUSCULAR | Status: AC
Start: 2021-10-30 — End: 2021-10-30
  Administered 2021-10-30: 08:00:00 1 mg via INTRAVENOUS

## 2021-10-30 MED ORDER — HALOPERIDOL LACTATE 5 MG/ML IJ SOLN
5 MG/ML | Freq: Once | INTRAMUSCULAR | Status: AC
Start: 2021-10-30 — End: 2021-10-30
  Administered 2021-10-30: 09:00:00 2 mg via INTRAVENOUS

## 2021-10-30 MED ORDER — FAMOTIDINE 20 MG PO TABS
20 MG | ORAL_TABLET | Freq: Two times a day (BID) | ORAL | 3 refills | Status: AC
Start: 2021-10-30 — End: ?

## 2021-10-30 MED ORDER — INFUVITE ADULT IV INJ
100 MG/ML | Freq: Once | INTRAVENOUS | Status: DC
Start: 2021-10-30 — End: 2021-10-30

## 2021-10-30 MED FILL — LORAZEPAM 2 MG/ML IJ SOLN: 2 MG/ML | INTRAMUSCULAR | Qty: 1

## 2021-10-30 MED FILL — MORPHINE SULFATE 2 MG/ML IJ SOLN: 2 MG/ML | INTRAMUSCULAR | Qty: 1

## 2021-10-30 MED FILL — MAGNESIUM SULFATE 2 GM/50ML IV SOLN: 2 GM/50ML | INTRAVENOUS | Qty: 50

## 2021-10-30 MED FILL — FOLIC ACID 5 MG/ML IJ SOLN: 5 MG/ML | INTRAMUSCULAR | Qty: 0.2

## 2021-10-30 MED FILL — HALOPERIDOL LACTATE 5 MG/ML IJ SOLN: 5 MG/ML | INTRAMUSCULAR | Qty: 1

## 2021-10-30 MED FILL — FAMOTIDINE (PF) 20 MG/2ML IV SOLN: 20 MG/2ML | INTRAVENOUS | Qty: 2

## 2021-10-30 MED FILL — ONDANSETRON HCL 4 MG/2ML IJ SOLN: 4 MG/2ML | INTRAMUSCULAR | Qty: 2

## 2021-10-30 NOTE — ED Notes (Signed)
Belongings given back to patient upon discharge. Medications obtained from pharmacy & given back to patient. IV removed, discharge instructions reviewed. No further questions. Patient ambulatory to ED lobby.      Janece Canterbury, RN  10/30/21 [phone]

## 2021-10-30 NOTE — ED Provider Notes (Signed)
7:21 AM EST    Patient care will be transferred to Dr. Salem Caster from Dr. Edilia Bo.  Discussed available diagnostic results and care plan at length.      8:21 AM EST    Patient is ambulating with a steady gait and clear mentation.  Tolerating p.o. without difficulty.  Provided with referrals for appropriate follow-up and return precautions.     Rosanne Ashing, MD  10/30/21 [phone]

## 2021-10-30 NOTE — ED Triage Notes (Signed)
Patient to ED via EMS for reports of drug and ETOH relapse. Patient reports that he has been clean for the past 55 days, however today he admits that he has used meth, cocaine and has been drinking multiple types of ETOH to include vodka and tequila. Patient reports chest pain and hallucinations at this time

## 2021-10-30 NOTE — ED Notes (Signed)
Received patient from shift change. Patient resting in bed quietly. No needs. Per provider, awaiting banana bag then likely discharge     Bradley Canterbury, RN  10/30/21 [phone]

## 2021-10-30 NOTE — ED Notes (Signed)
Magnesium stopped related to adverse affect pt c/o feeling like he was on fire even on the lowest infusion rate.      Vonzella Nipple, RN  10/30/21 [phone]

## 2021-10-30 NOTE — ED Notes (Signed)
Patient's home medications taken to in patient pharmacy for appropriate management     JOHNNELL LIOU, RN  10/30/21 [phone]

## 2021-10-30 NOTE — ED Notes (Signed)
Patient ambulated with no difficulties. Provider made aware.      Janece Canterbury, RN  10/30/21 [phone]

## 2021-10-30 NOTE — ED Provider Notes (Signed)
EMERGENCY DEPARTMENT HISTORY AND PHYSICAL EXAM    Date: 10/30/2021  Patient Name: Bradley Calhoun    History of Presenting Illness     Chief Complaint   Patient presents with    Drug / Alcohol Assessment    Emesis         History Provided By: Patient, EMS, and outside patient notes    Additional History (Context):   3:46 AM EST  Bradley Calhoun is a 31 y.o. male with PMHX of schizoaffective disorder, polysubstance abuse including alcohol cocaine methamphetamines distant history of IV drug use, who presents to the emergency department C/O nausea vomiting alcohol and drug problems.  Patient has longstanding history of polysubstance abuse including alcohol, drinking whiskey as well as methamphetamine IV drug use cocaine.  He has been in rehab several times in the [place] area.  He also just completed a month at the [facility] alcohol and rehab [facility] in [facility]  He was just released and said he has been on 5 days worth of alcohol and substance abuse drinking.  Paramedics picked him up vomiting in a parking lot of [place].  He is tremulous and agitated patient states he does not have any take any medications however in his position we found sheets of pill bottles and sheets tablets of trazodone, etodolac, quetiapine, propranolol, melatonin, escitalopram, Ferro-Sequels.  Also on his position he had a bottle of Petrone tequila as well as vodka and another and of identifiable bottle of liquor      Social History  He acknowledges smoking cigarettes sometimes marijuana.  Most frequently he drinks alcohol is following cocaine methamphetamine.    Family History  Denies any family history.    PCP: Pcp No    Current Facility-Administered Medications   Medication Dose Route Frequency Provider Last Rate Last Admin    sodium chloride 0.9 % 6,387 mL with folic acid 1 mg, adult multi-vitamin with vitamin k 10 mL, thiamine 100 mg   IntraVENous Once Luvenia Redden, MD         Current Outpatient Medications    Medication Sig Dispense Refill    traZODone (DESYREL) 100 MG tablet Take 100 mg by mouth nightly as needed      etodolac (LODINE) 400 MG tablet Take 400 mg by mouth 2 times daily      ondansetron (ZOFRAN) 4 MG tablet Take 1 tablet by mouth 3 times daily as needed for Nausea or Vomiting 15 tablet 0    famotidine (PEPCID) 20 MG tablet Take 1 tablet by mouth 2 times daily 60 tablet 3    lisinopril (PRINIVIL;ZESTRIL) 10 MG tablet Take 10 mg by mouth daily         Past History     Past Medical History:  Past Medical History:   Diagnosis Date    Hypertension     Psychiatric problem        Past Surgical History:  History reviewed. No pertinent surgical history.    Family History:  History reviewed. No pertinent family history.    Social History:  Social History     Tobacco Use    Smoking status: Every Day     Packs/day: 1.00     Years: 17.00     Pack years: 17.00     Types: Cigarettes    Smokeless tobacco: Never   Vaping Use    Vaping Use: Never used   Substance Use Topics    Alcohol use: Yes     Comment: 10-15  24oz beers per day    Drug use: Yes     Types: Marijuana Sherrie Mustache)     Comment: subutex       Allergies:  No Known Allergies      Review of Systems   Review of Systems  Patient complains of tremulousness and agitation and did nausea vomiting.    Physical Exam     Vitals:    10/30/21 0249 10/30/21 0441 10/30/21 0515 10/30/21 0715   BP: (!) 151/99 (!) 146/99 (!) 133/90 (!) 144/98   Pulse: (!) 102  80 86   Resp:  '18 18 18   ' Temp: 98.2 ??F (36.8 ??C)  98.2 ??F (36.8 ??C)    TempSrc: Oral  Oral    SpO2:  100% 99% 98%   Weight: 132 lb (59.9 kg)      Height: '5\' 8"'  (1.727 m)        Physical Exam  Vitals and nursing note reviewed.   Constitutional:       Appearance: Normal appearance.   HENT:      Head: Normocephalic and atraumatic.      Ears:      Comments: No blood or fluid from ears or nose     Nose:      Comments: No blood or fluid from ears or nose     Mouth/Throat:      Mouth: Mucous membranes are moist.   Eyes:       Extraocular Movements: Extraocular movements intact.   Cardiovascular:      Rate and Rhythm: Tachycardia present.      Heart sounds: Normal heart sounds, S1 normal and S2 normal.   Pulmonary:      Effort: Pulmonary effort is normal.   Abdominal:      Palpations: Abdomen is soft.      Tenderness: There is no abdominal tenderness.      Comments: Diffusely tender but mostly tender with guarding into the epigastric region.   Musculoskeletal:         General: No deformity.      Cervical back: No rigidity.   Skin:     General: Skin is warm and dry.      Capillary Refill: Capillary refill takes less than 2 seconds.   Neurological:      General: No focal deficit present.      Mental Status: He is oriented to person, place, and time.      GCS: GCS eye subscore is 4. GCS verbal subscore is 5. GCS motor subscore is 6.      Cranial Nerves: Cranial nerves 2-12 are intact.      Sensory: Sensation is intact.      Motor: Tremor present. No weakness or seizure activity.   Psychiatric:         Attention and Perception: Attention normal.         Mood and Affect: Mood normal.         Speech: Speech normal.     Diagnostic Study Results     Labs -  Recent Results (from the past 24 hour(s))   CMP    Collection Time: 10/30/21  3:00 AM   Result Value Ref Range    Sodium 138 136 - 145 mmol/L    Potassium 3.1 (L) 3.5 - 5.5 mmol/L    Chloride 102 100 - 111 mmol/L    CO2 22 21 - 32 mmol/L    Anion Gap 14 3.0 - 18 mmol/L    Glucose  86 74 - 99 mg/dL    BUN 11 7.0 - 18 MG/DL    Creatinine 0.79 0.6 - 1.3 MG/DL    Bun/Cre Ratio 14 12 - 20      Est, Glom Filt Rate >60 >60 ml/min/1.52m    Calcium 9.6 8.5 - 10.1 MG/DL    Total Bilirubin 1.5 (H) 0.2 - 1.0 MG/DL    ALT 57 16 - 61 U/L    AST 69 (H) 10 - 38 U/L    Alk Phosphatase 58 45 - 117 U/L    Total Protein 7.5 6.4 - 8.2 g/dL    Albumin 4.3 3.4 - 5.0 g/dL    Globulin 3.2 2.0 - 4.0 g/dL    Albumin/Globulin Ratio 1.3 0.8 - 1.7     CBC with Auto Differential    Collection Time: 10/30/21  3:00 AM    Result Value Ref Range    WBC 12.3 4.6 - 13.2 K/uL    RBC 4.23 (L) 4.35 - 5.65 M/uL    Hemoglobin 13.6 13.0 - 16.0 g/dL    Hematocrit 37.5 36.0 - 48.0 %    MCV 88.7 78.0 - 100.0 FL    MCH 32.2 24.0 - 34.0 PG    MCHC 36.3 31.0 - 37.0 g/dL    RDW 12.9 11.6 - 14.5 %    Platelets 250 135 - 420 K/uL    MPV 9.8 9.2 - 11.8 FL    Nucleated RBCs 0.0 0 PER 100 WBC    nRBC 0.00 0.00 - 0.01 K/uL    Seg Neutrophils 82 (H) 40 - 73 %    Lymphocytes 9 (L) 21 - 52 %    Monocytes 8 3 - 10 %    Eosinophils % 0 0 - 5 %    Basophils 1 0 - 2 %    Immature Granulocytes 0 0.0 - 0.5 %    Segs Absolute 10.1 (H) 1.8 - 8.0 K/UL    Absolute Lymph # 1.1 0.9 - 3.6 K/UL    Absolute Mono # 1.0 0.05 - 1.2 K/UL    Absolute Eos # 0.0 0.0 - 0.4 K/UL    Basophils Absolute 0.1 0.0 - 0.1 K/UL    Absolute Immature Granulocyte 0.0 0.00 - 0.04 K/UL    Differential Type AUTOMATED     ETOH    Collection Time: 10/30/21  3:00 AM   Result Value Ref Range    Ethanol Lvl 91 (H) 0 - 3 MG/DL   Magnesium    Collection Time: 10/30/21  3:00 AM   Result Value Ref Range    Magnesium 1.5 (L) 1.6 - 2.6 mg/dL   Lipase    Collection Time: 10/30/21  3:00 AM   Result Value Ref Range    Lipase 60 (L) 73 - 393 U/L   Troponin    Collection Time: 10/30/21  3:00 AM   Result Value Ref Range    Troponin, High Sensitivity 12 0 - 78 ng/L   EKG 12 Lead    Collection Time: 10/30/21  3:16 AM   Result Value Ref Range    Ventricular Rate 86 BPM    Atrial Rate 86 BPM    P-R Interval 122 ms    QRS Duration 84 ms    Q-T Interval 400 ms    QTc Calculation (Bazett) 478 ms    P Axis 64 degrees    R Axis 67 degrees    T Axis 53 degrees    Diagnosis  Sinus rhythm with premature supraventricular complexes        Radiologic Studies -   No orders to display     '@CT48' @  '@CXR48' @    Medications given in the ED-  Medications   sodium chloride 0.9 % 6,283 mL with folic acid 1 mg, adult multi-vitamin with vitamin k 10 mL, thiamine 100 mg (has no administration in time range)   LORazepam (ATIVAN)  injection 1 mg (1 mg IntraVENous Given 10/30/21 0319)   ondansetron (ZOFRAN) injection 4 mg (4 mg IntraVENous Given 10/30/21 0320)   famotidine (PEPCID) 20 mg in sodium chloride (PF) 0.9 % 10 mL injection (20 mg IntraVENous Given 10/30/21 0321)   magnesium sulfate 2000 mg in 50 mL IVPB premix (0 mg IntraVENous Stopped 10/30/21 0438)   haloperidol lactate (HALDOL) injection 2 mg (2 mg IntraVENous Given 10/30/21 0425)         Medical Decision Making   I am the first provider for this patient.    I reviewed the vital signs, available nursing notes, past medical history, past surgical history, family history and social history.    Vital Signs-Reviewed the patient's vital signs.    Pulse Oximetry Analysis -100% on room air    Cardiac Monitor:  Rate: 106 bpm  Rhythm: Borderline tachycardia    EKG interpretation: (Preliminary)  3:16 AM  Sinus rhythm, rate 86, occasional PACs, normal PR interval 122, normal QRS 84, normal ST segments with QTc of 478.  EKG read by Marene Lenz, MD      Records Reviewed: NURSING NOTES AND PREVIOUS MEDICAL RECORDS    Provider Notes (Medical Decision Making):   Patient arrives borderline tachycardic with some hypertension.  Exam also demonstrates some tremors.  Primary concern for him would be abdominal pain with possible tears represent irruption of his cath even mesenteric rent.  Pancreatitis gastritis are most likely as diagnoses.  Unlikely this cardiac event.  We will giving him medications he also was given some magnesium IV.  Patient became markedly agitated with this began itching and crawling on the skin.    Procedures:  Procedures    ED Course:   3:46 AM EST: Initial assessment performed. The patients presenting problems have been discussed, and they are in agreement with the care plan formulated and outlined with them.  I have encouraged them to ask questions as they arise throughout their visit.    7:32 AM  Change of attending care notation  Patient will be signed out to Dr. Heywood Iles,  daytime attending physician.  After completion of bed and bag patient should be appropriate for discharge.    Diagnosis and Disposition       DISCHARGE NOTE:  Patient is going to finish receiving banana bag salts and electrolytes.  Also waiting for him to return to more lucid and sober.  We will arrange for medications for outpatient management.  Should he decompensate start having withdrawal symptoms including tachycardia hypertension or delusions which she does not have at this time would consider admission for hospital for withdrawal.  Otherwise patient should be appropriate for outpatient management.      Jaizon TDVVO'H  results have been reviewed with him.  He has been counseled regarding his diagnosis, treatment, and plan.  He verbally conveys understanding and agreement of the signs, symptoms, diagnosis, treatment and prognosis and additionally agrees to follow up as discussed.  He also agrees with the care-plan and conveys that all of his questions have been answered.  I have also  provided discharge instructions for him that include: educational information regarding their diagnosis and treatment, and list of reasons why they would want to return to the ED prior to their follow-up appointment, should his condition change. He has been provided with education for proper emergency department utilization.     CLINICAL IMPRESSION:    1. Acute alcoholic gastritis without hemorrhage    2. Nausea and vomiting, unspecified vomiting type    3. Polysubstance abuse ([place])        PLAN:  1. D/C Home  2.      Medication List        START taking these medications      famotidine 20 MG tablet  Commonly known as: Pepcid  Take 1 tablet by mouth 2 times daily     ondansetron 4 MG tablet  Commonly known as: ZOFRAN  Take 1 tablet by mouth 3 times daily as needed for Nausea or Vomiting            ASK your doctor about these medications      etodolac 400 MG tablet  Commonly known as: LODINE     lisinopril 10 MG tablet  Commonly  known as: PRINIVIL;ZESTRIL     traZODone 100 MG tablet  Commonly known as: DESYREL               Where to Get Your Medications        Information about where to get these medications is not yet available    Ask your nurse or doctor about these medications  famotidine 20 MG tablet  ondansetron 4 MG tablet       3. '@DCFOLLOWUP' @  _______________________________    This note was partially transcribed via voice recognition software. Although efforts have been made to catch any discrepancies, it may contain sound alike words, grammatical errors, or nonsensical words.             Luvenia Redden, MD  11/03/21 2214

## 2021-10-30 NOTE — ED Notes (Signed)
Patient urine sent to lab.     Patient resting in bed. Food menu given to patient. Call light within reach. No further needs.      Janece Canterbury, RN  10/30/21 [phone]

## 2021-10-30 NOTE — ED Notes (Signed)
Security called to pt room to secure possessions, retrieved 2 bottles of vodka and a bottle of patron, various Rx medications  including controlled medications, All medications were sent to Pharmacy for safe storage and alcohol secured in plastic bag behind nurses station.      Vonzella Nipple, RN  10/30/21 [phone]

## 2021-10-31 LAB — EKG 12-LEAD
Atrial Rate: 86 {beats}/min
P Axis: 64 degrees
P-R Interval: 122 ms
Q-T Interval: 400 ms
QRS Duration: 84 ms
QTc Calculation (Bazett): 478 ms
R Axis: 67 degrees
T Axis: 53 degrees
Ventricular Rate: 86 {beats}/min

## 2022-02-18 ENCOUNTER — Emergency Department (HOSPITAL_COMMUNITY): Payer: Self-pay

## 2022-02-18 ENCOUNTER — Emergency Department (HOSPITAL_COMMUNITY)
Admission: EM | Admit: 2022-02-18 | Discharge: 2022-02-18 | Disposition: A | Discharge status: Home / Self Care | Payer: Self-pay | Attending: Emergency Medicine | Admitting: Emergency Medicine

## 2022-02-18 DIAGNOSIS — T40601A Poisoning by unspecified narcotics, accidental (unintentional), initial encounter: Secondary | ICD-10-CM

## 2022-02-18 DIAGNOSIS — R7309 Other abnormal glucose: Secondary | ICD-10-CM | POA: Insufficient documentation

## 2022-02-18 DIAGNOSIS — T402X1A Poisoning by other opioids, accidental (unintentional), initial encounter: Secondary | ICD-10-CM | POA: Insufficient documentation

## 2022-02-18 LAB — RAPID URINE DRUG SCREEN, HOSP PERFORMED
Amphetamines: POSITIVE — AB
Barbiturates: POSITIVE — AB
Benzodiazepines: POSITIVE — AB
Cocaine: POSITIVE — AB
Opiates: NOT DETECTED
Tetrahydrocannabinol: NOT DETECTED

## 2022-02-18 LAB — CBG MONITORING, ED: Glucose-Capillary: 111 mg/dL — ABNORMAL HIGH (ref 70–99)

## 2022-02-19 ENCOUNTER — Emergency Department (HOSPITAL_COMMUNITY)
Admission: EM | Admit: 2022-02-19 | Discharge: 2022-02-20 | Disposition: A | Discharge status: Home / Self Care | Payer: Self-pay | Attending: Emergency Medicine | Admitting: Emergency Medicine

## 2022-02-19 DIAGNOSIS — F10129 Alcohol abuse with intoxication, unspecified: Secondary | ICD-10-CM | POA: Insufficient documentation

## 2022-02-19 DIAGNOSIS — F10920 Alcohol use, unspecified with intoxication, uncomplicated: Secondary | ICD-10-CM

## 2022-02-19 DIAGNOSIS — Y907 Blood alcohol level of 200-239 mg/100 ml: Secondary | ICD-10-CM | POA: Insufficient documentation

## 2022-02-20 ENCOUNTER — Encounter (HOSPITAL_COMMUNITY): Payer: Self-pay | Admitting: Registered Nurse

## 2022-02-20 ENCOUNTER — Ambulatory Visit (HOSPITAL_COMMUNITY)
Admission: EM | Admit: 2022-02-20 | Discharge: 2022-02-20 | Disposition: A | Discharge status: Other Acute Inpatient Hospital | Payer: No Payment, Other | Attending: Registered Nurse | Admitting: Registered Nurse

## 2022-02-20 DIAGNOSIS — Z5900 Homelessness unspecified: Secondary | ICD-10-CM | POA: Insufficient documentation

## 2022-02-20 DIAGNOSIS — Z9151 Personal history of suicidal behavior: Secondary | ICD-10-CM | POA: Insufficient documentation

## 2022-02-20 DIAGNOSIS — F1910 Other psychoactive substance abuse, uncomplicated: Secondary | ICD-10-CM

## 2022-02-20 DIAGNOSIS — F1994 Other psychoactive substance use, unspecified with psychoactive substance-induced mood disorder: Secondary | ICD-10-CM

## 2022-02-20 DIAGNOSIS — R45851 Suicidal ideations: Secondary | ICD-10-CM | POA: Insufficient documentation

## 2022-02-20 LAB — COMPREHENSIVE METABOLIC PANEL
ALT: 22 U/L (ref 0–44)
AST: 35 U/L (ref 15–41)
Albumin: 4.2 g/dL (ref 3.5–5.0)
Alkaline Phosphatase: 62 U/L (ref 38–126)
Anion gap: 14 (ref 5–15)
BUN: 15 mg/dL (ref 6–20)
CO2: 22 mmol/L (ref 22–32)
Calcium: 8.4 mg/dL — ABNORMAL LOW (ref 8.9–10.3)
Chloride: 104 mmol/L (ref 98–111)
Creatinine, Ser: 0.71 mg/dL (ref 0.61–1.24)
GFR, Estimated: >60 mL/min (ref >60)
Glucose, Bld: 93 mg/dL (ref 70–99)
Potassium: 3.6 mmol/L (ref 3.5–5.1)
Sodium: 140 mmol/L (ref 135–145)
Total Bilirubin: 0.4 mg/dL (ref 0.3–1.2)
Total Protein: 7.1 g/dL (ref 6.5–8.1)

## 2022-02-20 LAB — CBC WITH DIFFERENTIAL/PLATELET
Abs Immature Granulocytes: 0.01 10*3/uL (ref 0.00–0.07)
Basophils Absolute: 0.1 10*3/uL (ref 0.0–0.1)
Basophils Relative: 1 %
Eosinophils Absolute: 0.2 10*3/uL (ref 0.0–0.5)
Eosinophils Relative: 4 %
HCT: 41.4 % (ref 39.0–52.0)
Hemoglobin: 13 g/dL (ref 13.0–17.0)
Immature Granulocytes: 0 %
Lymphocytes Relative: 34 %
Lymphs Abs: 2 10*3/uL (ref 0.7–4.0)
MCH: 33.4 pg (ref 26.0–34.0)
MCHC: 31.4 g/dL (ref 30.0–36.0)
MCV: 106.4 fL — ABNORMAL HIGH (ref 80.0–100.0)
Monocytes Absolute: 0.4 10*3/uL (ref 0.1–1.0)
Monocytes Relative: 7 %
Neutro Abs: 3.2 10*3/uL (ref 1.7–7.7)
Neutrophils Relative %: 54 %
Platelets: 204 10*3/uL (ref 150–400)
RBC: 3.89 MIL/uL — ABNORMAL LOW (ref 4.22–5.81)
RDW: 14.5 % (ref 11.5–15.5)
WBC: 5.8 10*3/uL (ref 4.0–10.5)
nRBC: 0 % (ref 0.0–0.2)

## 2022-02-20 LAB — ETHANOL: Alcohol, Ethyl (B): 202 mg/dL — ABNORMAL HIGH (ref <10)

## 2022-02-20 LAB — ACETAMINOPHEN LEVEL: Acetaminophen (Tylenol), Serum: <10 ug/mL — ABNORMAL LOW (ref 10–30)

## 2022-02-20 LAB — SALICYLATE LEVEL: Salicylate Lvl: <7 mg/dL — ABNORMAL LOW (ref 7.0–30.0)

## 2022-02-20 NOTE — Discharge Instructions (Signed)
Follow up with your primary doctor °

## 2022-02-20 NOTE — ED Notes (Signed)
Pt tolerating PO fluids

## 2022-02-20 NOTE — Discharge Instructions (Addendum)
Follow up with:   [facility] [address] [place], [place] [address] [phone] Available beds one at 9:00 PM and another at 11:00 PM.  Need to be on time.  If not there will miss bed.     Substance Abuse Treatment Programs  Intensive Outpatient Programs [facility]     [address]      [place], [place]                   [phone]       [facility] [address] [place], [place] [phone]  [facility]     (Inpatient and outpatient)     [address]           [phone]    [facility] [phone] (Suboxone and Methadone)  [address]      [place], [place] [address]      [phone]       [address] [place], [place] [phone]  [facility] (Outpatient/Inpatient, [facility])    (insurance only) [phone]             [facility] (Groups & Residential) [place], [place] [phone]     [facility]     [address]     [place], [place]      [phone]       [facility] (for caregivers and family) [phone] [address] [place], [place] [phone]      Residential Treatment Programs [facility]      [address] [place], [place] [address]  [phone]       [facility] [address] [place], [place] [address] [phone]  [facility] [place]        [phone]       [facility] [phone]  [facility] ([facility]             [address]                                         [place], [place]                                                [phone] or [phone]                               [facility] [place] [address] [place], [address] [phone]  [facility]    [address]      [place], [place]     [phone]       [facility] [address] [place],  [place] [phone]  [facility]   [address] [place], [place] [address]     [phone]      Admissions: 8am-3pm M-F  [facility] ([facility]) [address] [place], [place] [phone]  [facility]: Residential Program [phone] Days)   [place], [place]      [phone] or [phone]     [facility]: [facility] [place], [place] (Walk in Hours over the weekend or  by referral)  [facility] [address], [place], [place] [address] [phone]  Crisis Mobile: [facility]:  [phone] (for crisis response 24 hours a day) [facility] Hotline:      [phone] Outpatient Psychiatry and Counseling  [facility]: Mobile Crisis Management 24 hours:  [phone]  [facility] [employer] sliding scale fee and walk in schedule: M-F 8am-12pm/1pm-3pm [address]  [place], [place] [address] [phone]  [facility] [address] [place], [place] [address] [phone]  [facility] ([facility] [employer])- new patient walk-in appointments available Monday - Friday 8am -3pm.          [address] [place], [place] [address] [phone] or crisis line- [phone]  [facility]/ Intensive Outpatient Therapy Program [address] [place], [place] [address] [phone]  [facility]                  Crisis Services      [phone]     [place], [place] [address]                 [facility]   [facility] [phone]. [address] [place], [place] [address]   [facility]          [address]  [place], [place] [address]       [phone]  [facility] [address] [place], [place] [address] [phone]  [facility]    [address]    [place], [place] [address]     [phone]       Andee Poles, MD     [address]     [place] [address]     [phone]       [facility] [address] [place] [address]  [facility]     [address] [place], [place]      [phone]       [facility] Eulogio Ditch, MD [address] [place], [place] [address] [phone]  [facility]     [address]     [place], [place] [address]     [phone]       [facility] [address] [place], [place] [address] [phone] Resources for Temporary Residential Assistance/Crisis Centers  [facility] CENT[facility]) M-F 8am-3pm   [address] [place], [place] [address]   [phone] Services include: laundry, barbering, support groups, case management, phone  & computer access, showers, AA/NA mtgs, mental health/substance abuse nurse, job skills class, disability information, VA assistance, spiritual classes, etc.   HOMELESS SHELTERS  [facility]     [facility]   [address] [place]     [phone]              [employer] (women and children)       [address] [place], [place] [address] [phone] [email or web address] for application and process Application Required  [facility]   [address]    [place] [address]     [phone]  [employer] [facility] [address] [place], [place] [address] [phone] application appt.) Application Required  [facility] (women only)    [address]     [place], [place] [address]     [phone]      Intake starts 6pm daily Need valid ID, SSC, & Police report [employer] [address] [place], [place] [phone] Application  Required  [employer] (men only)     [address]      [place], [place]     [phone]       [facility] [place] (Pregnant women only) [address] [place], [place] [phone]  [facility]      [address]      [place], [place] [address]     [phone]             [facility] [address] [place], [place] [phone] 90 day commitment/SA/Application process  [facility](men only)     [address]     [place], [place]     [phone]       Check-in at [facility] [address] [place], [place] [address] [phone] Men/Women/Women and Children must be there by 7 pm  [facility] [place], [place] [phone]

## 2022-02-20 NOTE — ED Notes (Signed)
Discharge instructions including outpatient resources discussed with pt. Pt verbalized understanding with no questions at this time.   Pt called friend for ride home.

## 2022-02-20 NOTE — ED Provider Notes (Signed)
[facility]  HOSPITAL-EMERGENCY DEPT Provider Note   CSN: [id number] Arrival date & time: 02/19/22  2252     History  Chief Complaint  Patient presents with   Alcohol Intoxication    Bryan Harper is a 31 y.o. male.  Patient is a 31 year old male brought by EMS for evaluation of intoxication.  Patient is extremely somnolent and adds no additional history.  From what I am told, patient stopped drinking 2 days ago, then used crack cocaine this morning.  This evening he attempted to drink a beer, but could not keep it down and 911 was called.  Patient arrives here extremely somnolent and difficult to arouse.  He woke up and told the nurse his name, however will not wake up for me.  The history is provided by the patient.       Home Medications Prior to Admission medications   Medication Sig Start Date End Date Taking? Authorizing Provider  FLUoxetine (PROZAC) 20 MG capsule Take 1 capsule (20 mg total) by mouth daily. For depression 11/04/20   Armandina Stammer I, NP  gabapentin (NEURONTIN) 300 MG capsule Take 1 capsule (300 mg total) by mouth 3 (three) times daily. For alcohol withdrawal syndrome 11/03/20   Armandina Stammer I, NP  hydrOXYzine (ATARAX/VISTARIL) 25 MG tablet Take 1 tablet (25 mg total) by mouth 3 (three) times daily as needed for anxiety. 11/03/20   Armandina Stammer I, NP  traZODone (DESYREL) 50 MG tablet Take 1 tablet (50 mg total) by mouth at bedtime as needed for sleep. 11/03/20   Sanjuana Kava, NP      Allergies    Patient has no known allergies.    Review of Systems   Review of Systems  Unable to perform ROS: Mental status change    Physical Exam Updated Vital Signs BP (!) 152/89 (BP Location: Left Arm)   Pulse (!) 106   Temp 98 F (36.7 C) (Axillary)   Resp 17   SpO2 92%  Physical Exam Vitals and nursing note reviewed.  Constitutional:      General: He is not in acute distress.    Appearance: He is well-developed. He is not diaphoretic.      Comments: Patient is very somnolent and difficult to arouse.  HENT:     Head: Normocephalic and atraumatic.  Cardiovascular:     Rate and Rhythm: Normal rate and regular rhythm.     Heart sounds: No murmur heard.    No friction rub.  Pulmonary:     Effort: Pulmonary effort is normal. No respiratory distress.     Breath sounds: Normal breath sounds. No wheezing or rales.  Abdominal:     General: Bowel sounds are normal. There is no distension.     Palpations: Abdomen is soft.     Tenderness: There is no abdominal tenderness.  Musculoskeletal:        General: Normal range of motion.     Cervical back: Normal range of motion and neck supple.  Skin:    General: Skin is warm and dry.  Neurological:     Mental Status: He is oriented to person, place, and time.     Coordination: Coordination normal.     ED Results / Procedures / Treatments   Labs (all labs ordered are listed, but only abnormal results are displayed) Labs Reviewed  COMPREHENSIVE METABOLIC PANEL  ETHANOL  CBC WITH DIFFERENTIAL/PLATELET  URINALYSIS, ROUTINE W REFLEX MICROSCOPIC  RAPID URINE DRUG SCREEN, HOSP PERFORMED    EKG  None  Radiology CT Head Wo Contrast  Result Date: 02/18/2022 CLINICAL DATA:  Facial trauma, blunt. AMS brings patient in for possible overdose. EXAM: CT HEAD WITHOUT CONTRAST CT MAXILLOFACIAL WITHOUT CONTRAST TECHNIQUE: Multidetector CT imaging of the head and maxillofacial structures were performed using the standard protocol without intravenous contrast. Multiplanar CT image reconstructions of the maxillofacial structures were also generated. RADIATION DOSE REDUCTION: This exam was performed according to the departmental dose-optimization program which includes automated exposure control, adjustment of the mA and/or kV according to patient size and/or use of iterative reconstruction technique. COMPARISON:  None Available. FINDINGS: CT HEAD FINDINGS Brain: No evidence of acute infarction,  hemorrhage, hydrocephalus, extra-axial collection or mass lesion/mass effect. Vascular: No hyperdense vessel or unexpected calcification. Skull: Normal. Negative for fracture or focal lesion. Other: None. CT MAXILLOFACIAL FINDINGS Osseous: No fracture or mandibular dislocation. No destructive process. Orbits: Negative. No traumatic or inflammatory finding. Sinuses: Clear. Soft tissues: Negative. IMPRESSION: 1.  No acute intracranial abnormality. 2. No evidence of maxillofacial fracture or significant soft tissue injury. Orbits are unremarkable. Electronically Signed   By: Larose Hires D.O.   On: 02/18/2022 18:40   CT Maxillofacial Wo Contrast  Result Date: 02/18/2022 CLINICAL DATA:  Facial trauma, blunt. AMS brings patient in for possible overdose. EXAM: CT HEAD WITHOUT CONTRAST CT MAXILLOFACIAL WITHOUT CONTRAST TECHNIQUE: Multidetector CT imaging of the head and maxillofacial structures were performed using the standard protocol without intravenous contrast. Multiplanar CT image reconstructions of the maxillofacial structures were also generated. RADIATION DOSE REDUCTION: This exam was performed according to the departmental dose-optimization program which includes automated exposure control, adjustment of the mA and/or kV according to patient size and/or use of iterative reconstruction technique. COMPARISON:  None Available. FINDINGS: CT HEAD FINDINGS Brain: No evidence of acute infarction, hemorrhage, hydrocephalus, extra-axial collection or mass lesion/mass effect. Vascular: No hyperdense vessel or unexpected calcification. Skull: Normal. Negative for fracture or focal lesion. Other: None. CT MAXILLOFACIAL FINDINGS Osseous: No fracture or mandibular dislocation. No destructive process. Orbits: Negative. No traumatic or inflammatory finding. Sinuses: Clear. Soft tissues: Negative. IMPRESSION: 1.  No acute intracranial abnormality. 2. No evidence of maxillofacial fracture or significant soft tissue injury.  Orbits are unremarkable. Electronically Signed   By: Larose Hires D.O.   On: 02/18/2022 18:40    Procedures Procedures    Medications Ordered in ED Medications - No data to display  ED Course/ Medical Decision Making/ A&P  Patient brought here for evaluation of alcohol intoxication.  Patient arrives somnolent and difficult to arouse.  He has been observed for several hours and is now awake and alert and seems appropriate for discharge.  Final Clinical Impression(s) / ED Diagnoses Final diagnoses:  None    Rx / DC Orders ED Discharge Orders     None         Geoffery Lyons, MD 02/20/22 [phone]

## 2022-02-20 NOTE — ED Notes (Signed)
Pt was awaken and instructed to move himself up in bed as his feet where falling off of the end of the stretcher. Pt was able to follow instructions. Groans, but does not answer any questions for staff.

## 2022-02-20 NOTE — ED Provider Notes (Signed)
Behavioral Health Urgent Care Medical Screening Exam  Patient Name: Bryan Harper MRN: [id number] Date of Evaluation: 02/20/22 Chief Complaint:   Diagnosis:  Final diagnoses:  Polysubstance abuse (HCC)  Substance induced mood disorder (HCC)    History of Present illness: Bryan Harper is a 31 y.o. male patient with psychiatric history of polysubstance abuse and substance induced mood disorder presented to [facility] voluntary as a walk in requesting assistance with alcohol and drug detox and rehab program  [name], 31 y.o., male patient seen face to face by this provider, consulted with Dr. Earlene Plater; and chart reviewed on 02/20/22.  On evaluation Bryan Harper reports he has been to the hospital several times trying to get into a detox program but "I'm always turned away."  Patient states he is from [place].  "I was in a hospital in [place], [place] (ED) for about a week and then got sent to another hospital (psychiatric hospitalization) for about week.  After that I went to a program for about 2 month program and got out about a month ago.  I was on my way home but got stuck in [place] and I've been homeless for about 2-3 weeks."  Patient states he has been using drugs and alcohol but wants to get clean and get back home.  States that he started having suicidal thoughts today related to "I was told if I went to the [employer] that they may be able to help me get a bus ticket and get back home but when I got there they said they couldn't help me."  Patient states he does have a history of prior suicide attempt "That's what I was in the hospital for last time.  I tried to hang myself and my friend stopped me."  Patient states he is able to contract for safety if he is able to get into a detox program or get back home.  Patient also reports he has a history of seizure withdrawal with alcohol detox.  States last seizure was 2-3 weeks ago.  Reports that during last  hospital admission he was started on Wellbutrin, Lexapro, Seroquel, and Zyprexa unsure of dosages and the last time he took was while in hospital.  States he has no outpatient psychiatric services.  Patient denies homicidal ideation, psychosis, and paranoia.   During evaluation Bryan Harper is found in lobby sleeping.  Once waken he was brought back to assessment room.  Currently he is sitting upright in chair with no noted distress.  He is dressed appropriately for weather but Disheveled (clothing, skin, nail) dirty.  He is alert/oriented x 4; calm and cooperative.  His mood is dysphoric with congruent affect.  He is speaking in a clear tone at moderate volume, and normal pace; with good eye contact.  His thought process is coherent and relevant; There is no indication that he is currently responding to internal/external stimuli or experiencing delusional thought content.  He denies homicidal ideation, psychosis, and paranoia; however he is endorsing suicidal ideation with no intent or plan that is conditional that he is able to get home or get into detox program.  He is education on admission to a detox program not accepting individuals that are suicidal related to unable to do 1:1 observation for safety.  States "I wouldn't want to kill myself if I was in a program or if I could get home."  Patient named 2 detox/rehab facilities that is close to his home town [place] [place],  [place] [facility] [employer] in [place], [place])  Informed would have social work to contact to see if any available beds.       Psychiatric Specialty Exam  Presentation  General Appearance:Appropriate for Environment; Disheveled  Eye Contact:Good  Speech:Clear and Coherent; Normal Rate  Speech Volume:Normal  Handedness:Right   Mood and Affect  Mood:Dysphoric  Affect:Congruent   Thought Process  Thought Processes:Coherent; Goal Directed  Descriptions of  Associations:Intact  Orientation:Full (Time, Place and Person)  Thought Content:Logical    Hallucinations:None  Ideas of Reference:None  Suicidal Thoughts:Yes, Passive Without Intent; Without Plan (Stating suicidal thoughts related to not being able to get back home)  Homicidal Thoughts:No   Sensorium  Memory:Immediate Good; Recent Good; Remote Good  Judgment:Intact  Insight:Present   Executive Functions  Concentration:Good  Attention Span:Good  Recall:Good  Fund of Knowledge:Good  Language:Good   Psychomotor Activity  Psychomotor Activity:Normal   Assets  Assets:Communication Skills; Desire for Improvement; Social Support   Sleep  Sleep:Good  Number of hours: No data recorded  Nutritional Assessment (For OBS and FBC admissions only) Has the patient had a weight loss or gain of 10 pounds or more in the last 3 months?: No Has the patient had a decrease in food intake/or appetite?: No Does the patient have dental problems?: No Does the patient have eating habits or behaviors that may be indicators of an eating disorder including binging or inducing vomiting?: No Has the patient recently lost weight without trying?: 0 Has the patient been eating poorly because of a decreased appetite?: 0 Malnutrition Screening Tool Score: 0    Physical Exam: Physical Exam Vitals and nursing note reviewed. Exam conducted with a chaperone present.  Constitutional:      General: He is not in acute distress.    Appearance: Normal appearance. He is not ill-appearing.  Cardiovascular:     Rate and Rhythm: Normal rate.  Pulmonary:     Effort: Pulmonary effort is normal.  Skin:    General: Skin is warm and dry.  Neurological:     Mental Status: He is alert and oriented to person, place, and time.  Psychiatric:        Attention and Perception: Attention and perception normal. He does not perceive auditory or visual hallucinations.        Mood and Affect: Mood is  depressed.        Speech: Speech normal.        Behavior: Behavior normal. Behavior is cooperative.        Thought Content: Thought content is not paranoid or delusional. Thought content does not include homicidal ideation. Suicidal: endorses suicidal ideation with no intent or plan.  SI is condictional.       Cognition and Memory: Cognition and memory normal.        Judgment: Judgment is impulsive.    Review of Systems  Constitutional: Negative.   HENT: Negative.    Eyes: Negative.   Respiratory: Negative.    Cardiovascular: Negative.   Gastrointestinal: Negative.   Genitourinary: Negative.   Musculoskeletal: Negative.   Skin: Negative.   Neurological: Negative.   Endo/Heme/Allergies: Negative.   Psychiatric/Behavioral:  Positive for depression and substance abuse (Polysubstance use). Hallucinations: Denies. Suicidal ideas: Endorsing suicidal ideation with no intent or plan and condictional wanting to get into a detox program or get back home to [place], [place].The patient is nervous/anxious. The patient does not have insomnia.        Patient stating that he has been homeless  for 2-3 weeks after leaving a program in [place], [place] and getting stuck in [place].  States he has been trying to get into a detox program or get back home.  Became suicidal today after he was told that [employer] couldn't help with a bus ticket to get him back home.    Blood pressure (!) 152/92, pulse 84, temperature 98.5 F (36.9 C), temperature source Oral, resp. rate 18, SpO2 100 %. There is no height or weight on file to calculate BMI.  Musculoskeletal: Strength & Muscle Tone: within normal limits Gait & Station: normal Patient leans: N/A   [place] [facility] Discharge Disposition for Follow up and Recommendations: Based on my evaluation the patient does not appear to have an emergency medical condition and can be discharged with resources and follow up care in outpatient services for Medication Management,  Substance Abuse Intensive Outpatient Program, Individual Therapy, and Group Therapy   Available bed at [facility] at 9:00 PM today.  Arranging transportation to get patient there  [place] Zaniel Marineau, NP 02/20/2022, 3:06 PM

## 2022-02-20 NOTE — BH Assessment (Addendum)
Comprehensive Clinical Assessment (CCA) Screening, Triage and Referral Note  02/20/2022 Bryan Harper [id number]  Triage/Screening complete. Patient is noted as Urgent. Disposition pending the providers assessment.   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 Self  What Is the Reason for Your Visit/Call Today?  Bryan Harper is a 31 y/o male that present to the [facility] urgent Care. He is requesting alcohol detox. Drinks daily, 1/2 gallon of liquor daily. He started using alcohol heavily at the age  of 31 yrs old.  Last drink was yesterday. Patient appears intoxicated during today's assessment. He has a history of alcohol induced seizures. Last seizure was 2-3 weeks ago.     Denies current drug use. However, says that the last UDS was + for meth and crack cocaine. Patient has no knowledge of using these drugs.     Patient endorses current suicidal ideations with a plan to "jump off a bridge". Denies acces to weapons/guns. He has tried to commit suicide 7-8 times in the past by intentionally overdosing, jumping in front of cars, and hanging self. Last suicide attempt was 3 weeks ago he tried to hang himself. Denies homicidal ideations. However, says that he is getting into a lot of fights with people. Denies AVH's.   How Long Has This Been Causing You Problems? > than 6 months  What Do You Feel Would Help You the Most Today? Alcohol or Drug Use Treatment; Treatment for Depression or other mood problem; Medication(s)   Have You Recently Had Any Thoughts About Hurting Yourself? Yes  Are You Planning to Commit Suicide/Harm Yourself At This time? Yes   Have you Recently Had Thoughts About Hurting Someone [name]? No  Are You Planning to Harm Someone at This Time?  No  Explanation: No data recorded  Have You Used Any Alcohol or Drugs in the Past 24 Hours? Yes  How Long Ago Did You Use Drugs or Alcohol? No data recorded What Did You Use and How Much? No data recorded  Do You Currently Have a Therapist/Psychiatrist? No data recorded Name of Therapist/Psychiatrist: No data recorded  Have You Been Recently Discharged From Any Office Practice or Programs? No data recorded Explanation of Discharge From Practice/Program: No data recorded   CCA Screening Triage Referral Assessment Type of Contact: No data recorded Telemedicine Service Delivery:   Is this Initial or Reassessment? No data recorded Date Telepsych consult ordered in [facility]:  No data recorded Time Telepsych consult ordered in [facility]:  No data recorded Location of Assessment: No data recorded Provider Location: No data recorded  Collateral Involvement: No data recorded  Does Patient Have a Court Appointed Legal Guardian? No data recorded Name and Contact of Legal Guardian: No data recorded If Minor and Not Living with Parent(s), Who has Custody? No data recorded Is CPS involved or ever been involved? No data recorded Is APS involved or ever been involved? No data recorded  Patient Determined To Be At Risk for Harm To Self or Others Based on Review of Patient Reported Information or Presenting Complaint? No data recorded Method: No data recorded Availability of Means: No data recorded Intent: No data recorded Notification Required: No data recorded Additional Information for Danger to Others Potential: No data recorded Additional Comments for Danger to Others Potential: No data recorded Are There Guns or Other Weapons in Your Home?  No data recorded Types of Guns/Weapons: No data recorded Are These Weapons Safely Secured?                            No data recorded Who Could Verify You Are Able To Have These Secured: No data recorded Do You Have any Outstanding Charges, Pending Court  Dates, Parole/Probation? No data recorded Contacted To Inform of Risk of Harm To Self or Others: No data recorded  Does Patient Present under Involuntary Commitment? No data recorded IVC Papers Initial File Date: No data recorded  [place] of Residence: No data recorded  Patient Currently Receiving the Following Services: No data recorded  Determination of Need: Routine (7 days)   Options For Referral: Inpatient Hospitalization; Chemical Dependency Intensive Outpatient Therapy (CDIOP); Facility-Based Crisis; Medication Management   Discharge Disposition:     Melynda Ripple, Counselor

## 2022-11-04 IMAGING — MR MR CERVICAL SPINE W/O CM
5 of 7 series · 37 of 48 positions shown · non-contrast
Comparison: Same day CT exams

CLINICAL DATA: Left-sided weakness.

EXAM:
MRI HEAD WITHOUT CONTRAST
MRI CERVICAL SPINE WITHOUT CONTRAST
TECHNIQUE: Multiplanar, multiecho pulse sequences of the brain and surrounding
structures, and cervical spine, to include the craniocervical
junction and cervicothoracic junction, were obtained without
intravenous contrast.

[Series 10: T2 · sagittal · 3.0mm · 0.69mm/px · 5 of 15 slices shown (1 of 2)]
[im 1/15]
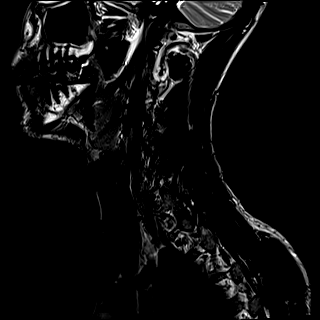
[im 4/15]
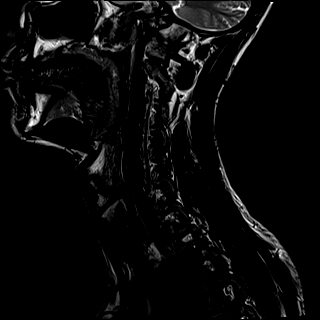
[im 8/15]
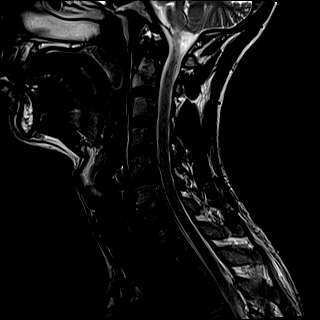
[im 11/15]
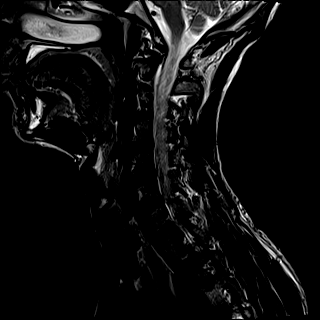
[im 15/15]
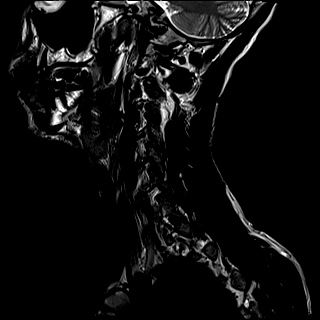

[Series 11: T1 · sagittal · 3.0mm · 0.69mm/px · 5 of 15 slices shown]
[im 1/15]
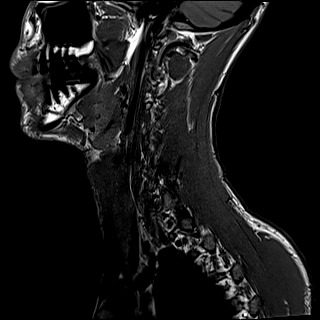
[im 4/15]
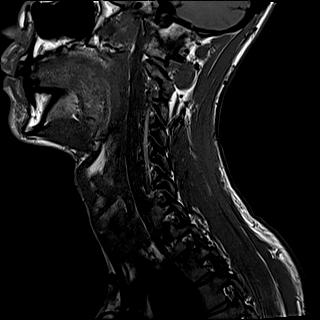
[im 8/15]
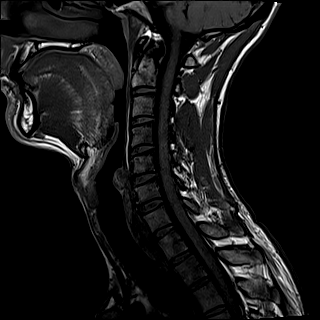
[im 11/15]
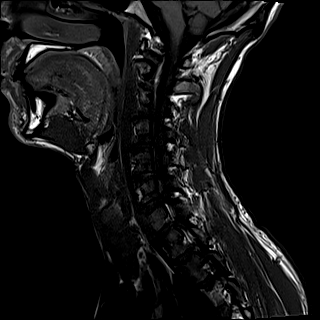
[im 15/15]
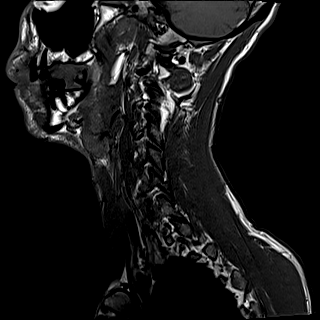

[Series 12: STIR · sagittal · 3.0mm · 0.86mm/px · 5 of 15 slices shown]
[im 1/15]
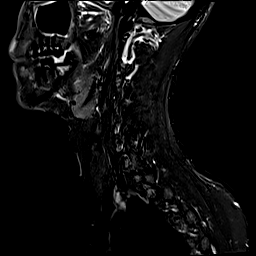
[im 4/15]
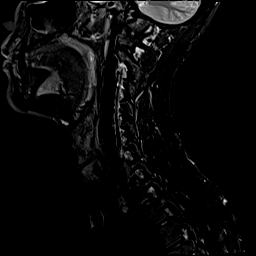
[im 8/15]
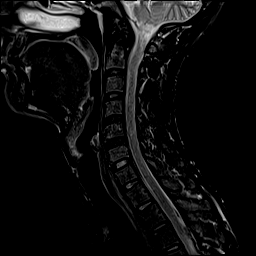
[im 11/15]
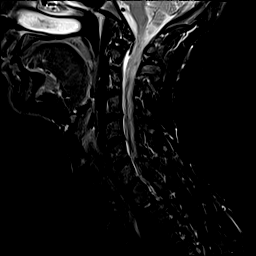
[im 15/15]
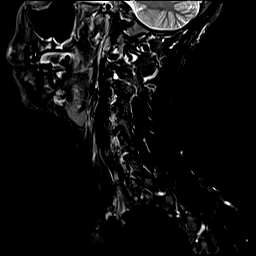

[Series 13: T2 · axial · 3.0mm · 0.70mm/px · z∈[-199,-55]mm · 13 of 42 slices shown (2 of 2)]
[im 1/42]
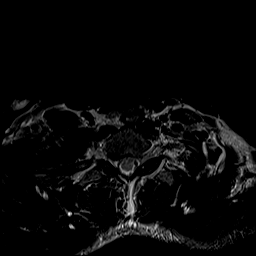
[im 4/42]
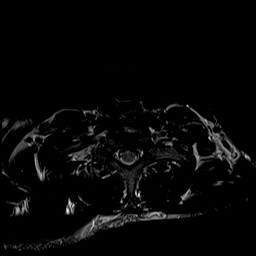
[im 7/42]
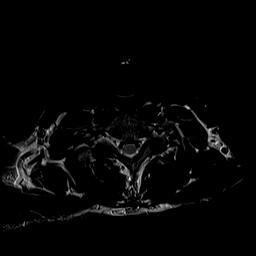
[im 11/42]
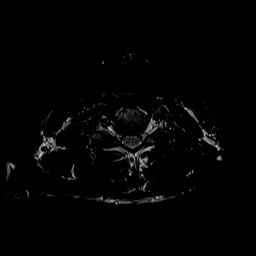
[im 14/42]
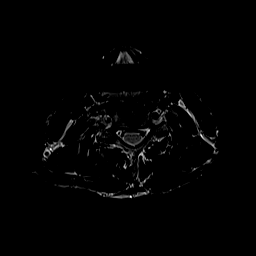
[im 18/42]
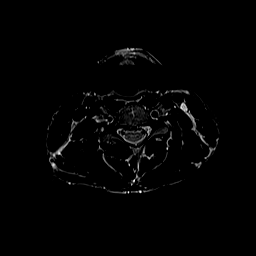
[im 21/42]
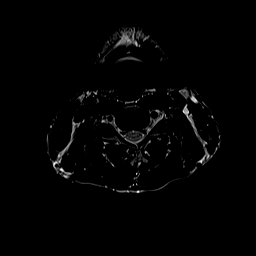
[im 24/42]
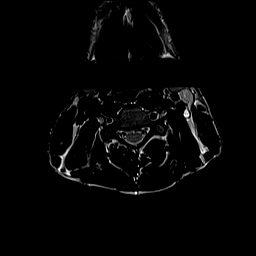
[im 28/42]
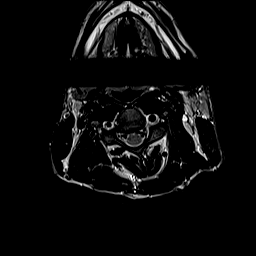
[im 31/42]
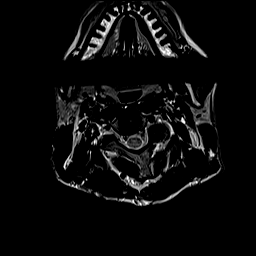
[im 35/42]
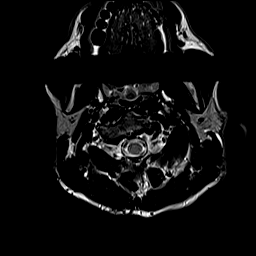
[im 38/42]
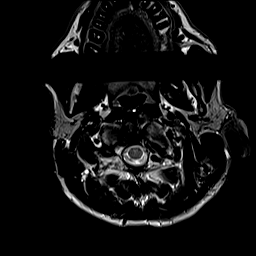
[im 42/42]
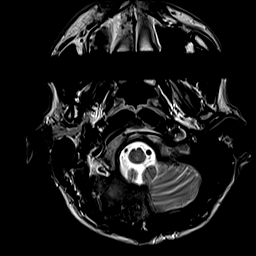

[Series 14: GRE · axial · 3.0mm · 0.35mm/px · z∈[-199,-55]mm · 9 of 42 slices shown]
[im 1/42]
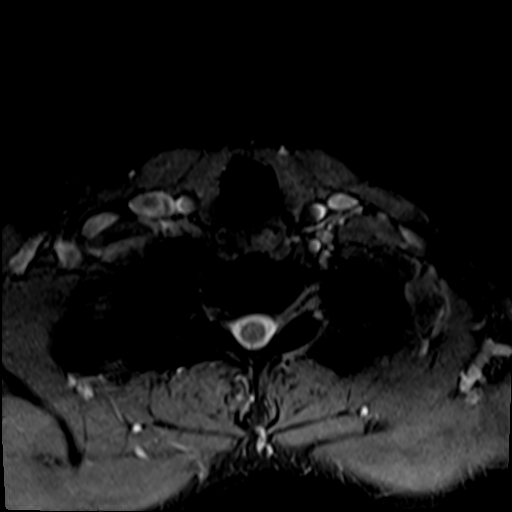
[im 4/42]
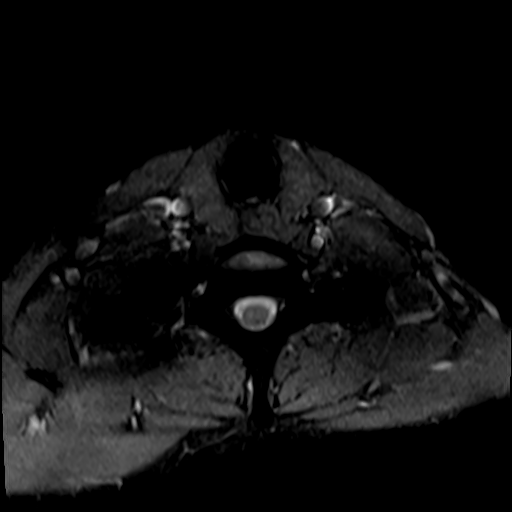
[im 7/42]
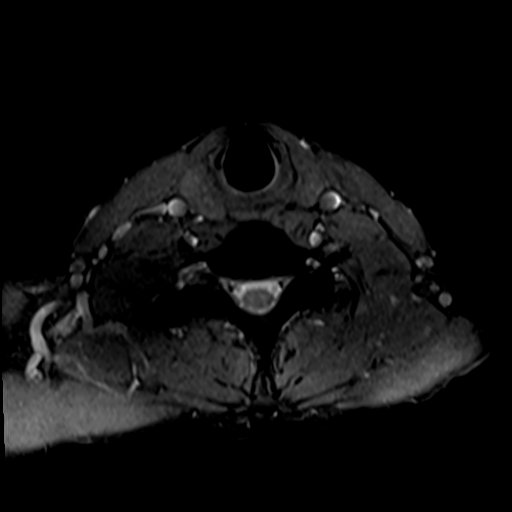
[im 14/42]
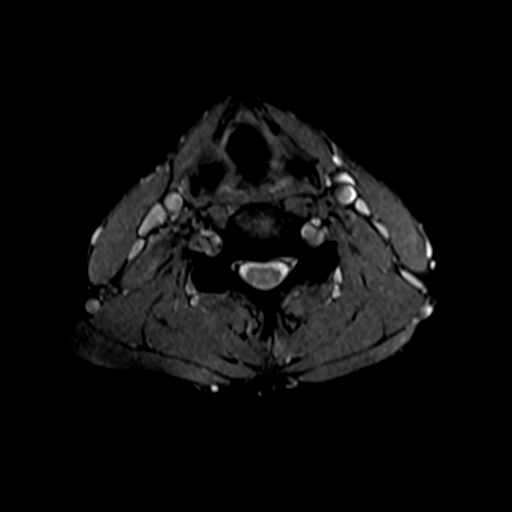
[im 18/42]
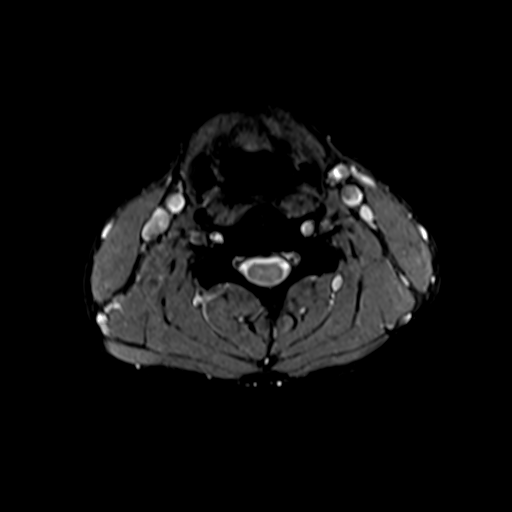
[im 24/42]
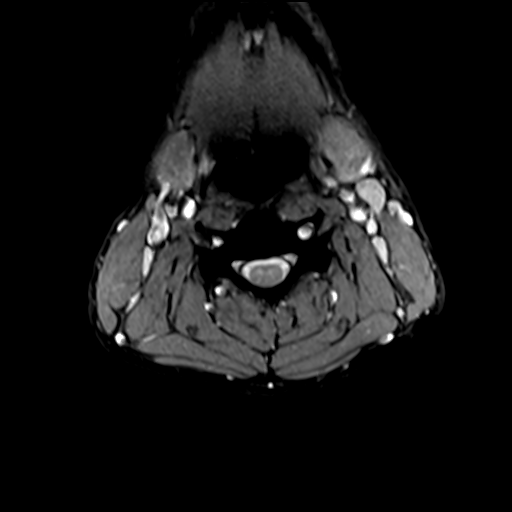
[im 28/42]
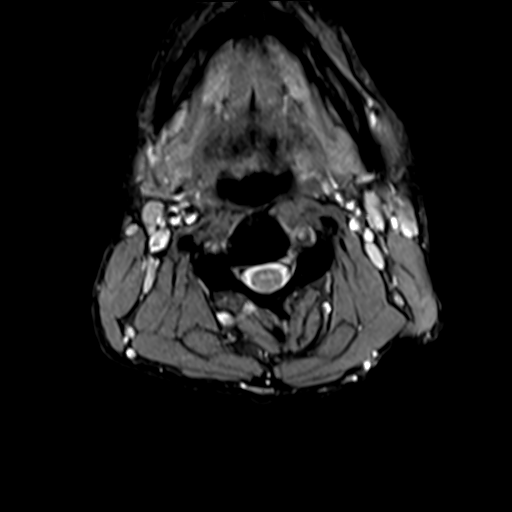
[im 35/42]
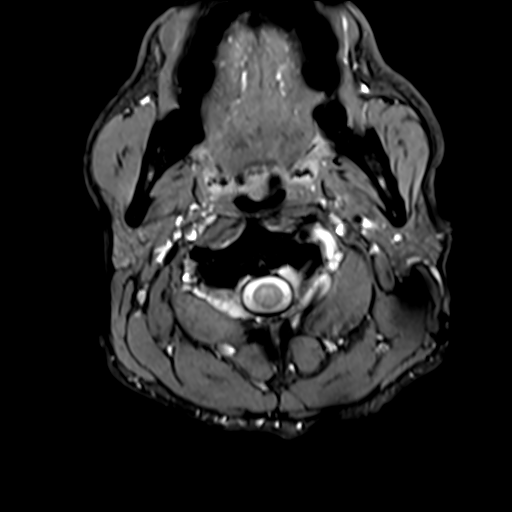
[im 42/42]
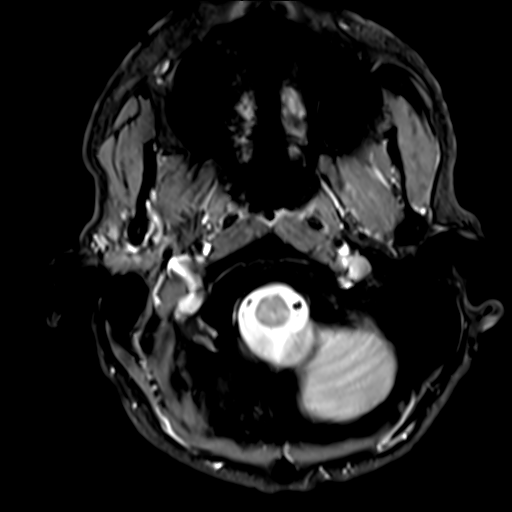

[37 of 48 positions shown; findings below may reference images not displayed]

FINDINGS: MRI HEAD FINDINGS

Brain: No acute infarction, hemorrhage, hydrocephalus, extra-axial
collection or mass lesion. There are a few punctate T2/FLAIR
hyperintensities within the white matter, within normal limits for
age and most likely related to chronic microvascular ischemic
disease.

Vascular: Major arterial flow voids are maintained at the skull
base.

Skull: Normal marrow signal.

Sinuses/Orbits: No substantial sinus disease.  Unremarkable orbits.

Other: No mastoid effusions.

MRI CERVICAL SPINE FINDINGS

Alignment: Normal.

Vertebrae: Vertebral body heights are maintained. No specific
evidence of acute fracture, discitis/osteomyelitis, or suspicious
bone lesion. Mild retroflexion of the dens.

Cord: Normal cord signal.

Posterior Fossa, vertebral arteries, paraspinal tissues: Negative.

Disc levels:

Short pedicles throughout the cervical spine.

C2-C3: No significant disc protrusion, foraminal stenosis, or canal
stenosis.

C3-C4: Mild facet/uncovertebral hypertrophy without significant
canal or foraminal stenosis.

C4-C5: Small posterior disc osteophyte complex and mild right
facet/uncovertebral hypertrophy with resulting mild right foraminal
stenosis.

C5-C6: Small posterior disc osteophyte complex and mild right-sided
facet/uncovertebral hypertrophy. Mild right foraminal stenosis
without significant canal stenosis.

C6-C7: No significant disc protrusion, foraminal stenosis, or canal
stenosis.

C7-T1: No significant disc protrusion, foraminal stenosis, or canal
stenosis.
IMPRESSION: 1. No evidence of acute intracranial abnormality.  No acute infarct.
2. Mild multilevel degenerative change with mild right foraminal
stenosis at C4-C5 and C5-C6.
3. Congenitally short pedicles without significant canal stenosis.

## 2022-11-04 IMAGING — MR MR HEAD W/O CM
14 of 17 series · 45 of 48 positions shown · non-contrast
Comparison: Same day CT exams

CLINICAL DATA: Left-sided weakness.

EXAM:
MRI HEAD WITHOUT CONTRAST
MRI CERVICAL SPINE WITHOUT CONTRAST
TECHNIQUE: Multiplanar, multiecho pulse sequences of the brain and surrounding
structures, and cervical spine, to include the craniocervical
junction and cervicothoracic junction, were obtained without
intravenous contrast.

[Series 5: DWI · axial · 3.0mm · 1.36mm/px · z∈[-56,+85]mm · 5 of 96 slices shown (1 of 4)]
[im 1/96]
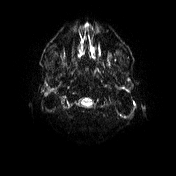
[im 24/96]
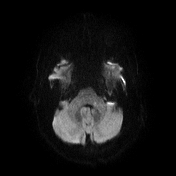
[im 48/96]
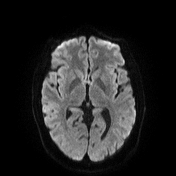
[im 72/96]
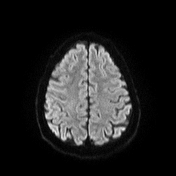
[im 96/96]
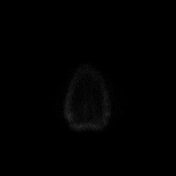

[Series 6: DWI · axial · 3.0mm · 1.36mm/px · z∈[-56,+85]mm · 2 of 47 slices shown (2 of 4)]
[im 1/47]
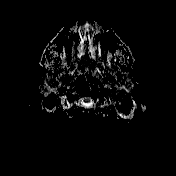
[im 47/47]
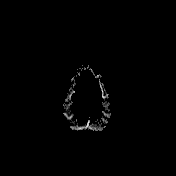

[Series 7: T1 · sagittal · 5.0mm · 0.75mm/px · 1 of 24 slices shown (1 of 2)]
[im 1/24]
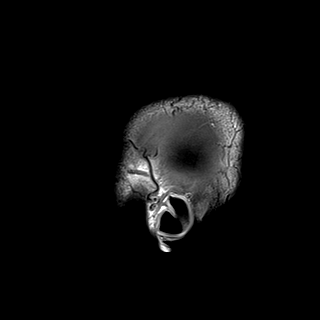

[Series 8: T2 · axial · 5.0mm · 0.62mm/px · z∈[-70,+92]mm · 2 of 26 slices shown (1 of 3)]
[im 1/26]
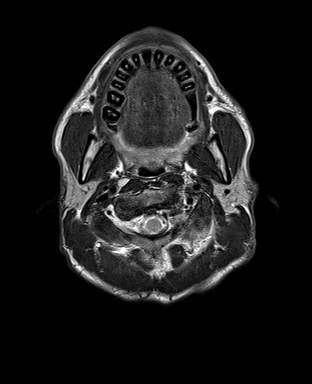
[im 26/26]
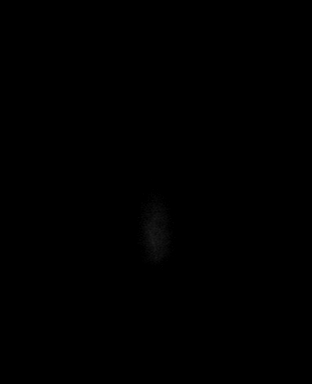

[Series 9: mip_images(sw) · axial · 24.0mm · 0.75mm/px · z∈[-55,+77]mm · 3 of 45 slices shown]
[im 1/45]
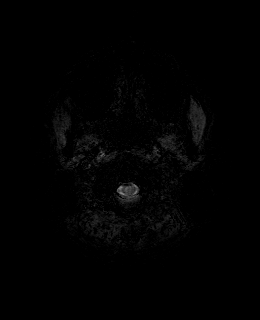
[im 23/45]
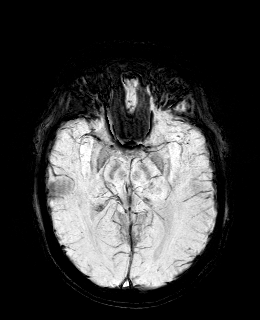
[im 45/45]
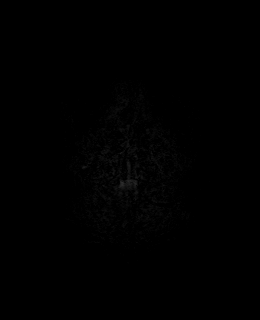

[Series 10: swi_images · axial · 3.0mm · 0.75mm/px · z∈[-65,+88]mm · 3 of 52 slices shown]
[im 1/52]
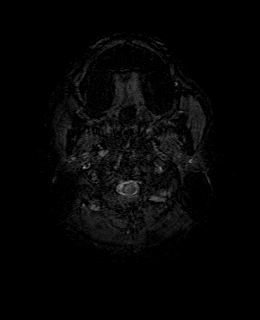
[im 26/52]
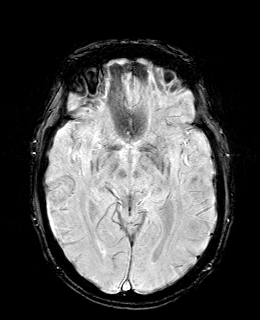
[im 52/52]
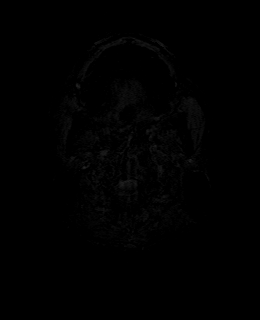

[Series 11: FLAIR · axial · 3.0mm · 0.75mm/px · z∈[-65,+88]mm · 3 of 52 slices shown]
[im 1/52]
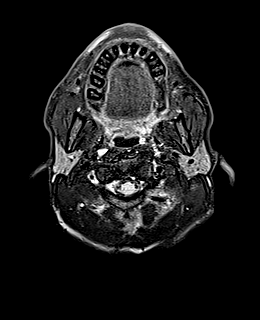
[im 26/52]
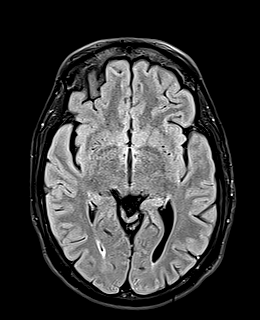
[im 52/52]
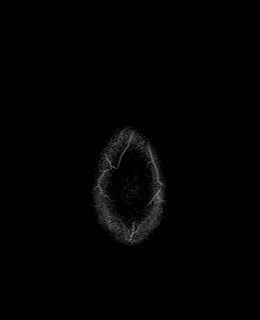

[Series 12: T1 · axial · 1.0mm · 0.94mm/px · z∈[-66,+76]mm · 8 of 144 slices shown (2 of 2)]
[im 1/144]
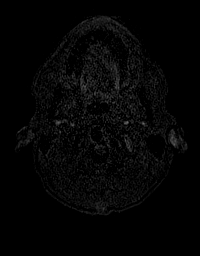
[im 21/144]
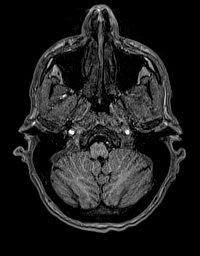
[im 41/144]
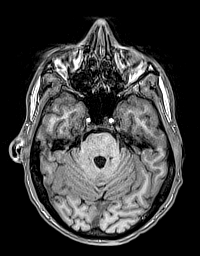
[im 62/144]
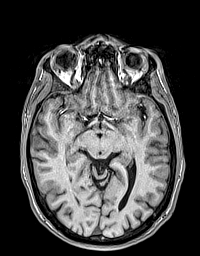
[im 82/144]
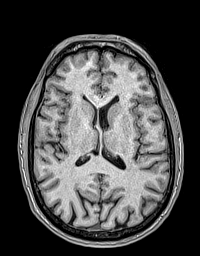
[im 103/144]
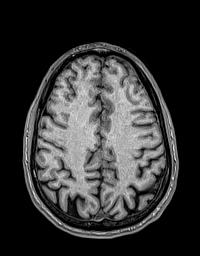
[im 123/144]
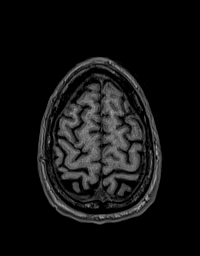
[im 144/144]
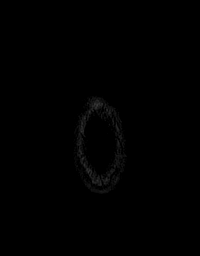

[Series 13: DWI · coronal · 5.0mm · 1.31mm/px · 5 of 80 slices shown (3 of 4)]
[im 1/80]
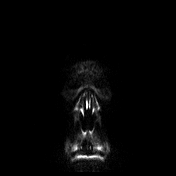
[im 20/80]
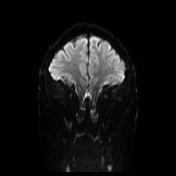
[im 40/80]
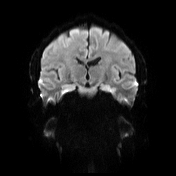
[im 60/80]
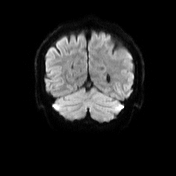
[im 80/80]
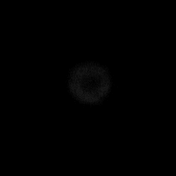

[Series 14: DWI · coronal · 5.0mm · 1.31mm/px · 2 of 40 slices shown (4 of 4)]
[im 1/40]
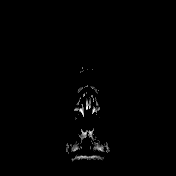
[im 40/40]
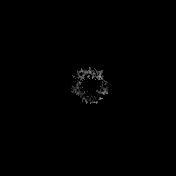

[Series 15: T2 · coronal · 5.0mm · 0.57mm/px · 2 of 39 slices shown (2 of 3)]
[im 1/39]
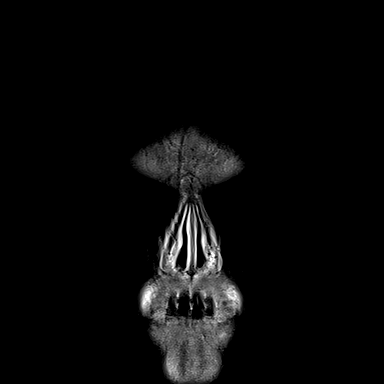
[im 39/39]
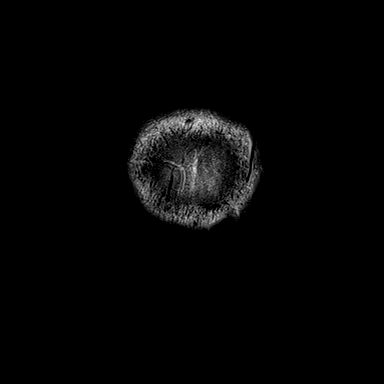

[Series 16: T2 · coronal · 5.0mm · 0.69mm/px · 2 of 39 slices shown (3 of 3)]
[im 1/39]
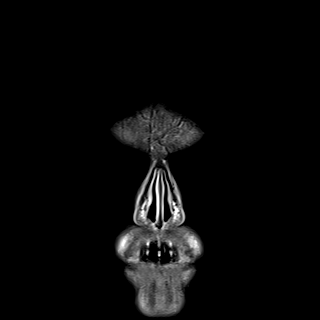
[im 39/39]
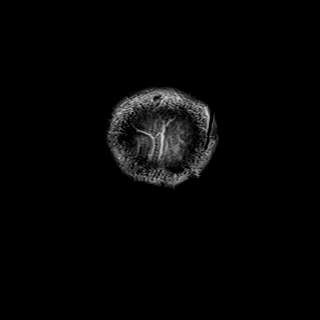

[Series 17: resolve_(id)_trace_cor_tracew · coronal · 5.0mm · 1.53mm/px · 5 of 80 slices shown]
[im 1/80]
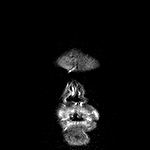
[im 20/80]
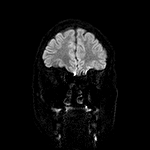
[im 40/80]
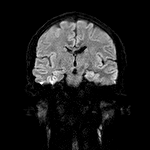
[im 60/80]
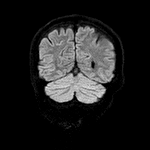
[im 80/80]
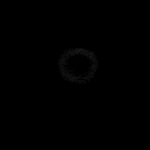

[Series 18: resolve_(id)_trace_cor_adc · coronal · 5.0mm · 1.53mm/px · 2 of 39 slices shown]
[im 1/39]
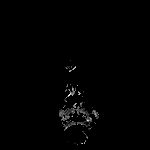
[im 39/39]
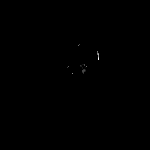

[45 of 48 positions shown; findings below may reference images not displayed]

FINDINGS: MRI HEAD FINDINGS

Brain: No acute infarction, hemorrhage, hydrocephalus, extra-axial
collection or mass lesion. There are a few punctate T2/FLAIR
hyperintensities within the white matter, within normal limits for
age and most likely related to chronic microvascular ischemic
disease.

Vascular: Major arterial flow voids are maintained at the skull
base.

Skull: Normal marrow signal.

Sinuses/Orbits: No substantial sinus disease.  Unremarkable orbits.

Other: No mastoid effusions.

MRI CERVICAL SPINE FINDINGS

Alignment: Normal.

Vertebrae: Vertebral body heights are maintained. No specific
evidence of acute fracture, discitis/osteomyelitis, or suspicious
bone lesion. Mild retroflexion of the dens.

Cord: Normal cord signal.

Posterior Fossa, vertebral arteries, paraspinal tissues: Negative.

Disc levels:

Short pedicles throughout the cervical spine.

C2-C3: No significant disc protrusion, foraminal stenosis, or canal
stenosis.

C3-C4: Mild facet/uncovertebral hypertrophy without significant
canal or foraminal stenosis.

C4-C5: Small posterior disc osteophyte complex and mild right
facet/uncovertebral hypertrophy with resulting mild right foraminal
stenosis.

C5-C6: Small posterior disc osteophyte complex and mild right-sided
facet/uncovertebral hypertrophy. Mild right foraminal stenosis
without significant canal stenosis.

C6-C7: No significant disc protrusion, foraminal stenosis, or canal
stenosis.

C7-T1: No significant disc protrusion, foraminal stenosis, or canal
stenosis.
IMPRESSION: 1. No evidence of acute intracranial abnormality.  No acute infarct.
2. Mild multilevel degenerative change with mild right foraminal
stenosis at C4-C5 and C5-C6.
3. Congenitally short pedicles without significant canal stenosis.
